# Patient Record
Sex: Female | Born: 1955 | Race: White | Hispanic: No | Marital: Married | Smoking: Former smoker
Health system: Southern US, Community
[De-identification: ages and names within clinical notes are randomized; demographics above are authoritative.]

## PROBLEM LIST (undated history)

## (undated) DIAGNOSIS — Z8679 Personal history of other diseases of the circulatory system: Secondary | ICD-10-CM

## (undated) DIAGNOSIS — Z960 Presence of urogenital implants: Secondary | ICD-10-CM

## (undated) DIAGNOSIS — G4733 Obstructive sleep apnea (adult) (pediatric): Secondary | ICD-10-CM

## (undated) DIAGNOSIS — J449 Chronic obstructive pulmonary disease, unspecified: Secondary | ICD-10-CM

## (undated) DIAGNOSIS — N393 Stress incontinence (female) (male): Secondary | ICD-10-CM

## (undated) DIAGNOSIS — Z9989 Dependence on other enabling machines and devices: Secondary | ICD-10-CM

## (undated) DIAGNOSIS — N189 Chronic kidney disease, unspecified: Secondary | ICD-10-CM

## (undated) DIAGNOSIS — Z8669 Personal history of other diseases of the nervous system and sense organs: Secondary | ICD-10-CM

## (undated) DIAGNOSIS — I10 Essential (primary) hypertension: Secondary | ICD-10-CM

## (undated) DIAGNOSIS — R011 Cardiac murmur, unspecified: Secondary | ICD-10-CM

## (undated) DIAGNOSIS — Z87442 Personal history of urinary calculi: Secondary | ICD-10-CM

## (undated) DIAGNOSIS — Z9889 Other specified postprocedural states: Secondary | ICD-10-CM

## (undated) DIAGNOSIS — E785 Hyperlipidemia, unspecified: Secondary | ICD-10-CM

## (undated) DIAGNOSIS — J4489 Other specified chronic obstructive pulmonary disease: Secondary | ICD-10-CM

## (undated) DIAGNOSIS — M1990 Unspecified osteoarthritis, unspecified site: Secondary | ICD-10-CM

## (undated) DIAGNOSIS — G4730 Sleep apnea, unspecified: Secondary | ICD-10-CM

## (undated) DIAGNOSIS — G40209 Localization-related (focal) (partial) symptomatic epilepsy and epileptic syndromes with complex partial seizures, not intractable, without status epilepticus: Secondary | ICD-10-CM

## (undated) HISTORY — DX: Cardiac murmur, unspecified: R01.1

## (undated) HISTORY — DX: Unspecified osteoarthritis, unspecified site: M19.90

## (undated) HISTORY — PX: CEREBRAL EMBOLIZATION: SHX1327

## (undated) HISTORY — DX: Chronic kidney disease, unspecified: N18.9

## (undated) HISTORY — DX: Essential (primary) hypertension: I10

---

## 1989-06-18 HISTORY — PX: BLADDER SUSPENSION: SHX72

## 1989-06-18 HISTORY — PX: TUBAL LIGATION: SHX77

## 1992-06-18 HISTORY — PX: ANAL FISSURE REPAIR: SHX2312

## 2008-03-22 LAB — HM COLONOSCOPY

## 2012-12-26 ENCOUNTER — Encounter: Payer: Self-pay | Admitting: Physician Assistant

## 2012-12-26 ENCOUNTER — Ambulatory Visit (INDEPENDENT_AMBULATORY_CARE_PROVIDER_SITE_OTHER): Payer: BC Managed Care – PPO | Admitting: Physician Assistant

## 2012-12-26 VITALS — BP 130/75 | HR 74 | Ht 64.0 in | Wt 162.0 lb

## 2012-12-26 DIAGNOSIS — J45909 Unspecified asthma, uncomplicated: Secondary | ICD-10-CM | POA: Insufficient documentation

## 2012-12-26 DIAGNOSIS — J4520 Mild intermittent asthma, uncomplicated: Secondary | ICD-10-CM

## 2012-12-26 DIAGNOSIS — J449 Chronic obstructive pulmonary disease, unspecified: Secondary | ICD-10-CM

## 2012-12-26 MED ORDER — THEOPHYLLINE ER 300 MG PO TB12: 300.0000 mg | ORAL_TABLET | Freq: Two times a day (BID) | ORAL | Status: DC

## 2012-12-26 NOTE — Progress Notes (Signed)
  Subjective:    Patient ID: Marissa Willis, female    DOB: September 12, 1955, 57 y.o.   MRN: [id number]  HPI Patient is a 57 yo female who presents to the clinic to establish care. She does have ongoing COPD/Asthma which to her knowledge been told she has both. She is currently controlled on theophyiline and occasional albuterol. She has tried all inhalers(spiriva/advair/symbicort) out there and per patient have not worked, gotten thrush, or made her feel awful. She feels great on current therapy. She continues to smoke 1 and 1/2 packs a day. She does have clear trigger such as disel fumes, burning leaves. Never seen a pulmonologist.   Pt not had mammogram in years and does not want one. Colonoscopy per patient was done 10/09 and normal.    Review of Systems  All other systems reviewed and are negative.       Objective:   Physical Exam  Constitutional: She is oriented to person, place, and time. She appears well-developed and well-nourished.  HENT:  Head: Normocephalic and atraumatic.  Eyes: Conjunctivae are normal. Right eye exhibits no discharge. Left eye exhibits no discharge.  Neck: Normal range of motion. Neck supple.  Cardiovascular: Normal rate, regular rhythm and normal heart sounds.   Pulmonary/Chest: Effort normal.  Slight wheeze at base of bilateral lungs.  Lymphadenopathy:    She has no cervical adenopathy.  Neurological: She is alert and oriented to person, place, and time.  Skin: Skin is warm and dry.  Psychiatric: She has a normal mood and affect. Her behavior is normal.          Assessment & Plan:  COPD/Asthma- Right now will refill what she has been controlled on. Discussed I do not routinue prescribe theophyline. If stays controlled will refill if not controlled will refer out or consider trying tudorza since she has not tried that medication.  Discussed need for CPE and fasting labs.

## 2013-03-02 ENCOUNTER — Encounter: Payer: Self-pay | Admitting: *Deleted

## 2013-03-02 ENCOUNTER — Encounter: Payer: Self-pay | Admitting: Physician Assistant

## 2013-03-02 ENCOUNTER — Ambulatory Visit (INDEPENDENT_AMBULATORY_CARE_PROVIDER_SITE_OTHER): Payer: BC Managed Care – PPO | Admitting: Physician Assistant

## 2013-03-02 VITALS — BP 135/78 | HR 88 | Wt 164.0 lb

## 2013-03-02 DIAGNOSIS — Z13220 Encounter for screening for lipoid disorders: Secondary | ICD-10-CM

## 2013-03-02 DIAGNOSIS — G40209 Localization-related (focal) (partial) symptomatic epilepsy and epileptic syndromes with complex partial seizures, not intractable, without status epilepticus: Secondary | ICD-10-CM

## 2013-03-02 DIAGNOSIS — Z716 Tobacco abuse counseling: Secondary | ICD-10-CM

## 2013-03-02 DIAGNOSIS — Z131 Encounter for screening for diabetes mellitus: Secondary | ICD-10-CM

## 2013-03-02 DIAGNOSIS — R479 Unspecified speech disturbances: Secondary | ICD-10-CM

## 2013-03-02 NOTE — Progress Notes (Addendum)
  Subjective:    Patient ID: Marissa Willis, female    DOB: 1955/09/04, 57 y.o.   MRN: [id number]  HPI Patient is a 57 year old female who presents to the clinic with the need for neurology referral. Patient has had 2 seizure like event in the past 6 months. The first event was in March and she did not seek any medical intervention. The second event was 2 weeks ago September 5th. She has noticed that both triggers have been when she has masturbated in the shower. She does not have events when she orgasms during sexual intercourse but pt reports that the orgasm is much stronger with self stimulation. She reports that after orgasming she cannot remember anything that happens for hours. Her husband and daughter report that she repeats herself over and over again. She denies any speech slurring, muscle weakness, chest pain, headache or vision changes. Patient was put on Keppra however she has not started taking because she doesn't think she needs its.  She thinks EEG was done but not completely sure. Per pt MRI was done in ER and negative for any masses. ER diagnosed with Complex partial seizure.   EEG-Mild slow wave abnormality in the left temporal region, indicating a slight neurophysiologic distrubance in the left temporal region. No clear epiletiform discharges were noted; therefore no definite evidence was seen for a seizure disorder.   MRI head with contrast- No infarct of masses.     Review of Systems     Objective:   Physical Exam  Constitutional: She is oriented to person, place, and time. She appears well-developed and well-nourished.  HENT:  Head: Normocephalic and atraumatic.  Eyes: Conjunctivae and EOM are normal. Pupils are equal, round, and reactive to light.  Cardiovascular: Normal rate, regular rhythm and normal heart sounds.   Pulmonary/Chest: Effort normal and breath sounds normal. She has no wheezes.  Musculoskeletal: Normal range of motion.  Bilateral upper and lower  extremity 5/5 bilaterally.   Neurological: She is alert and oriented to person, place, and time. She has normal reflexes. No cranial nerve deficit. Coordination normal.  Skin: Skin is warm and dry.  Psychiatric: She has a normal mood and affect. Her behavior is normal.          Assessment & Plan:  Complex, partial seizure- I do think pt needs to be seen by neurologist will refer. I advised pt not to drive until released from neurologist. I do think pt needs EEG will seek records and send to Neurologist when I get.  I cannot completely rule out TIA. Will check lipid level, screen for diabetes, get carotid dopplers. BP after rechecking was fine. Discussed with pt how treatment for TIA are to prevent stroke. Recommended ASA daily. GAve HO on TIA's.   Tobacco abuse counseling was given. Pt declined and stated she was not ready to quit. Discussed it could be effecting BP, cholesterol, as well as other issues.

## 2013-03-02 NOTE — Patient Instructions (Addendum)

## 2013-03-03 LAB — COMPLETE METABOLIC PANEL WITH GFR
Albumin: 4.2 g/dL (ref 3.5–5.2)
CO2: 29 mEq/L (ref 19–32)
Calcium: 10 mg/dL (ref 8.4–10.5)
Chloride: 107 mEq/L (ref 96–112)
GFR, Est African American: 80 mL/min
GFR, Est Non African American: 69 mL/min
Glucose, Bld: 87 mg/dL (ref 70–99)
Potassium: 4.4 mEq/L (ref 3.5–5.3)
Sodium: 140 mEq/L (ref 135–145)
Total Bilirubin: 0.5 mg/dL (ref 0.3–1.2)
Total Protein: 7.1 g/dL (ref 6.0–8.3)

## 2013-03-03 LAB — LIPID PANEL: HDL: 46 mg/dL (ref >39)

## 2013-03-05 ENCOUNTER — Ambulatory Visit (HOSPITAL_BASED_OUTPATIENT_CLINIC_OR_DEPARTMENT_OTHER)
Admission: RE | Admit: 2013-03-05 | Discharge: 2013-03-05 | Disposition: A | Discharge status: Home / Self Care | Payer: BC Managed Care – PPO | Source: Ambulatory Visit | Attending: Physician Assistant | Admitting: Physician Assistant

## 2013-03-05 ENCOUNTER — Ambulatory Visit (HOSPITAL_BASED_OUTPATIENT_CLINIC_OR_DEPARTMENT_OTHER): Payer: BC Managed Care – PPO

## 2013-03-05 DIAGNOSIS — R4789 Other speech disturbances: Secondary | ICD-10-CM | POA: Insufficient documentation

## 2013-03-05 DIAGNOSIS — G40209 Localization-related (focal) (partial) symptomatic epilepsy and epileptic syndromes with complex partial seizures, not intractable, without status epilepticus: Secondary | ICD-10-CM | POA: Insufficient documentation

## 2013-03-05 DIAGNOSIS — F17200 Nicotine dependence, unspecified, uncomplicated: Secondary | ICD-10-CM | POA: Insufficient documentation

## 2013-03-05 DIAGNOSIS — R479 Unspecified speech disturbances: Secondary | ICD-10-CM

## 2013-03-16 ENCOUNTER — Encounter: Payer: Self-pay | Admitting: Physician Assistant

## 2013-03-16 ENCOUNTER — Encounter: Payer: Self-pay | Admitting: Neurology

## 2013-03-16 ENCOUNTER — Ambulatory Visit (INDEPENDENT_AMBULATORY_CARE_PROVIDER_SITE_OTHER): Payer: BC Managed Care – PPO | Admitting: Neurology

## 2013-03-16 VITALS — BP 135/85 | HR 70 | Temp 98.1°F | Ht 65.0 in | Wt 162.0 lb

## 2013-03-16 DIAGNOSIS — J449 Chronic obstructive pulmonary disease, unspecified: Secondary | ICD-10-CM

## 2013-03-16 DIAGNOSIS — G40209 Localization-related (focal) (partial) symptomatic epilepsy and epileptic syndromes with complex partial seizures, not intractable, without status epilepticus: Secondary | ICD-10-CM

## 2013-03-16 DIAGNOSIS — J45909 Unspecified asthma, uncomplicated: Secondary | ICD-10-CM

## 2013-03-16 NOTE — Progress Notes (Signed)
[facility]  PATIENT: Marissa Willis DOB: April 30, 1956  HISTORICAL  Marissa Willis is a 57 years old right-handed Caucasian female, referred by her primary care physician Jeralene Peters for evaluation of possible complex partial seizure with secondary generalization.   She presented with confusion in 2011, was diagnosed with left brain aneurysm, also suffered a left-sided brain stroke per patient, she underwent endovascular aneurysm embolization procedure at [facility], recovered very well,  She suffered 2 episodes of prolonged confusion, loss of memory, very similar to each other. The first episode was in March 2014, the second episode was in September 5th, each episode happened after she took a shower and masturbated in the shower, she report prolonged confusion episodes, repeating the question, each episode lasting more than 4 hours, she is completely lucid a next day, she was evaluated at [facility] in September, I do not have the record, but from the referring., There was mild abnormality on the EEG, mild slowing at the left temporal region, but no definite epileptiform discharge,  She was diagnosed with complex partial seizure, was getting the prescription of Keppra 500 mg twice a day, but she worry about the side effect, does not want to take the medications  At baseline, she denies gait difficulty, no language difficulties, 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 albuterol (PROVENTIL HFA;VENTOLIN HFA) 108 (90 BASE) MCG/ACT inhaler Inhale 2 puffs into the lungs every 6 (six) hours as needed for wheezing.      . theophylline (THEODUR) 300 MG 12 hr tablet Take 1 tablet (300  mg total) by mouth 2 (two) times daily.  60 tablet  5   No facility-administered medications prior to visit.    PAST MEDICAL HISTORY: Past Medical History  Diagnosis Date  . Asthma   . COPD (chronic obstructive pulmonary disease)     PAST SURGICAL HISTORY: Past Surgical History  Procedure Laterality Date  . Tubal ligation    . Incontinence surgery  06/18/89  . 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 Coiling brain an      FAMILY HISTORY: No family history on file.  SOCIAL HISTORY:  History   Social History  . Marital Status: Married    Spouse Name: N/A    Number of Children: N/A  . Years of Education: N/A   Occupational History  . Not on file.   Social History Main Topics  . Smoking status: Current Every Day Smoker -- 1.50 packs/day for 40 years    Types: Cigarettes  . Smokeless tobacco: Never Used  . Alcohol Use: Not on file  . Drug Use: No  . Sexual Activity: Not on file   Other Topics Concern  . Not on file   Social History Narrative   Lives at home with husband and 9 yr old mother.   2 daughters   Pt is Rt handed     PHYSICAL EXAM   Filed Vitals:   03/16/13 1014  BP: 135/85  Pulse: 70  Temp: 98.1 F (36.7 C)  TempSrc: Oral  Height: 5\' 5"  (1.651 m)  Weight: 162 lb (73.483 kg)     Body mass index is 26.96 kg/(m^2).  Generalized: In no acute distress  Neck: Supple, no carotid bruits   Cardiac: Regular rate rhythm  Pulmonary: Clear to auscultation bilaterally  Musculoskeletal: No deformity  Neurological examination  Mentation: Alert oriented to time, place, history taking, and causual conversation  Cranial nerve II-XII: Pupils were equal round reactive to light extraocular movements were full, visual field were full on confrontational test. facial sensation and strength were normal. hearing was intact to finger rubbing bilaterally. Uvula tongue midline.  head turning and shoulder shrug and were  normal and symmetric.Tongue protrusion into cheek strength was normal.  Motor: normal tone, bulk and strength.  Sensory: Intact to fine touch, pinprick, preserved vibratory sensation, and proprioception at toes.  Coordination: Normal finger to nose, heel-to-shin bilaterally there was no truncal ataxia  Gait: Rising up from seated position without assistance, normal stance, without trunk ataxia, moderate stride, good arm swing, smooth turning, able to perform tiptoe, and heel walking without difficulty.   Romberg signs: Negative  Deep tendon reflexes: Brachioradialis 2/2, biceps 2/2, triceps 2/2, patellar 2/2, Achilles 2/2, plantar responses were flexor bilaterally.   DIAGNOSTIC DATA (LABS, IMAGING, TESTING) - I reviewed patient records, labs, notes, testing and imaging myself where available.  No results found for this basename: WBC, HGB, HCT, MCV, PLT      Component Value Date/Time   NA 140 03/03/2013 0950   K 4.4 03/03/2013 0950   CL 107 03/03/2013 0950   CO2 29 03/03/2013 0950   GLUCOSE 87 03/03/2013 0950   BUN 17 03/03/2013 0950   CREATININE 0.91 03/03/2013 0950   CALCIUM 10.0 03/03/2013 0950   PROT 7.1 03/03/2013 0950   ALBUMIN 4.2 03/03/2013 0950   AST 17 03/03/2013 0950   ALT 16 03/03/2013 0950   ALKPHOS 87 03/03/2013 0950   BILITOT 0.5 03/03/2013 0950   Lab Results  Component Value Date   CHOL 211* 03/03/2013   HDL 46 03/03/2013   LDLCALC 141* 03/03/2013   TRIG 122 03/03/2013   CHOLHDL 4.6 03/03/2013     ASSESSMENT AND PLAN   57 Years old right-handed Caucasian female, with past medical history of left sided aneurysm, status post aneurysm clipping, possible left-sided stroke, presenting with prolonged episode of confusion, with reported abnormal EEG, EEG-Mild slow wave abnormality in the left temporal region, indicating a slight neurophysiologic distrubance in the left temporal region. No clear epiletiform discharges were noted; most suggestive of complex partial seizure.   1.   Will start treatment with Keppra 500 twice a day 2.  repeat EEG 3. return to clinic in 1-2 months   Levert Feinstein, M.D. Ph.D.  [facility] [address] [place], [place] [address] [phone]

## 2013-03-23 ENCOUNTER — Ambulatory Visit (INDEPENDENT_AMBULATORY_CARE_PROVIDER_SITE_OTHER): Payer: BC Managed Care – PPO | Admitting: Radiology

## 2013-03-23 DIAGNOSIS — G40209 Localization-related (focal) (partial) symptomatic epilepsy and epileptic syndromes with complex partial seizures, not intractable, without status epilepticus: Secondary | ICD-10-CM

## 2013-03-23 DIAGNOSIS — J45909 Unspecified asthma, uncomplicated: Secondary | ICD-10-CM

## 2013-03-23 DIAGNOSIS — J449 Chronic obstructive pulmonary disease, unspecified: Secondary | ICD-10-CM

## 2013-03-24 NOTE — Procedures (Signed)
HISTORY: 57 years old female, presenting with prolonged episode of confusion in March 2014, and September 2014, reported abnormal EEG showed mild slowing, and abnormality in the left temporal region, he had a history of left brain aneurysm diagnosed in 2011,  TECHNIQUE:  16 channel EEG was performed based on standard 10-16 international system. One channel was dedicated to EKG, which has demonstrates normal sinus rhythm of beats per minutes.  Upon awakening, the posterior background activity was well-developed, in alpha range, 10 Hz with amplitude of 80 microvoltage, reactive to eye opening and closure.  There was no evidence of epilepsy for discharge.  Photic stimulation was performed, which induced a symmetric photic driving.  Hyperventilation was not performed  No sleep was achieved.  CONCLUSION: This is a  normal awake EEG.  There is no electrodiagnostic evidence of epileptiform discharge

## 2013-03-30 ENCOUNTER — Encounter: Payer: BC Managed Care – PPO | Admitting: Physician Assistant

## 2013-04-06 ENCOUNTER — Ambulatory Visit (INDEPENDENT_AMBULATORY_CARE_PROVIDER_SITE_OTHER): Payer: BC Managed Care – PPO | Admitting: Physician Assistant

## 2013-04-06 ENCOUNTER — Encounter: Payer: Self-pay | Admitting: Physician Assistant

## 2013-04-06 VITALS — BP 133/85 | HR 69 | Wt 164.0 lb

## 2013-04-06 DIAGNOSIS — Z720 Tobacco use: Secondary | ICD-10-CM

## 2013-04-06 DIAGNOSIS — F17200 Nicotine dependence, unspecified, uncomplicated: Secondary | ICD-10-CM

## 2013-04-06 DIAGNOSIS — G40209 Localization-related (focal) (partial) symptomatic epilepsy and epileptic syndromes with complex partial seizures, not intractable, without status epilepticus: Secondary | ICD-10-CM

## 2013-04-06 DIAGNOSIS — Z23 Encounter for immunization: Secondary | ICD-10-CM

## 2013-04-06 DIAGNOSIS — Z0000 Encounter for general adult medical examination without abnormal findings: Secondary | ICD-10-CM

## 2013-04-06 NOTE — Patient Instructions (Signed)

## 2013-04-06 NOTE — Progress Notes (Signed)
Subjective:     Marissa Willis is a 57 y.o. female and is here for a comprehensive physical exam. The patient reports no problems.  History   Social History  . Marital Status: Married    Spouse Name: N/A    Number of Children: N/A  . Years of Education: N/A   Occupational History  . Not on file.   Social History Main Topics  . Smoking status: Current Every Day Smoker -- 1.50 packs/day for 40 years    Types: Cigarettes  . Smokeless tobacco: Never Used  . Alcohol Use: Not on file  . Drug Use: No  . Sexual Activity: Not on file   Other Topics Concern  . Not on file   Social History Narrative   Lives at home with husband and 50 yr old mother.   2 daughters   Pt is Rt handed   Health Maintenance  Topic Date Due  . Mammogram  04/06/2014  . Pap Smear  04/07/2023  . Influenza Vaccine  01/16/2014  . Colonoscopy  03/18/2018  . Tetanus/tdap  06/18/2018    The following portions of the patient's history were reviewed and updated as appropriate: allergies, current medications, past family history, past medical history, past social history, past surgical history and problem list.  Review of Systems A comprehensive review of systems was negative.   Objective:    BP 133/85  Pulse 69  Wt 164 lb (74.39 kg)  BMI 27.29 kg/m2 General appearance: alert, cooperative and appears stated age Head: Normocephalic, without obvious abnormality, atraumatic Eyes: conjunctivae/corneas clear. PERRL, EOM's intact. Fundi benign. Ears: normal TM's and external ear canals both ears Nose: Nares normal. Septum midline. Mucosa normal. No drainage or sinus tenderness. Throat: lips, mucosa, and tongue normal; teeth and gums normal Neck: no adenopathy, no carotid bruit, no JVD, supple, symmetrical, trachea midline and thyroid not enlarged, symmetric, no tenderness/mass/nodules Back: symmetric, no curvature. ROM normal. No CVA tenderness. Lungs: bilateral upper and lower lung rhonchi and wheezing.  coarse breath sounds. Breasts: normal appearance, no masses or tenderness Heart: regular rate and rhythm, S1, S2 normal, no murmur, click, rub or gallop Abdomen: soft, non-tender; bowel sounds normal; no masses,  no organomegaly Extremities: extremities normal, atraumatic, no cyanosis or edema Pulses: 2+ and symmetric Skin: Skin color, texture, turgor normal. No rashes or lesions Lymph nodes: Cervical, supraclavicular, and axillary nodes normal. Neurologic: Grossly normal    Assessment:    Healthy female exam.      Plan:      CPE- Flu shot given today. Discussed need for pap and mammogram. Discussed risk for both. She is aware of risk of breast cancer and its treatable nature. Pt declined mammogram and pap smear. Per patient colonoscopy is up to date. Fasting labs already done. LDL 143. Discussed low fat diet and exercise. Pt aware. Will continue to monitor. All other bloodwork looked fine. Recommended calicum and vitamin D 1200mg /800mg  daily. Gave HO on CPE issues.   Tobacco abuse/wheezing on auscultation- discussed with patient lungs did not sound good. Denies any SOB or not being able to do anything she wants to do. Suggested spioronmetry. Pt declined at this time. She will continue to use albuterol inhaler as needed. Pt does want to stop smoking. Not able to tolerate Chantix. Wellbutrin did not work. She is cutting back now and then just going to quit.   Complex partial seizure- pt not taking keppra. Has not had another event. Last EEG was completely normal. Will followup with neurologist  in 2 months.    See After Visit Summary for Counseling Recommendations

## 2013-04-20 ENCOUNTER — Encounter: Payer: Self-pay | Admitting: Neurology

## 2013-04-20 ENCOUNTER — Ambulatory Visit (INDEPENDENT_AMBULATORY_CARE_PROVIDER_SITE_OTHER): Payer: BC Managed Care – PPO | Admitting: Neurology

## 2013-04-20 VITALS — BP 140/89 | HR 66 | Ht 63.0 in | Wt 162.0 lb

## 2013-04-20 DIAGNOSIS — J449 Chronic obstructive pulmonary disease, unspecified: Secondary | ICD-10-CM

## 2013-04-20 DIAGNOSIS — G40209 Localization-related (focal) (partial) symptomatic epilepsy and epileptic syndromes with complex partial seizures, not intractable, without status epilepticus: Secondary | ICD-10-CM

## 2013-04-20 MED ORDER — LEVETIRACETAM 500 MG PO TABS: 500.0000 mg | ORAL_TABLET | Freq: Two times a day (BID) | ORAL | Status: DC

## 2013-04-20 NOTE — Progress Notes (Signed)
[facility]  PATIENT: Marissa Willis DOB: 26-Nov-1955  HISTORICAL  Marissa Willis is a 57 years old right-handed Caucasian female, referred by her primary care physician Jeralene Peters for evaluation of possible complex partial seizure with secondary generalization.   She presented with confusion in 2011, was diagnosed with left brain aneurysm, also suffered a left-sided brain stroke per patient, she underwent endovascular aneurysm embolization procedure at [facility], recovered very well,  She suffered 2 episodes of prolonged confusion, loss of memory, very similar to each other. The first episode was in March 2014, the second episode was in September 5th, each episode happened after she took a shower and masturbated in the shower, she report prolonged confusion episodes, repeating the question, each episode lasting more than 4 hours, she is completely lucid a next day, she was evaluated at [facility] in September, I do not have the record, but from the referring., There was mild abnormality on the EEG, mild slowing at the left temporal region, but no definite epileptiform discharge,  She was diagnosed with complex partial seizure, was getting the prescription of Keppra 500 mg twice a day, but she worry about the side effect, does not want to take the medications  At baseline, she denies gait difficulty, no language difficulties, 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 she was not able to get her Keppra filled  REVIEW OF SYSTEMS: Full 14 system review of systems performed and notable only for ringing in ears, memory loss, dizziness,  ALLERGIES: No Known Allergies  HOME MEDICATIONS: Outpatient Prescriptions Prior to Visit  Medication Sig Dispense Refill  . albuterol (PROVENTIL HFA;VENTOLIN HFA) 108 (90 BASE) MCG/ACT inhaler Inhale 2  puffs into the lungs every 6 (six) hours as needed for wheezing.      [name] aspirin 81 MG tablet Take 81 mg by mouth daily.      . theophylline (THEODUR) 300 MG 12 hr tablet Take 1 tablet (300 mg total) by mouth 2 (two) times daily.  60 tablet  5   No facility-administered medications prior to visit.    PAST MEDICAL HISTORY: Past Medical History  Diagnosis Date  . Asthma   . COPD (chronic obstructive pulmonary disease)     PAST SURGICAL HISTORY: Past Surgical History  Procedure Laterality Date  . Tubal ligation    . Incontinence surgery  06/18/89  . 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 Coiling brain an      FAMILY HISTORY: Family History  Problem Relation Age of Onset  . Diabetes Mother   . Heart disease Mother   . Hypertension Mother   . Heart disease Father     SOCIAL HISTORY:  History   Social History  . Marital Status: Married    Spouse Name: Raiford Noble    Number of Children: 2  . Years of Education: 14   Occupational History  . Not on file.   Social History Main Topics  . Smoking status: Current Every Day Smoker -- 1.50 packs/day for 40 years    Types: Cigarettes  . Smokeless tobacco: Never Used  . Alcohol Use: 0.0 oz/week     Comment: 1-2 drinks daily  . Drug Use: Yes     Comment: marijuara  . Sexual Activity: Not on file   Other Topics Concern  . Not on file  Social History Narrative   Lives at home with husband Raiford Noble ) and 59 yr old mother.   2 daughters   Pt is Rt handed   Patient has a college education.   Patient drinks a cup of coffee daily and occasionally tea.     PHYSICAL EXAM   Filed Vitals:   04/20/13 1026  BP: 140/89  Pulse: 66  Height: 5\' 3"  (1.6 m)  Weight: 162 lb (73.483 kg)     Body mass index is 28.7 kg/(m^2).   Generalized: In no acute distress  Neck: Supple, no carotid bruits   Cardiac: Regular rate rhythm  Pulmonary: Clear to auscultation bilaterally  Musculoskeletal: No  deformity  Neurological examination  Mentation: Alert oriented to time, place, history taking, and causual conversation  Cranial nerve II-XII: Pupils were equal round reactive to light extraocular movements were full, visual field were full on confrontational test. facial sensation and strength were normal. hearing was intact to finger rubbing bilaterally. Uvula tongue midline.  head turning and shoulder shrug and were normal and symmetric.Tongue protrusion into cheek strength was normal.  Motor: normal tone, bulk and strength.  Sensory: Intact to fine touch, pinprick, preserved vibratory sensation, and proprioception at toes.  Coordination: Normal finger to nose, heel-to-shin bilaterally there was no truncal ataxia  Gait: Rising up from seated position without assistance, normal stance, without trunk ataxia, moderate stride, good arm swing, smooth turning, able to perform tiptoe, and heel walking without difficulty.   Romberg signs: Negative  Deep tendon reflexes: Brachioradialis 2/2, biceps 2/2, triceps 2/2, patellar 2/2, Achilles 2/2, plantar responses were flexor bilaterally.   DIAGNOSTIC DATA (LABS, IMAGING, TESTING) - I reviewed patient records, labs, notes, testing and imaging myself where available.  No results found for this basename: WBC,  HGB,  HCT,  MCV,  PLT      Component Value Date/Time   NA 140 03/03/2013 0950   K 4.4 03/03/2013 0950   CL 107 03/03/2013 0950   CO2 29 03/03/2013 0950   GLUCOSE 87 03/03/2013 0950   BUN 17 03/03/2013 0950   CREATININE 0.91 03/03/2013 0950   CALCIUM 10.0 03/03/2013 0950   PROT 7.1 03/03/2013 0950   ALBUMIN 4.2 03/03/2013 0950   AST 17 03/03/2013 0950   ALT 16 03/03/2013 0950   ALKPHOS 87 03/03/2013 0950   BILITOT 0.5 03/03/2013 0950   Lab Results  Component Value Date   CHOL 211* 03/03/2013   HDL 46 03/03/2013   LDLCALC 141* 03/03/2013   TRIG 122 03/03/2013   CHOLHDL 4.6 03/03/2013     ASSESSMENT AND PLAN   57 Years old right-handed  Caucasian female, with past medical history of left sided aneurysm, status post aneurysm clipping, possible left-sided stroke, presenting with prolonged episode of confusion, with reported abnormal EEG, EEG-Mild slow wave abnormality in the left temporal region, indicating a slight neurophysiologic distrubance in the left temporal region. No clear epiletiform discharges were noted; most suggestive of complex partial seizure.   1.  Will start treatment with Keppra 500 twice a day 2.   return to clinic in 6 months   Levert Feinstein, M.D. Ph.D.  [facility] [address] [place], [place] [address] [phone]

## 2013-05-21 ENCOUNTER — Emergency Department
Admission: EM | Admit: 2013-05-21 | Discharge: 2013-05-21 | Disposition: A | Discharge status: Home / Self Care | Payer: BC Managed Care – PPO | Source: Home / Self Care | Attending: Family Medicine | Admitting: Family Medicine

## 2013-05-21 ENCOUNTER — Encounter: Payer: Self-pay | Admitting: Emergency Medicine

## 2013-05-21 ENCOUNTER — Telehealth: Payer: Self-pay | Admitting: Emergency Medicine

## 2013-05-21 ENCOUNTER — Emergency Department (INDEPENDENT_AMBULATORY_CARE_PROVIDER_SITE_OTHER): Payer: BC Managed Care – PPO

## 2013-05-21 DIAGNOSIS — R0602 Shortness of breath: Secondary | ICD-10-CM

## 2013-05-21 DIAGNOSIS — J441 Chronic obstructive pulmonary disease with (acute) exacerbation: Secondary | ICD-10-CM

## 2013-05-21 DIAGNOSIS — J4489 Other specified chronic obstructive pulmonary disease: Secondary | ICD-10-CM

## 2013-05-21 DIAGNOSIS — Z716 Tobacco abuse counseling: Secondary | ICD-10-CM

## 2013-05-21 DIAGNOSIS — J069 Acute upper respiratory infection, unspecified: Secondary | ICD-10-CM

## 2013-05-21 DIAGNOSIS — J449 Chronic obstructive pulmonary disease, unspecified: Secondary | ICD-10-CM

## 2013-05-21 MED ORDER — IPRATROPIUM-ALBUTEROL 0.5-2.5 (3) MG/3ML IN SOLN
3.0000 mL | Freq: Once | RESPIRATORY_TRACT | Status: AC
  Administered 2013-05-21: 3 mL via RESPIRATORY_TRACT

## 2013-05-21 MED ORDER — DOXYCYCLINE HYCLATE 100 MG PO CAPS: 100.0000 mg | ORAL_CAPSULE | Freq: Two times a day (BID) | ORAL | Status: AC

## 2013-05-21 MED ORDER — METHYLPREDNISOLONE SODIUM SUCC 125 MG IJ SOLR
125.0000 mg | Freq: Once | INTRAMUSCULAR | Status: AC
  Administered 2013-05-21: 125 mg via INTRAMUSCULAR

## 2013-05-21 MED ORDER — PREDNISONE 50 MG PO TABS: ORAL_TABLET | ORAL | Status: DC

## 2013-05-21 NOTE — ED Provider Notes (Signed)
CSN: [id number]     Arrival date & time 05/21/13  1425 History   First MD Initiated Contact with Patient 05/21/13 1429     Chief Complaint  Patient presents with  . Sore Throat    HPI  URI Symptoms Onset: 4-5 days  Description: rhinorrhea, nasal congestion, sore throat, increased sputum production, cough, wheezing  Modifying factors:  1 1/2 PPD smoker, baseline COPD  Symptoms Nasal discharge: yes Fever: no Sore throat: yes Cough: yes Wheezing: yes Ear pain: no GI symptoms: no Sick contacts: no  Red Flags  Stiff neck: no Dyspnea: chest tightness  Rash: n Swallowing difficulty: no  Sinusitis Risk Factors Headache/face pain: no Double sickening: no tooth pain: no  Allergy Risk Factors Sneezing: no Itchy scratchy throat: no Seasonal symptoms: no  Flu Risk Factors Headache: no muscle aches: no severe fatigue: no   Past Medical History  Diagnosis Date  . Asthma   . COPD (chronic obstructive pulmonary disease)    Past Surgical History  Procedure Laterality Date  . Tubal ligation    . Incontinence surgery  06/18/89  . 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 Coiling brain an     Family History  Problem Relation Age of Onset  . Diabetes Mother   . Heart disease Mother   . Hypertension Mother   . Heart disease Father    History  Substance Use Topics  . Smoking status: Current Every Day Smoker -- 1.50 packs/day for 45 years    Types: Cigarettes  . Smokeless tobacco: Never Used  . Alcohol Use: 0.0 oz/week     Comment: 1-2 drinks daily   OB History   Grav Para Term Preterm Abortions TAB SAB Ect Mult Living                 Review of Systems  All other systems reviewed and are negative.    Allergies  Review of patient's allergies indicates no known allergies.  Home Medications   Current Outpatient Rx  Name  Route  Sig  Dispense  Refill  . albuterol (PROVENTIL HFA;VENTOLIN HFA) 108 (90 BASE) MCG/ACT inhaler  Inhalation   Inhale 2 puffs into the lungs every 6 (six) hours as needed for wheezing.         [name] aspirin 81 MG tablet   Oral   Take 81 mg by mouth daily.         [name] levETIRAcetam (KEPPRA) 500 MG tablet   Oral   Take 1 tablet (500 mg total) by mouth 2 (two) times daily.   60 tablet   12   . theophylline (THEODUR) 300 MG 12 hr tablet   Oral   Take 1 tablet (300 mg total) by mouth 2 (two) times daily.   60 tablet   5    BP 126/86  Pulse 93  Temp(Src) 98.5 F (36.9 C) (Oral)  Ht 5\' 4"  (1.626 m)  Wt 163 lb (73.936 kg)  BMI 27.97 kg/m2  SpO2 95% Physical Exam  Constitutional: She is oriented to person, place, and time.  Appears older than stated age   HENT:  Head: Normocephalic and atraumatic.  Eyes: Conjunctivae are normal. Pupils are equal, round, and reactive to light.  Neck: Normal range of motion. Neck supple.  Cardiovascular: Normal rate and normal heart sounds.   Pulmonary/Chest: Effort normal. She has wheezes. She has rales.  Abdominal: Soft.  Musculoskeletal: Normal range of motion.  Neurological: She is alert  and oriented to person, place, and time.  Skin: Skin is warm.    ED Course  Procedures (including critical care time) Labs Review Labs Reviewed - No data to display Imaging Review Dg Chest 2 View  05/21/2013   CLINICAL DATA:  Shortness of breath, wheezing, COPD, smoker, asthma  EXAM: CHEST  2 VIEW  COMPARISON:  07/16/2004  FINDINGS: Normal heart size and pulmonary vascularity.  Minimally tortuous thoracic aorta.  Lungs appear emphysematous but clear.  No pleural effusion or pneumothorax.  No acute osseous findings.  IMPRESSION: COPD changes.  No acute abnormalities.   Electronically Signed   By: Ulyses Southward M.D.   On: 05/21/2013 15:49      MDM   1. URI (upper respiratory infection)   2. COPD exacerbation    Viral URI with secondary COPD exacerbation Duoneb x1  Solumedrol 125mg  IM x1 Noted improvement in aeration s/p duoneb CXR with no acute  changes apart from chronic COPD.  Will place on course of prednisone and doxy for COPD exacerbation coverage.  Discussed smoking cessation at length Discussed infectious and resp red flags at length Follow up with PCP in 5-7 days.  Follow up as needed.     The patient and/or caregiver has been counseled thoroughly with regard to treatment plan and/or medications prescribed including dosage, schedule, interactions, rationale for use, and possible side effects and they verbalize understanding. Diagnoses and expected course of recovery discussed and will return if not improved as expected or if the condition worsens. Patient and/or caregiver verbalized understanding.           Doree Albee, MD 05/21/13 1600

## 2013-05-21 NOTE — ED Notes (Signed)
Sore throat, ears hurt, fever, chills, green mucus, cough x 5 days

## 2013-05-24 ENCOUNTER — Telehealth: Payer: Self-pay

## 2013-05-24 NOTE — ED Notes (Signed)
Left a message on voice mail asking how patient is feeling and advising to call back with any questions or concerns.  

## 2013-07-19 ENCOUNTER — Other Ambulatory Visit: Payer: Self-pay | Admitting: Physician Assistant

## 2013-08-14 ENCOUNTER — Telehealth: Payer: Self-pay | Admitting: Physician Assistant

## 2013-08-14 ENCOUNTER — Other Ambulatory Visit: Payer: Self-pay | Admitting: *Deleted

## 2013-08-14 MED ORDER — THEOPHYLLINE ER 300 MG PO TB12
ORAL_TABLET | ORAL | Status: DC
Start: 2013-08-14 — End: 2013-09-21

## 2013-08-14 MED ORDER — ALBUTEROL SULFATE HFA 108 (90 BASE) MCG/ACT IN AERS: 2.0000 | INHALATION_SPRAY | Freq: Four times a day (QID) | RESPIRATORY_TRACT | Status: DC | PRN

## 2013-08-14 NOTE — Telephone Encounter (Signed)
Pt notified of rx. 

## 2013-08-14 NOTE — Telephone Encounter (Signed)
Patient called and left a voicemail request to know if she can have a refill prescription for her inhaler. [phone] Thanks

## 2013-09-21 ENCOUNTER — Other Ambulatory Visit: Payer: Self-pay | Admitting: *Deleted

## 2013-09-21 MED ORDER — THEOPHYLLINE ER 300 MG PO TB12: ORAL_TABLET | ORAL | Status: DC

## 2013-10-19 ENCOUNTER — Ambulatory Visit: Payer: BC Managed Care – PPO | Admitting: Nurse Practitioner

## 2013-11-16 ENCOUNTER — Other Ambulatory Visit: Payer: Self-pay | Admitting: Physician Assistant

## 2013-12-09 ENCOUNTER — Ambulatory Visit (INDEPENDENT_AMBULATORY_CARE_PROVIDER_SITE_OTHER): Payer: BC Managed Care – PPO | Admitting: Physician Assistant

## 2013-12-09 ENCOUNTER — Encounter: Payer: Self-pay | Admitting: Physician Assistant

## 2013-12-09 VITALS — BP 131/83 | HR 68 | Ht 64.0 in | Wt 165.0 lb

## 2013-12-09 DIAGNOSIS — Z87891 Personal history of nicotine dependence: Secondary | ICD-10-CM | POA: Insufficient documentation

## 2013-12-09 DIAGNOSIS — E782 Mixed hyperlipidemia: Secondary | ICD-10-CM | POA: Insufficient documentation

## 2013-12-09 DIAGNOSIS — Z131 Encounter for screening for diabetes mellitus: Secondary | ICD-10-CM

## 2013-12-09 DIAGNOSIS — J411 Mucopurulent chronic bronchitis: Secondary | ICD-10-CM

## 2013-12-09 DIAGNOSIS — E785 Hyperlipidemia, unspecified: Secondary | ICD-10-CM

## 2013-12-09 DIAGNOSIS — Z23 Encounter for immunization: Secondary | ICD-10-CM

## 2013-12-09 DIAGNOSIS — Z0000 Encounter for general adult medical examination without abnormal findings: Secondary | ICD-10-CM

## 2013-12-09 MED ORDER — THEOPHYLLINE ER 300 MG PO TB12: ORAL_TABLET | ORAL | Status: DC

## 2013-12-09 MED ORDER — ALBUTEROL SULFATE 108 (90 BASE) MCG/ACT IN AEPB
2.0000 | INHALATION_SPRAY | Freq: Four times a day (QID) | RESPIRATORY_TRACT | Status: DC | PRN
Start: 2013-12-09 — End: 2014-12-28

## 2013-12-09 NOTE — Patient Instructions (Signed)

## 2013-12-09 NOTE — Progress Notes (Signed)
Subjective:     Marissa Willis is a 58 y.o. female and is here for a comprehensive physical exam. The patient reports no problems.  History   Social History  . Marital Status: Married    Spouse Name: Liliane Channel    Number of Children: 2  . Years of Education: 14   Occupational History  . Not on file.   Social History Main Topics  . Smoking status: Former Smoker -- 1.50 packs/day for 45 years    Types: Cigarettes    Start date: 07/20/2013  . Smokeless tobacco: Never Used  . Alcohol Use: 0.0 oz/week     Comment: 1-2 drinks daily  . Drug Use: Yes     Comment: marijuara  . Sexual Activity: Not on file   Other Topics Concern  . Not on file   Social History Narrative   Lives at home with husband Liliane Channel ) and 14 yr old mother.   2 daughters   Pt is Rt handed   Patient has a college education.   Patient drinks a cup of coffee daily and occasionally tea.   Health Maintenance  Topic Date Due  . Mammogram  04/06/2014 (Originally 11/16/2008)  . Pap Smear  04/07/2023 (Originally 06/18/2008)  . Influenza Vaccine  01/16/2014  . Colonoscopy  03/18/2018  . Tetanus/tdap  06/18/2018  . Zostavax  Completed    The following portions of the patient's history were reviewed and updated as appropriate: allergies, current medications, past family history, past medical history, past social history, past surgical history and problem list.  Review of Systems A comprehensive review of systems was negative.   Objective:    BP 131/83  Pulse 68  Ht 5\' 4"  (1.626 m)  Wt 165 lb (74.844 kg)  BMI 28.31 kg/m2 General appearance: alert, cooperative and appears stated age Head: Normocephalic, without obvious abnormality, atraumatic Eyes: conjunctivae/corneas clear. PERRL, EOM's intact. Fundi benign. Ears: normal TM's and external ear canals both ears Nose: Nares normal. Septum midline. Mucosa normal. No drainage or sinus tenderness. Throat: lips, mucosa, and tongue normal; teeth and gums normal Neck:  no adenopathy, no carotid bruit, no JVD, supple, symmetrical, trachea midline and thyroid not enlarged, symmetric, no tenderness/mass/nodules Back: symmetric, no curvature. ROM normal. No CVA tenderness. Lungs: coarse breath sounds bilaterally. Heart: regular rate and rhythm, S1, S2 normal, no murmur, click, rub or gallop Abdomen: soft, non-tender; bowel sounds normal; no masses,  no organomegaly Extremities: extremities normal, atraumatic, no cyanosis or edema Pulses: 2+ and symmetric Skin: Skin color, texture, turgor normal. No rashes or lesions Lymph nodes: Cervical, supraclavicular, and axillary nodes normal. Neurologic: Grossly normal    Assessment:    Healthy female exam.      Plan:    CPE- pt declines mammogram. Pt declines pap smear.  Zostavax given today. UTD on other vaccines. UTD colonoscopy. Will recheck lipid and screen for diabetes. Discussed calcium 1200mg  daily and vitamin D 800 units. Discuss bone density due to smoking hx. Pt declined. Encourage healthy diet and regular exercise at least 158minutes a week.   COPD- refilled theophylline. Gave metered inhaler to use. Pt not using daily but a couple times a month. Discussed if have increase in albuterol usage or noticing more SOB need to think about other maintence inhalers. We really need spirometry. Pt declines at this time.   Tobacco abuse- pt has been tobacco free for 4 months.   Risk discussed for all items pt declined today. Pt is aware.    See After  Visit Summary for Counseling Recommendations

## 2013-12-09 NOTE — Addendum Note (Signed)
Addended by: Beatris Ship L on: 12/09/2013 01:04 PM   Modules accepted: Orders

## 2013-12-10 LAB — COMPLETE METABOLIC PANEL WITH GFR
ALT: 14 U/L (ref 0–35)
AST: 18 U/L (ref 0–37)
Albumin: 4.2 g/dL (ref 3.5–5.2)
Alkaline Phosphatase: 85 U/L (ref 39–117)
BILIRUBIN TOTAL: 0.5 mg/dL (ref 0.2–1.2)
BUN: 12 mg/dL (ref 6–23)
CO2: 28 mEq/L (ref 19–32)
Calcium: 9.6 mg/dL (ref 8.4–10.5)
Chloride: 102 mEq/L (ref 96–112)
Creat: 0.76 mg/dL (ref 0.50–1.10)
GFR, EST NON AFRICAN AMERICAN: 86 mL/min
GFR, Est African American: >89 mL/min
GLUCOSE: 73 mg/dL (ref 70–99)
Potassium: 4.3 mEq/L (ref 3.5–5.3)
SODIUM: 141 meq/L (ref 135–145)
Total Protein: 6.6 g/dL (ref 6.0–8.3)

## 2013-12-10 LAB — LIPID PANEL
CHOL/HDL RATIO: 4.6 ratio
Cholesterol: 244 mg/dL — ABNORMAL HIGH (ref 0–200)
HDL: 53 mg/dL (ref >39)
LDL Cholesterol: 167 mg/dL — ABNORMAL HIGH (ref 0–99)
Triglycerides: 118 mg/dL (ref <150)
VLDL: 24 mg/dL (ref 0–40)

## 2013-12-10 LAB — VITAMIN D 25 HYDROXY (VIT D DEFICIENCY, FRACTURES): VIT D 25 HYDROXY: 48 ng/mL (ref 30–89)

## 2014-02-01 ENCOUNTER — Other Ambulatory Visit: Payer: Self-pay | Admitting: Physician Assistant

## 2014-02-01 MED ORDER — ATORVASTATIN CALCIUM 40 MG PO TABS: 40.0000 mg | ORAL_TABLET | Freq: Every day | ORAL | Status: DC

## 2014-09-24 ENCOUNTER — Emergency Department (HOSPITAL_BASED_OUTPATIENT_CLINIC_OR_DEPARTMENT_OTHER)
Admission: EM | Admit: 2014-09-24 | Discharge: 2014-09-24 | Disposition: A | Discharge status: Home / Self Care | Payer: BLUE CROSS/BLUE SHIELD | Attending: Emergency Medicine | Admitting: Emergency Medicine

## 2014-09-24 ENCOUNTER — Encounter (HOSPITAL_BASED_OUTPATIENT_CLINIC_OR_DEPARTMENT_OTHER): Payer: Self-pay | Admitting: Emergency Medicine

## 2014-09-24 ENCOUNTER — Emergency Department (HOSPITAL_BASED_OUTPATIENT_CLINIC_OR_DEPARTMENT_OTHER): Payer: BLUE CROSS/BLUE SHIELD

## 2014-09-24 DIAGNOSIS — Z9851 Tubal ligation status: Secondary | ICD-10-CM | POA: Insufficient documentation

## 2014-09-24 DIAGNOSIS — R102 Pelvic and perineal pain: Secondary | ICD-10-CM | POA: Diagnosis not present

## 2014-09-24 DIAGNOSIS — R1030 Lower abdominal pain, unspecified: Secondary | ICD-10-CM | POA: Diagnosis present

## 2014-09-24 DIAGNOSIS — Z87891 Personal history of nicotine dependence: Secondary | ICD-10-CM | POA: Insufficient documentation

## 2014-09-24 DIAGNOSIS — N200 Calculus of kidney: Secondary | ICD-10-CM | POA: Diagnosis not present

## 2014-09-24 DIAGNOSIS — Z79899 Other long term (current) drug therapy: Secondary | ICD-10-CM | POA: Diagnosis not present

## 2014-09-24 DIAGNOSIS — J449 Chronic obstructive pulmonary disease, unspecified: Secondary | ICD-10-CM | POA: Diagnosis not present

## 2014-09-24 DIAGNOSIS — Z7982 Long term (current) use of aspirin: Secondary | ICD-10-CM | POA: Diagnosis not present

## 2014-09-24 DIAGNOSIS — M549 Dorsalgia, unspecified: Secondary | ICD-10-CM

## 2014-09-24 LAB — COMPREHENSIVE METABOLIC PANEL
ALBUMIN: 4.2 g/dL (ref 3.5–5.2)
ALK PHOS: 101 U/L (ref 39–117)
ALT: 20 U/L (ref 0–35)
ANION GAP: 10 (ref 5–15)
AST: 25 U/L (ref 0–37)
BUN: 15 mg/dL (ref 6–23)
CO2: 24 mmol/L (ref 19–32)
CREATININE: 0.95 mg/dL (ref 0.50–1.10)
Calcium: 9.6 mg/dL (ref 8.4–10.5)
Chloride: 106 mmol/L (ref 96–112)
GFR calc non Af Amer: 63 mL/min — ABNORMAL LOW (ref >90)
GFR, EST AFRICAN AMERICAN: 73 mL/min — AB (ref >90)
Glucose, Bld: 104 mg/dL — ABNORMAL HIGH (ref 70–99)
Potassium: 3.8 mmol/L (ref 3.5–5.1)
Sodium: 140 mmol/L (ref 135–145)
TOTAL PROTEIN: 7.4 g/dL (ref 6.0–8.3)
Total Bilirubin: 0.3 mg/dL (ref 0.3–1.2)

## 2014-09-24 LAB — URINALYSIS, ROUTINE W REFLEX MICROSCOPIC
BILIRUBIN URINE: NEGATIVE
Glucose, UA: NEGATIVE mg/dL
KETONES UR: 15 mg/dL — AB
Leukocytes, UA: NEGATIVE
NITRITE: NEGATIVE
PROTEIN: NEGATIVE mg/dL
Specific Gravity, Urine: 1.02 (ref 1.005–1.030)
UROBILINOGEN UA: 1 mg/dL (ref 0.0–1.0)
pH: 6.5 (ref 5.0–8.0)

## 2014-09-24 LAB — CBC WITH DIFFERENTIAL/PLATELET
BASOS PCT: 0 % (ref 0–1)
Basophils Absolute: 0 10*3/uL (ref 0.0–0.1)
Eosinophils Absolute: 0.1 10*3/uL (ref 0.0–0.7)
Eosinophils Relative: 0 % (ref 0–5)
HCT: 47.4 % — ABNORMAL HIGH (ref 36.0–46.0)
Hemoglobin: 15.9 g/dL — ABNORMAL HIGH (ref 12.0–15.0)
LYMPHS PCT: 20 % (ref 12–46)
Lymphs Abs: 2.3 10*3/uL (ref 0.7–4.0)
MCH: 31.6 pg (ref 26.0–34.0)
MCHC: 33.5 g/dL (ref 30.0–36.0)
MCV: 94.2 fL (ref 78.0–100.0)
MONOS PCT: 7 % (ref 3–12)
Monocytes Absolute: 0.8 10*3/uL (ref 0.1–1.0)
Neutro Abs: 8.2 10*3/uL — ABNORMAL HIGH (ref 1.7–7.7)
Neutrophils Relative %: 73 % (ref 43–77)
Platelets: 312 10*3/uL (ref 150–400)
RBC: 5.03 MIL/uL (ref 3.87–5.11)
RDW: 13.9 % (ref 11.5–15.5)
WBC: 11.4 10*3/uL — ABNORMAL HIGH (ref 4.0–10.5)

## 2014-09-24 LAB — URINE MICROSCOPIC-ADD ON

## 2014-09-24 LAB — WET PREP, GENITAL
Trich, Wet Prep: NONE SEEN
Yeast Wet Prep HPF POC: NONE SEEN

## 2014-09-24 MED ORDER — ONDANSETRON HCL 4 MG/2ML IJ SOLN
INTRAMUSCULAR | Status: AC
  Filled 2014-09-24: qty 2

## 2014-09-24 MED ORDER — MORPHINE SULFATE 4 MG/ML IJ SOLN
4.0000 mg | Freq: Once | INTRAMUSCULAR | Status: AC
Start: 2014-09-24 — End: 2014-09-24
  Administered 2014-09-24: 4 mg via INTRAVENOUS
  Filled 2014-09-24: qty 1

## 2014-09-24 MED ORDER — KETOROLAC TROMETHAMINE 30 MG/ML IJ SOLN
30.0000 mg | Freq: Once | INTRAMUSCULAR | Status: AC
  Administered 2014-09-24: 30 mg via INTRAVENOUS
  Filled 2014-09-24: qty 1

## 2014-09-24 MED ORDER — SODIUM CHLORIDE 0.9 % IV BOLUS (SEPSIS)
1000.0000 mL | Freq: Once | INTRAVENOUS | Status: AC
  Administered 2014-09-24: 1000 mL via INTRAVENOUS

## 2014-09-24 MED ORDER — OXYCODONE-ACETAMINOPHEN 5-325 MG PO TABS
1.0000 | ORAL_TABLET | Freq: Once | ORAL | Status: AC
  Administered 2014-09-24: 1 via ORAL
  Filled 2014-09-24: qty 1

## 2014-09-24 MED ORDER — ONDANSETRON HCL 4 MG/2ML IJ SOLN
4.0000 mg | Freq: Once | INTRAMUSCULAR | Status: AC
  Administered 2014-09-24: 4 mg via INTRAVENOUS

## 2014-09-24 MED ORDER — TAMSULOSIN HCL 0.4 MG PO CAPS: 0.4000 mg | ORAL_CAPSULE | Freq: Every day | ORAL | Status: DC

## 2014-09-24 MED ORDER — OXYCODONE-ACETAMINOPHEN 5-325 MG PO TABS: 1.0000 | ORAL_TABLET | Freq: Four times a day (QID) | ORAL | Status: DC | PRN

## 2014-09-24 NOTE — ED Notes (Signed)
Lower abd pain with burning, vaginal pressure.

## 2014-09-24 NOTE — ED Notes (Addendum)
Assumed care of patient.

## 2014-09-24 NOTE — Discharge Instructions (Signed)
You were evaluated today and found to have kidney stones. Your kidney stone is 3 x 8 mm. It may or may not pass on its own but it is close to the bladder. At this time we would recommend watchful waiting. He will be given pain medication. He'll be given urology follow-up. See return precautions below.  Kidney Stones Kidney stones (urolithiasis) are deposits that form inside your kidneys. The intense pain is caused by the stone moving through the urinary tract. When the stone moves, the ureter goes into spasm around the stone. The stone is usually passed in the urine.  CAUSES   A disorder that makes certain neck glands produce too much parathyroid hormone (primary hyperparathyroidism).  A buildup of uric acid crystals, similar to gout in your joints.  Narrowing (stricture) of the ureter.  A kidney obstruction present at birth (congenital obstruction).  Previous surgery on the kidney or ureters.  Numerous kidney infections. SYMPTOMS   Feeling sick to your stomach (nauseous).  Throwing up (vomiting).  Blood in the urine (hematuria).  Pain that usually spreads (radiates) to the groin.  Frequency or urgency of urination. DIAGNOSIS   Taking a history and physical exam.  Blood or urine tests.  CT scan.  Occasionally, an examination of the inside of the urinary bladder (cystoscopy) is performed. TREATMENT   Observation.  Increasing your fluid intake.  Extracorporeal shock wave lithotripsy--This is a noninvasive procedure that uses shock waves to break up kidney stones.  Surgery may be needed if you have severe pain or persistent obstruction. There are various surgical procedures. Most of the procedures are performed with the use of small instruments. Only small incisions are needed to accommodate these instruments, so recovery time is minimized. The size, location, and chemical composition are all important variables that will determine the proper choice of action for you. Talk  to your health care provider to better understand your situation so that you will minimize the risk of injury to yourself and your kidney.  HOME CARE INSTRUCTIONS   Drink enough water and fluids to keep your urine clear or pale yellow. This will help you to pass the stone or stone fragments.  Strain all urine through the provided strainer. Keep all particulate matter and stones for your health care provider to see. The stone causing the pain may be as small as a grain of salt. It is very important to use the strainer each and every time you pass your urine. The collection of your stone will allow your health care provider to analyze it and verify that a stone has actually passed. The stone analysis will often identify what you can do to reduce the incidence of recurrences.  Only take over-the-counter or prescription medicines for pain, discomfort, or fever as directed by your health care provider.  Make a follow-up appointment with your health care provider as directed.  Get follow-up X-rays if required. The absence of pain does not always mean that the stone has passed. It may have only stopped moving. If the urine remains completely obstructed, it can cause loss of kidney function or even complete destruction of the kidney. It is your responsibility to make sure X-rays and follow-ups are completed. Ultrasounds of the kidney can show blockages and the status of the kidney. Ultrasounds are not associated with any radiation and can be performed easily in a matter of minutes. SEEK MEDICAL CARE IF:  You experience pain that is progressive and unresponsive to any pain medicine you have been  prescribed. SEEK IMMEDIATE MEDICAL CARE IF:   Pain cannot be controlled with the prescribed medicine.  You have a fever or shaking chills.  The severity or intensity of pain increases over 18 hours and is not relieved by pain medicine.  You develop a new onset of abdominal pain.  You feel faint or pass  out.  You are unable to urinate. MAKE SURE YOU:   Understand these instructions.  Will watch your condition.  Will get help right away if you are not doing well or get worse. Document Released: 06/04/2005 Document Revised: 02/04/2013 Document Reviewed: 11/05/2012 [facility] Patient Information 2015 [place], [place]. This information is not intended to replace advice given to you by your health care provider. Make sure you discuss any questions you have with your health care provider.

## 2014-09-24 NOTE — ED Notes (Addendum)
Pt given PO Ginger Ale. Tolerated well, with no n/v/d.

## 2014-09-24 NOTE — ED Provider Notes (Signed)
CSN: [id number]     Arrival date & time 09/24/14  2355 History   First MD Initiated Contact with Patient 09/24/14 [phone]     Chief Complaint  Patient presents with  . Abdominal Pain     (Consider location/radiation/quality/duration/timing/severity/associated sxs/prior Treatment) HPI  This is a 59 year old female who presents with abdominal pain. Patient reports lower abdominal pain. She states that she woke up this morning and felt fine. She states that she went to the bathroom to urinate and had onset of feeling like "my abdomen is on fire."  Also reports associated back pain. She states "like something is falling out of my vagina." She reports 2 colored urine but denies hematuria or dysuria. Denies any nausea, vomiting, or diarrhea. Currently her pain is 8 out of 10 and nonradiating.  Past Medical History  Diagnosis Date  . Asthma   . COPD (chronic obstructive pulmonary disease)    Past Surgical History  Procedure Laterality Date  . Tubal ligation    . Incontinence surgery  06/18/89  . 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 Coiling brain an     Family History  Problem Relation Age of Onset  . Diabetes Mother   . Heart disease Mother   . Hypertension Mother   . Heart disease Father    History  Substance Use Topics  . Smoking status: Former Smoker -- 1.50 packs/day for 45 years    Types: Cigarettes    Start date: 07/20/2013  . Smokeless tobacco: Never Used  . Alcohol Use: 0.0 oz/week     Comment: 1-2 drinks daily   OB History    No data available     Review of Systems  Constitutional: Negative for fever.  Respiratory: Negative for cough, chest tightness and shortness of breath.   Cardiovascular: Negative for chest pain.  Gastrointestinal: Positive for abdominal pain. Negative for nausea, vomiting and diarrhea.  Genitourinary: Positive for vaginal pain. Negative for dysuria and hematuria.  Musculoskeletal: Negative for back pain.   Neurological: Negative for headaches.  All other systems reviewed and are negative.     Allergies  Review of patient's allergies indicates no known allergies.  Home Medications   Prior to Admission medications   Medication Sig Start Date End Date Taking? Authorizing Provider  Albuterol Sulfate (PROAIR RESPICLICK) 025 (90 BASE) MCG/ACT AEPB Inhale 2 puffs into the lungs every 6 (six) hours as needed. 12/09/13  Yes Jade L Breeback, PA-C  aspirin 81 MG tablet Take 81 mg by mouth daily.   Yes Historical Provider, MD  theophylline (THEODUR) 300 MG 12 hr tablet TAKE ONE TABLET BY MOUTH TWICE DAILY 12/09/13  Yes Jade L Breeback, PA-C  atorvastatin (LIPITOR) 40 MG tablet Take 1 tablet (40 mg total) by mouth daily. 02/01/14   Jade L Breeback, PA-C  oxyCODONE-acetaminophen (PERCOCET/ROXICET) 5-325 MG per tablet Take 1-2 tablets by mouth every 6 (six) hours as needed for severe pain. 09/24/14   Merryl Hacker, MD  tamsulosin (FLOMAX) 0.4 MG CAPS capsule Take 1 capsule (0.4 mg total) by mouth daily. 09/24/14   Merryl Hacker, MD   BP 142/75 mmHg  Pulse 69  Temp(Src) 97.6 F (36.4 C) (Oral)  Resp 18  Ht 5\' 4"  (1.626 m)  Wt 160 lb (72.576 kg)  BMI 27.45 kg/m2  SpO2 96% Physical Exam  Constitutional: She is oriented to person, place, and time. She appears well-developed and well-nourished.  Uncomfortable appearing, no acute distress  HENT:  Head: Normocephalic and atraumatic.  Eyes: Pupils are equal, round, and reactive to light.  Cardiovascular: Normal rate, regular rhythm and normal heart sounds.   No murmur heard. Pulmonary/Chest: Effort normal and breath sounds normal. No respiratory distress. She has no wheezes.  Abdominal: Soft. Bowel sounds are normal. There is no tenderness. There is no rebound and no guarding.  Genitourinary: Vagina normal.  Normal external vaginal exam, no obvious cystocele or rectocele, no cervical motion tenderness, no vaginal discharge  Neurological: She is  alert and oriented to person, place, and time.  Skin: Skin is warm and dry.  Psychiatric: She has a normal mood and affect.  Nursing note and vitals reviewed.   ED Course  Procedures (including critical care time) Labs Review Labs Reviewed  WET PREP, GENITAL - Abnormal; Notable for the following:    Clue Cells Wet Prep HPF POC FEW (*)    WBC, Wet Prep HPF POC MANY (*)    All other components within normal limits  URINALYSIS, ROUTINE W REFLEX MICROSCOPIC - Abnormal; Notable for the following:    APPearance CLOUDY (*)    Hgb urine dipstick LARGE (*)    Ketones, ur 15 (*)    All other components within normal limits  CBC WITH DIFFERENTIAL/PLATELET - Abnormal; Notable for the following:    WBC 11.4 (*)    Hemoglobin 15.9 (*)    HCT 47.4 (*)    Neutro Abs 8.2 (*)    All other components within normal limits  COMPREHENSIVE METABOLIC PANEL - Abnormal; Notable for the following:    Glucose, Bld 104 (*)    GFR calc non Af Amer 63 (*)    GFR calc Af Amer 73 (*)    All other components within normal limits  URINE MICROSCOPIC-ADD ON - Abnormal; Notable for the following:    Squamous Epithelial / LPF FEW (*)    Bacteria, UA MANY (*)    All other components within normal limits  GC/CHLAMYDIA PROBE AMP ([place])    Imaging Review Ct Renal Stone Study  09/24/2014   CLINICAL DATA:  Bilateral flank pain radiating into the pelvis today.  EXAM: CT ABDOMEN AND PELVIS WITHOUT CONTRAST  TECHNIQUE: Multidetector CT imaging of the abdomen and pelvis was performed following the standard protocol without IV contrast.  COMPARISON:  None.  FINDINGS: The lung bases are clear.  No pleural or pericardial effusion.  There is moderate right hydronephrosis with stranding about the right kidney and ureter. Distal right ureteral stone measures 0.3 cm transverse by 0.8 cm long. Two small nonobstructing stones are seen in the lower pole the right kidney. There is a large stone in the upper pole of the right  kidney measuring 1.5 cm in diameter. No left renal stones are noted.  A 1.1 cm low attenuating lesion in the caudate lobe of the liver on image 11 is compatible with a cyst. The liver is otherwise unremarkable. The gallbladder, adrenal glands, spleen and pancreas appear normal. Uterus adnexa are unremarkable. Sigmoid diverticulosis without diverticulitis is identified. The colon is otherwise unremarkable. The stomach, small bowel and appendix appear normal. There is no lymphadenopathy or fluid. Aortoiliac atherosclerosis without aneurysm is identified. No focal bony abnormality is seen.  IMPRESSION: Moderate right hydronephrosis due to a 0.3 cm transverse by 0.8 cm long stone at the right UVJ. 3 additional nonobstructing stones are identified in the right kidney. The largest measures 1.5 cm in diameter.  Aortoiliac atherosclerosis without aneurysm.   Electronically Signed  By: Inge Rise M.D.   On: 09/24/2014 09:42     EKG Interpretation None      MDM   Final diagnoses:  Back pain  Kidney stone    Patient presents with abdominal pain and back pain. Uncomfortable appearing on exam but nontoxic. Afebrile. Patient given pain and nausea medication as well as fluids. Pelvic exam is reassuring without evidence of cystocele or rectocele. Given hematuria noted on urinalysis, kidney stones or consideration. No known history of kidney stones. CT scan is significant for a 3 x 8 mm stone at the UVJ. Discussed this with the patient. She states that her pain is improved with treatment. This stone may pass given current orientation; however given that it is 8 mm at its largest diameter, there is a possibility will not pass. Discussed this with the patient. Patient was given instructions regarding expectant management. She will be given urology follow-up.  After history, exam, and medical workup I feel the patient has been appropriately medically screened and is safe for discharge home. Pertinent diagnoses  were discussed with the patient. Patient was given return precautions.     Merryl Hacker, MD 09/24/14 [phone]

## 2014-09-26 ENCOUNTER — Encounter (HOSPITAL_BASED_OUTPATIENT_CLINIC_OR_DEPARTMENT_OTHER): Payer: Self-pay | Admitting: *Deleted

## 2014-09-26 ENCOUNTER — Emergency Department (HOSPITAL_BASED_OUTPATIENT_CLINIC_OR_DEPARTMENT_OTHER): Payer: BLUE CROSS/BLUE SHIELD

## 2014-09-26 ENCOUNTER — Emergency Department (HOSPITAL_BASED_OUTPATIENT_CLINIC_OR_DEPARTMENT_OTHER)
Admission: EM | Admit: 2014-09-26 | Discharge: 2014-09-26 | Disposition: A | Discharge status: Home / Self Care | Payer: BLUE CROSS/BLUE SHIELD | Attending: Emergency Medicine | Admitting: Emergency Medicine

## 2014-09-26 DIAGNOSIS — Z87442 Personal history of urinary calculi: Secondary | ICD-10-CM | POA: Diagnosis not present

## 2014-09-26 DIAGNOSIS — Z87891 Personal history of nicotine dependence: Secondary | ICD-10-CM | POA: Insufficient documentation

## 2014-09-26 DIAGNOSIS — N390 Urinary tract infection, site not specified: Secondary | ICD-10-CM | POA: Diagnosis not present

## 2014-09-26 DIAGNOSIS — J449 Chronic obstructive pulmonary disease, unspecified: Secondary | ICD-10-CM | POA: Diagnosis not present

## 2014-09-26 DIAGNOSIS — Z79899 Other long term (current) drug therapy: Secondary | ICD-10-CM | POA: Diagnosis not present

## 2014-09-26 DIAGNOSIS — J45909 Unspecified asthma, uncomplicated: Secondary | ICD-10-CM | POA: Diagnosis not present

## 2014-09-26 DIAGNOSIS — Z7982 Long term (current) use of aspirin: Secondary | ICD-10-CM | POA: Insufficient documentation

## 2014-09-26 DIAGNOSIS — R509 Fever, unspecified: Secondary | ICD-10-CM | POA: Diagnosis present

## 2014-09-26 DIAGNOSIS — M791 Myalgia: Secondary | ICD-10-CM | POA: Diagnosis not present

## 2014-09-26 DIAGNOSIS — Z8669 Personal history of other diseases of the nervous system and sense organs: Secondary | ICD-10-CM | POA: Insufficient documentation

## 2014-09-26 DIAGNOSIS — R5383 Other fatigue: Secondary | ICD-10-CM | POA: Insufficient documentation

## 2014-09-26 HISTORY — DX: Sleep apnea, unspecified: G47.30

## 2014-09-26 LAB — COMPREHENSIVE METABOLIC PANEL
ALT: 14 U/L (ref 0–35)
AST: 16 U/L (ref 0–37)
Albumin: 3.4 g/dL — ABNORMAL LOW (ref 3.5–5.2)
Alkaline Phosphatase: 76 U/L (ref 39–117)
Anion gap: 10 (ref 5–15)
BUN: 12 mg/dL (ref 6–23)
CHLORIDE: 103 mmol/L (ref 96–112)
CO2: 21 mmol/L (ref 19–32)
CREATININE: 0.93 mg/dL (ref 0.50–1.10)
Calcium: 8.9 mg/dL (ref 8.4–10.5)
GFR, EST AFRICAN AMERICAN: 75 mL/min — AB (ref >90)
GFR, EST NON AFRICAN AMERICAN: 65 mL/min — AB (ref >90)
Glucose, Bld: 117 mg/dL — ABNORMAL HIGH (ref 70–99)
POTASSIUM: 3.4 mmol/L — AB (ref 3.5–5.1)
Sodium: 134 mmol/L — ABNORMAL LOW (ref 135–145)
Total Bilirubin: 0.5 mg/dL (ref 0.3–1.2)
Total Protein: 6.8 g/dL (ref 6.0–8.3)

## 2014-09-26 LAB — URINALYSIS, ROUTINE W REFLEX MICROSCOPIC
Bilirubin Urine: NEGATIVE
Glucose, UA: NEGATIVE mg/dL
Ketones, ur: 40 mg/dL — AB
Nitrite: POSITIVE — AB
Protein, ur: 100 mg/dL — AB
Specific Gravity, Urine: 1.019 (ref 1.005–1.030)
UROBILINOGEN UA: 1 mg/dL (ref 0.0–1.0)
pH: 6 (ref 5.0–8.0)

## 2014-09-26 LAB — I-STAT CG4 LACTIC ACID, ED: Lactic Acid, Venous: 0.57 mmol/L (ref 0.5–2.0)

## 2014-09-26 LAB — CBC WITH DIFFERENTIAL/PLATELET
BASOS ABS: 0 10*3/uL (ref 0.0–0.1)
Basophils Relative: 0 % (ref 0–1)
EOS ABS: 0 10*3/uL (ref 0.0–0.7)
Eosinophils Relative: 0 % (ref 0–5)
HCT: 42.5 % (ref 36.0–46.0)
HEMOGLOBIN: 14 g/dL (ref 12.0–15.0)
Lymphocytes Relative: 12 % (ref 12–46)
Lymphs Abs: 1.5 10*3/uL (ref 0.7–4.0)
MCH: 31.5 pg (ref 26.0–34.0)
MCHC: 32.9 g/dL (ref 30.0–36.0)
MCV: 95.5 fL (ref 78.0–100.0)
Monocytes Absolute: 1.7 10*3/uL — ABNORMAL HIGH (ref 0.1–1.0)
Monocytes Relative: 14 % — ABNORMAL HIGH (ref 3–12)
NEUTROS ABS: 9 10*3/uL — AB (ref 1.7–7.7)
Neutrophils Relative %: 74 % (ref 43–77)
Platelets: 264 10*3/uL (ref 150–400)
RBC: 4.45 MIL/uL (ref 3.87–5.11)
RDW: 13.9 % (ref 11.5–15.5)
WBC: 12.3 10*3/uL — ABNORMAL HIGH (ref 4.0–10.5)

## 2014-09-26 LAB — URINE MICROSCOPIC-ADD ON

## 2014-09-26 MED ORDER — CEFTRIAXONE SODIUM 1 G IJ SOLR
INTRAMUSCULAR | Status: AC
  Filled 2014-09-26: qty 10

## 2014-09-26 MED ORDER — CIPROFLOXACIN HCL 500 MG PO TABS: 500.0000 mg | ORAL_TABLET | Freq: Two times a day (BID) | ORAL | Status: DC

## 2014-09-26 MED ORDER — ACETAMINOPHEN 325 MG PO TABS
650.0000 mg | ORAL_TABLET | Freq: Once | ORAL | Status: AC
  Administered 2014-09-26: 650 mg via ORAL
  Filled 2014-09-26: qty 2

## 2014-09-26 MED ORDER — DEXTROSE 5 % IV SOLN
1.0000 g | Freq: Once | INTRAVENOUS | Status: AC
  Administered 2014-09-26: 1 g via INTRAVENOUS

## 2014-09-26 MED ORDER — ACETAMINOPHEN 325 MG PO TABS: 650.0000 mg | ORAL_TABLET | Freq: Once | ORAL | Status: DC

## 2014-09-26 MED ORDER — SODIUM CHLORIDE 0.9 % IV BOLUS (SEPSIS)
1000.0000 mL | Freq: Once | INTRAVENOUS | Status: AC
  Administered 2014-09-26: 1000 mL via INTRAVENOUS

## 2014-09-26 NOTE — Discharge Instructions (Signed)
Take cipro twice daily for 10 days.   See urology in a week.   Take tylenol, motrin for fever. You may have fever for next 1-2 days.   Return to ER if you have worse pain, fever for a week, vomiting.

## 2014-09-26 NOTE — ED Notes (Signed)
Pt seen here Friday for kidney stone- reports chills and fever since yesterday- reports temp of 103 at home- states she feels tired

## 2014-09-26 NOTE — ED Provider Notes (Signed)
CSN: [id number]     Arrival date & time 09/26/14  1813 History  This chart was scribed for Wandra Arthurs, MD by Edison Simon, ED Scribe. This patient was seen in room MH09/MH09 and the patient's care was started at 7:59 PM.   Chief Complaint  Patient presents with  . Fever   The history is provided by the patient. No language interpreter was used.    HPI Comments: Marissa Willis is a 59 y.o. female who presents to the Emergency Department complaining of diffuse body aches and chills with onset yesterday. She reports associated fatigue, decreased appetite, and headache. She states she felt hot today and has been sleeping all day. She states temperature was measured at 103 today. She was evaluated here 2 days ago for right flank pain and abdominal pain; she was diagnosed with kidney stone. She denies flank pain or abdominal pain at this time. She has not had kidney stones before. She reports some rust-colored urine. She has not yet seen urology. She states that while on morphine she felt the pain, but then pain suddenly resolved and she had to urinate. She reports history of asthma, COPD, and sleep apnea. She has been coughing.  Past Medical History  Diagnosis Date  . Asthma   . COPD (chronic obstructive pulmonary disease)   . Kidney stone   . Sleep apnea    Past Surgical History  Procedure Laterality Date  . Tubal ligation    . Incontinence surgery  06/18/89  . 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 Coiling brain an     Family History  Problem Relation Age of Onset  . Diabetes Mother   . Heart disease Mother   . Hypertension Mother   . Heart disease Father    History  Substance Use Topics  . Smoking status: Former Smoker -- 1.50 packs/day for 45 years    Types: Cigarettes    Start date: 07/20/2013  . Smokeless tobacco: Never Used  . Alcohol Use: 0.0 oz/week     Comment: 1-2 drinks daily   OB History    No data available     Review of  Systems  Constitutional: Positive for fever, chills, appetite change and fatigue.  Respiratory: Positive for cough.   Gastrointestinal: Negative for abdominal pain.  Genitourinary: Positive for hematuria. Negative for flank pain.  Musculoskeletal: Positive for myalgias.  Neurological: Positive for headaches.  All other systems reviewed and are negative.     Allergies  Review of patient's allergies indicates no known allergies.  Home Medications   Prior to Admission medications   Medication Sig Start Date End Date Taking? Authorizing Provider  Albuterol Sulfate (PROAIR RESPICLICK) 712 (90 BASE) MCG/ACT AEPB Inhale 2 puffs into the lungs every 6 (six) hours as needed. 12/09/13  Yes Jade L Breeback, PA-C  aspirin 81 MG tablet Take 81 mg by mouth daily.   Yes Historical Provider, MD  atorvastatin (LIPITOR) 40 MG tablet Take 1 tablet (40 mg total) by mouth daily. 02/01/14  Yes Jade L Breeback, PA-C  tamsulosin (FLOMAX) 0.4 MG CAPS capsule Take 1 capsule (0.4 mg total) by mouth daily. 09/24/14  Yes Merryl Hacker, MD  theophylline (THEODUR) 300 MG 12 hr tablet TAKE ONE TABLET BY MOUTH TWICE DAILY 12/09/13  Yes Jade L Breeback, PA-C  oxyCODONE-acetaminophen (PERCOCET/ROXICET) 5-325 MG per tablet Take 1-2 tablets by mouth every 6 (six) hours as needed for severe pain. 09/24/14   Barbette Hair  Horton, MD   BP 113/63 mmHg  Pulse 68  Temp(Src) 98.2 F (36.8 C) (Oral)  Resp 18  Ht 5\' 4"  (1.626 m)  Wt 160 lb (72.576 kg)  BMI 27.45 kg/m2  SpO2 94% Physical Exam  Constitutional: She is oriented to person, place, and time. She appears well-developed and well-nourished.  HENT:  Head: Normocephalic and atraumatic.  Dry mucous membranes Oropharynx otherwise clear  Eyes: Conjunctivae are normal.  Neck: Normal range of motion. Neck supple.  No meningeal signs  Cardiovascular: Normal rate, regular rhythm and normal heart sounds.   Pulmonary/Chest: Effort normal and breath sounds normal. No  respiratory distress. She has no wheezes. She has no rales.  Abdominal: Soft. There is tenderness (mild suprapubic tenderness).  No flank tenderness  Musculoskeletal: Normal range of motion.  Neurological: She is alert and oriented to person, place, and time.  Skin: Skin is warm and dry.  Psychiatric: She has a normal mood and affect.  Nursing note and vitals reviewed.   ED Course  Procedures (including critical care time)  DIAGNOSTIC STUDIES: Oxygen Saturation is 97% on room air, normal by my interpretation.    COORDINATION OF CARE: 8:04 PM Discussed with patient that UA reveals infection today, but it did not when she was evaluated 2 days ago. Discussed treatment plan with patient at beside, including ultrasound of her kidney and antibiotics. The patient agrees with the plan and has no further questions at this time.   Labs Review Labs Reviewed  URINALYSIS, ROUTINE W REFLEX MICROSCOPIC - Abnormal; Notable for the following:    APPearance CLOUDY (*)    Hgb urine dipstick LARGE (*)    Ketones, ur 40 (*)    Protein, ur 100 (*)    Nitrite POSITIVE (*)    Leukocytes, UA LARGE (*)    All other components within normal limits  URINE MICROSCOPIC-ADD ON - Abnormal; Notable for the following:    Bacteria, UA MANY (*)    All other components within normal limits  CBC WITH DIFFERENTIAL/PLATELET - Abnormal; Notable for the following:    WBC 12.3 (*)    Neutro Abs 9.0 (*)    Monocytes Relative 14 (*)    Monocytes Absolute 1.7 (*)    All other components within normal limits  COMPREHENSIVE METABOLIC PANEL - Abnormal; Notable for the following:    Sodium 134 (*)    Potassium 3.4 (*)    Glucose, Bld 117 (*)    Albumin 3.4 (*)    GFR calc non Af Amer 65 (*)    GFR calc Af Amer 75 (*)    All other components within normal limits  CULTURE, BLOOD (ROUTINE X 2)  CULTURE, BLOOD (ROUTINE X 2)  URINE CULTURE  I-STAT CG4 LACTIC ACID, ED    Imaging Review Dg Chest 2 View  09/26/2014    CLINICAL DATA:  Cough.  Fever.  EXAM: CHEST  2 VIEW  COMPARISON:  May 21, 2013.  FINDINGS: The heart size and mediastinal contours are within normal limits. Both lungs are clear. No pneumothorax or pleural effusion is noted. Hyperexpansion of the lungs is noted suggesting chronic obstructive pulmonary disease. The visualized skeletal structures are unremarkable.  IMPRESSION: No active cardiopulmonary disease.   Electronically Signed   By: Marijo Conception, M.D.   On: 09/26/2014 21:01   Dg Abd 1 View  09/26/2014   CLINICAL DATA:  Diffuse body aches and chills, cough beginning yesterday. Fatigue, headache, fever, somnolence. Seen here 2 days ago for  RIGHT flank pain.  EXAM: ABDOMEN - 1 VIEW  COMPARISON:  CT of the abdomen and pelvis September 24, 2014  FINDINGS: Bowel gas pattern is nondilated and nonobstructive. Moderate amount of retained large bowel stool. No intra-abdominal mass effect. Known RIGHT nephrolithiasis are difficult to appreciate by plain radiography. Soft tissue planes included osseous structures are nonsuspicious peer  IMPRESSION: Known RIGHT nephrolithiasis difficult to appreciate on today's radiograph.  Moderate amount of retained large bowel stool, normal bowel gas pattern.   Electronically Signed   By: Elon Alas   On: 09/26/2014 21:14   US Renal  09/26/2014   CLINICAL DATA:  Headaches with nausea, vomiting, chills and fever for 1 day. Kidney stones diagnosed 2 days ago by CT.  EXAM: RENAL/URINARY TRACT ULTRASOUND COMPLETE  COMPARISON:  Abdominal pelvic CT 09/24/2014. Radiographs 09/26/2014.  FINDINGS: Right Kidney:  Length: 13.4 cm. Echogenic shadowing focus centrally in the right kidney likely corresponds with the calculus demonstrated in the renal pelvis on CT. There is no hydronephrosis. No cortical thinning or focal cortical abnormality identified.  Left Kidney:  Length: 13.1 cm. Echogenicity within normal limits. No mass or hydronephrosis visualized.  Bladder:  Appears normal for  the degree of bladder distention. Bilateral ureteral jets noted.  IMPRESSION: 1. Resolved hydronephrosis.  Bilateral ureteral jets noted. 2. Calculus in the right renal pelvis, similar to recent CT.   Electronically Signed   By: Richardean Sale M.D.   On: 09/26/2014 21:10     EKG Interpretation None      MDM   Final diagnoses:  None   Cyrilla Durkin is a 59 y.o. female here with fever. Recent kidney stone. Will need to r/o infected stone. Has no flank pain now. Will get xray, [place], sepsis workup.   9:45 PM Fever resolved. Tachycardia improved. US showed no hydro, just the intra renal stone. CXR clear. WBC stable at 12. I think likely complicated UTI. Appears well now. i doubt infected stone and doesn't need emergent urology intervention. Will dc home with cipro for 10 days. I stress that she needs to see urology.   I personally performed the services described in this documentation, which was scribed in my presence. The recorded information has been reviewed and is accurate.   Wandra Arthurs, MD 09/26/14 2147

## 2014-09-27 LAB — GC/CHLAMYDIA PROBE AMP (~~LOC~~) NOT AT ARMC
Chlamydia: NEGATIVE
Neisseria Gonorrhea: NEGATIVE

## 2014-09-28 LAB — URINE CULTURE

## 2014-09-29 ENCOUNTER — Telehealth (HOSPITAL_BASED_OUTPATIENT_CLINIC_OR_DEPARTMENT_OTHER): Payer: Self-pay | Admitting: Emergency Medicine

## 2014-09-29 NOTE — Telephone Encounter (Signed)
Post ED Visit - Positive Culture Follow-up  Culture report reviewed by antimicrobial stewardship pharmacist: []  Wes [place], Pharm.D., BCPS [x]  Heide Guile, Pharm.D., BCPS []  Alycia Rossetti, Pharm.D., BCPS []  [place], Pharm.D., BCPS, AAHIVP []  Legrand Como, Pharm.D., BCPS, AAHIVP []  Isac Sarna, Pharm.D., BCPS  Positive urine culture E. Coli Treated with ciprofloxacin, organism sensitive to the same and no further patient follow-up is required at this time.  Hazle Nordmann 09/29/2014, 2:14 PM

## 2014-10-03 LAB — CULTURE, BLOOD (ROUTINE X 2)
Culture: NO GROWTH
Culture: NO GROWTH

## 2014-12-28 ENCOUNTER — Ambulatory Visit (INDEPENDENT_AMBULATORY_CARE_PROVIDER_SITE_OTHER): Payer: BLUE CROSS/BLUE SHIELD | Admitting: Family Medicine

## 2014-12-28 ENCOUNTER — Encounter: Payer: Self-pay | Admitting: Family Medicine

## 2014-12-28 VITALS — BP 119/73 | HR 71 | Ht 64.0 in | Wt 158.0 lb

## 2014-12-28 DIAGNOSIS — J411 Mucopurulent chronic bronchitis: Secondary | ICD-10-CM

## 2014-12-28 DIAGNOSIS — I700 Atherosclerosis of aorta: Secondary | ICD-10-CM | POA: Diagnosis not present

## 2014-12-28 DIAGNOSIS — J4520 Mild intermittent asthma, uncomplicated: Secondary | ICD-10-CM | POA: Diagnosis not present

## 2014-12-28 MED ORDER — THEOPHYLLINE ER 300 MG PO TB12: ORAL_TABLET | ORAL | Status: DC

## 2014-12-28 MED ORDER — BUDESONIDE-FORMOTEROL FUMARATE 160-4.5 MCG/ACT IN AERO: 2.0000 | INHALATION_SPRAY | Freq: Two times a day (BID) | RESPIRATORY_TRACT | Status: DC

## 2014-12-28 MED ORDER — ATORVASTATIN CALCIUM 40 MG PO TABS: 20.0000 mg | ORAL_TABLET | Freq: Every day | ORAL | Status: DC

## 2014-12-28 MED ORDER — ALBUTEROL SULFATE 108 (90 BASE) MCG/ACT IN AEPB: 2.0000 | INHALATION_SPRAY | Freq: Four times a day (QID) | RESPIRATORY_TRACT | Status: DC | PRN

## 2014-12-28 NOTE — Addendum Note (Signed)
Addended by: Marcial Pacas on: 12/28/2014 11:40 AM   Modules accepted: Orders

## 2014-12-28 NOTE — Progress Notes (Signed)
CC: Marissa Willis is a 59 y.o. female is here for Medication Refill and Asthma   Subjective: HPI:  Follow-up COPD: She was on a higher dose in the past however it caused intolerable palpitations and irregular heartbeat. She denies any nocturnal symptoms of needing albuterol. Her symptoms of wheezing and cough have been worse over the last week because she's been sanding and staining wood floors at her home. She says normally without these triggers she only uses her albuterol 1-2 times a week. Current symptoms are mild in severity. Symptoms are not interfering with quality of life. She denies chest pain, shortness of breath, confusion, fevers, chills, nor blood in sputum.  While going over recent CT scan I pointed out that she has atherosclerosis in the aorta. She was taking atorvastatin that was prescribed at a recent annual physical however when it ran out of refills after 6 months she stopped taking it because she heard bad press about statin medications. She did not have any side effects on this medication. Denies limb claudication or exertional chest pain.  Review of systems Outlined In HPI  Past Medical History  Diagnosis Date  . Asthma   . COPD (chronic obstructive pulmonary disease)   . Kidney stone   . Sleep apnea     Past Surgical History  Procedure Laterality Date  . Tubal ligation    . Incontinence surgery  06/18/89  . 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 Coiling brain an     Family History  Problem Relation Age of Onset  . Diabetes Mother   . Heart disease Mother   . Hypertension Mother   . Heart disease Father     History   Social History  . Marital Status: Married    Spouse Name: Liliane Channel  . Number of Children: 2  . Years of Education: 14   Occupational History  . Not on file.   Social History Main Topics  . Smoking status: Former Smoker -- 1.50 packs/day for 45 years    Types: Cigarettes    Start date: 07/20/2013  .  Smokeless tobacco: Never Used  . Alcohol Use: 0.0 oz/week     Comment: 1-2 drinks daily  . Drug Use: Yes    Special: Marijuana     Comment: marijuara  . Sexual Activity: Not on file   Other Topics Concern  . Not on file   Social History Narrative   Lives at home with husband Liliane Channel ) and 55 yr old mother.   2 daughters   Pt is Rt handed   Patient has a college education.   Patient drinks a cup of coffee daily and occasionally tea.     Objective: BP 119/73 mmHg  Pulse 71  Ht 5\' 4"  (1.626 m)  Wt 158 lb (71.668 kg)  BMI 27.11 kg/m2  General: Alert and Oriented, No Acute Distress HEENT: Pupils equal, round, reactive to light. Conjunctivae clear.  Moist mucous membranes  Lungs: Comfortable work of breathing with mild end expiratory wheezing in all lung fields.  Cardiac: Regular rate and rhythm. Normal S1/S2.  No murmurs, rubs, nor gallops.   Extremities: No peripheral edema.  Strong peripheral pulses.  Mental Status: No depression, anxiety, nor agitation. Skin: Warm and dry.  Assessment & Plan: Tammy was seen today for medication refill and asthma.  Diagnoses and all orders for this visit:  Mucopurulent chronic bronchitis Orders: -     Theophylline level  Asthma, mild intermittent,  uncomplicated Orders: -     Theophylline level -     theophylline (THEODUR) 300 MG 12 hr tablet; TAKE ONE TABLET BY MOUTH TWICE DAILY -     Albuterol Sulfate (PROAIR RESPICLICK) 786 (90 BASE) MCG/ACT AEPB; Inhale 2 puffs into the lungs every 6 (six) hours as needed.  Aortic atherosclerosis  Other orders -     atorvastatin (LIPITOR) 40 MG tablet; Take 0.5 tablets (20 mg total) by mouth daily.   Aortic atherosclerosis: Uncontrolled chronic condition, Strongly encouraged her to restart taking Lipitor given this finding and with her poorly controlled cholesterol found last year and the years before. Congratulated her success with quitting smoking. COPD/asthma: Controlled, checking  theophylline level to ensure that she is not toxic range. Continue current theophylline pending the results of this. Continue as needed albuterol.   Return in about 6 months (around 06/30/2015).

## 2014-12-28 NOTE — Progress Notes (Signed)
Notified by pharmacy at [facility] that her dose of theophylline is on back order. She will be provided with Symbicort to use only until the theophylline is available.

## 2014-12-29 LAB — THEOPHYLLINE LEVEL: Theophylline Lvl: 9.8 ug/mL — ABNORMAL LOW (ref 10.0–20.0)

## 2015-01-27 ENCOUNTER — Telehealth: Payer: Self-pay

## 2015-01-27 NOTE — Telephone Encounter (Signed)
Pt called requesting a rx for Symbicort. I explained that the last note stated Symbicort is to be used only until the theophylline is available. Pt stated that she did research and this medication has been unavailable since march and she believes it was discontinued. She would like the rx for Symbicort. Thanks.

## 2015-01-28 MED ORDER — BUDESONIDE-FORMOTEROL FUMARATE 160-4.5 MCG/ACT IN AERO: 2.0000 | INHALATION_SPRAY | Freq: Two times a day (BID) | RESPIRATORY_TRACT | Status: DC

## 2015-01-28 NOTE — Telephone Encounter (Signed)
Marissa Willis, Can you please check with the [facility] regarding the status of theophylline and let me know what they say.  I'll determine whether or not I refill symbicort based on this response.

## 2015-01-28 NOTE — Telephone Encounter (Signed)
[facility] said that the theophylline has not been discontinued, but it is on a manufacturer back order.

## 2015-01-28 NOTE — Telephone Encounter (Signed)
Staff, Can you update patient what Marissa Willis found out and that I sent refills of symbicort to [facility].

## 2015-01-28 NOTE — Telephone Encounter (Signed)
Left detailed message.   

## 2015-05-03 ENCOUNTER — Encounter: Payer: Self-pay | Admitting: *Deleted

## 2015-05-03 ENCOUNTER — Emergency Department (INDEPENDENT_AMBULATORY_CARE_PROVIDER_SITE_OTHER)
Admission: EM | Admit: 2015-05-03 | Discharge: 2015-05-03 | Disposition: A | Discharge status: Home / Self Care | Payer: BLUE CROSS/BLUE SHIELD | Source: Home / Self Care | Attending: Family Medicine | Admitting: Family Medicine

## 2015-05-03 DIAGNOSIS — Z23 Encounter for immunization: Secondary | ICD-10-CM

## 2015-05-03 DIAGNOSIS — Z1839 Other retained organic fragments: Secondary | ICD-10-CM | POA: Diagnosis not present

## 2015-05-03 DIAGNOSIS — S30851A Superficial foreign body of abdominal wall, initial encounter: Secondary | ICD-10-CM | POA: Diagnosis not present

## 2015-05-03 MED ORDER — DOXYCYCLINE HYCLATE 50 MG PO CAPS: 50.0000 mg | ORAL_CAPSULE | Freq: Two times a day (BID) | ORAL | Status: DC

## 2015-05-03 MED ORDER — INFLUENZA VAC SPLIT QUAD 0.5 ML IM SUSY
0.5000 mL | PREFILLED_SYRINGE | Freq: Once | INTRAMUSCULAR | Status: AC
  Administered 2015-05-03: 0.5 mL via INTRAMUSCULAR

## 2015-05-03 NOTE — Discharge Instructions (Signed)
°  Please take antibiotics as prescribed and be sure to complete entire course even if you start to feel better to ensure infection does not come back. ° °

## 2015-05-03 NOTE — ED Notes (Signed)
Pt c/o red area on her LT flank area x today. She is concerned it could be a spider bite.

## 2015-05-03 NOTE — ED Provider Notes (Signed)
CSN: [id number]     Arrival date & time 05/03/15  1345 History   First MD Initiated Contact with Patient 05/03/15 1355     Chief Complaint  Patient presents with  . Insect Bite   (Consider location/radiation/quality/duration/timing/severity/associated sxs/prior Treatment) HPI Pt is a 59yo female presenting to [facility] with c/o mild pain and irritation to her Left flank. Pt states she noticed an area of redness as she was drying off after taking a shower early today. Pt is concerned she was bit by a spider. Denies fever, chills, n/v/d. Pt does report having pets that are in-door out-door pets that could track in insects.  Pt also requesting a flu vaccine today.  Past Medical History  Diagnosis Date  . Asthma   . COPD (chronic obstructive pulmonary disease) ([place])   . Kidney stone   . Sleep apnea    Past Surgical History  Procedure Laterality Date  . Tubal ligation    . Incontinence surgery  06/18/89  . 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 Coiling brain an     Family History  Problem Relation Age of Onset  . Diabetes Mother   . Heart disease Mother   . Hypertension Mother   . Heart disease Father    Social History  Substance Use Topics  . Smoking status: Former Smoker -- 1.50 packs/day for 45 years    Types: Cigarettes    Start date: 07/20/2013  . Smokeless tobacco: Never Used  . Alcohol Use: 0.0 oz/week     Comment: 1-2 drinks daily   OB History    No data available     Review of Systems  Constitutional: Negative for fever and chills.  Gastrointestinal: Positive for abdominal pain. Negative for nausea, vomiting and diarrhea.  Genitourinary: Positive for flank pain. Negative for dysuria, frequency and hematuria.  Musculoskeletal: Negative for myalgias and arthralgias.  Skin: Positive for color change and wound.    Allergies  Review of patient's allergies indicates no known allergies.  Home Medications   Prior to Admission  medications   Medication Sig Start Date End Date Taking? Authorizing Provider  Albuterol Sulfate (PROAIR RESPICLICK) 123XX123 (90 BASE) MCG/ACT AEPB Inhale 2 puffs into the lungs every 6 (six) hours as needed. 12/28/14   Marcial Pacas, DO  aspirin 81 MG tablet Take 81 mg by mouth daily.    Historical Provider, MD  atorvastatin (LIPITOR) 40 MG tablet Take 0.5 tablets (20 mg total) by mouth daily. 12/28/14   Sean Hommel, DO  budesonide-formoterol (SYMBICORT) 160-4.5 MCG/ACT inhaler Inhale 2 puffs into the lungs 2 (two) times daily. 01/28/15   Marcial Pacas, DO  doxycycline (VIBRAMYCIN) 50 MG capsule Take 1 capsule (50 mg total) by mouth 2 (two) times daily. For 10 days 05/03/15   Noland Fordyce, PA-C  theophylline (THEODUR) 300 MG 12 hr tablet TAKE ONE TABLET BY MOUTH TWICE DAILY 12/28/14   Marcial Pacas, DO   Meds Ordered and Administered this Visit   Medications  Influenza vac split quadrivalent PF (FLUARIX) injection 0.5 mL (0.5 mLs Intramuscular Given 05/03/15 1410)    BP 135/79 mmHg  Pulse 73  Temp(Src) 98.7 F (37.1 C) (Oral)  Resp 16  Ht 5\' 4"  (1.626 m)  Wt 167 lb (75.751 kg)  BMI 28.65 kg/m2  SpO2 96% No data found.   Physical Exam  Constitutional: She is oriented to person, place, and time. She appears well-developed and well-nourished.  Pt appears well, non-toxic. NAD  HENT:  Head: Normocephalic and atraumatic.  Eyes: EOM are normal.  Neck: Normal range of motion.  Cardiovascular: Normal rate.   Pulmonary/Chest: Effort normal.  Musculoskeletal: Normal range of motion.  Neurological: She is alert and oriented to person, place, and time.  Skin: Skin is warm and dry. There is erythema.     3cm area of erythema. Tick embedded in center of erythema. No bleeding or discharge. Mild tenderness.   Psychiatric: She has a normal mood and affect. Her behavior is normal.  Nursing note and vitals reviewed.   ED Course  .Foreign Body Removal Date/Time: 05/03/2015 2:12 PM Performed by:  Noland Fordyce Authorized by: Theone Murdoch A Consent: Verbal consent obtained. Risks and benefits: risks, benefits and alternatives were discussed Consent given by: patient Patient understanding: patient states understanding of the procedure being performed Site marked: the operative site was marked Required items: required blood products, implants, devices, and special equipment available Patient identity confirmed: verbally with patient Body area: skin General location: trunk Location details: left flank Anesthesia method: None. Patient sedated: no Patient restrained: no Localization method: visualized Removal mechanism: forceps Dressing: antibiotic ointment Tendon involvement: none Depth: subcutaneous Complexity: simple 1 objects recovered. Objects recovered: Full Tick Post-procedure assessment: foreign body removed Patient tolerance: Patient tolerated the procedure well with no immediate complications   (including critical care time)  Labs Review Labs Reviewed  POCT INFLUENZA A/B    Imaging Review No results found.    MDM   1. Embedded tick of abdominal wall, initial encounter   2. Flu vaccine need     Pt c/o redness and irritation to her Left flank, noticed earlier today while drying off after a shower. On exam, live tick noted to be embedded into her skin.  3cm area of surrounding erythema with mild tenderness.  Rx: Doxycycline 100mg  BID for 10 days. Home care instructions provided.  F/u with PCP in 4-5 days if not improving. Sooner if worsening.  Patient verbalized understanding and agreement with treatment plan.    Noland Fordyce, PA-C 05/03/15 1416

## 2015-05-08 ENCOUNTER — Telehealth: Payer: Self-pay | Admitting: *Deleted

## 2015-07-06 ENCOUNTER — Ambulatory Visit (INDEPENDENT_AMBULATORY_CARE_PROVIDER_SITE_OTHER): Payer: BLUE CROSS/BLUE SHIELD | Admitting: Physician Assistant

## 2015-07-06 ENCOUNTER — Encounter: Payer: Self-pay | Admitting: Physician Assistant

## 2015-07-06 VITALS — BP 133/68 | HR 75 | Ht 64.0 in | Wt 174.0 lb

## 2015-07-06 DIAGNOSIS — I700 Atherosclerosis of aorta: Secondary | ICD-10-CM

## 2015-07-06 DIAGNOSIS — J4520 Mild intermittent asthma, uncomplicated: Secondary | ICD-10-CM

## 2015-07-06 DIAGNOSIS — Z87891 Personal history of nicotine dependence: Secondary | ICD-10-CM

## 2015-07-06 DIAGNOSIS — J411 Mucopurulent chronic bronchitis: Secondary | ICD-10-CM | POA: Diagnosis not present

## 2015-07-06 DIAGNOSIS — Z114 Encounter for screening for human immunodeficiency virus [HIV]: Secondary | ICD-10-CM

## 2015-07-06 DIAGNOSIS — Z0001 Encounter for general adult medical examination with abnormal findings: Secondary | ICD-10-CM

## 2015-07-06 DIAGNOSIS — Z1159 Encounter for screening for other viral diseases: Secondary | ICD-10-CM

## 2015-07-06 DIAGNOSIS — R600 Localized edema: Secondary | ICD-10-CM | POA: Insufficient documentation

## 2015-07-06 DIAGNOSIS — Z1239 Encounter for other screening for malignant neoplasm of breast: Secondary | ICD-10-CM

## 2015-07-06 DIAGNOSIS — E785 Hyperlipidemia, unspecified: Secondary | ICD-10-CM

## 2015-07-06 DIAGNOSIS — Z131 Encounter for screening for diabetes mellitus: Secondary | ICD-10-CM

## 2015-07-06 DIAGNOSIS — R6889 Other general symptoms and signs: Secondary | ICD-10-CM

## 2015-07-06 MED ORDER — HYDROCHLOROTHIAZIDE 12.5 MG PO TABS: 25.0000 mg | ORAL_TABLET | Freq: Every day | ORAL | Status: DC

## 2015-07-06 MED ORDER — ATORVASTATIN CALCIUM 40 MG PO TABS: 40.0000 mg | ORAL_TABLET | Freq: Every day | ORAL | Status: DC

## 2015-07-06 MED ORDER — ALBUTEROL SULFATE 108 (90 BASE) MCG/ACT IN AEPB: 2.0000 | INHALATION_SPRAY | Freq: Four times a day (QID) | RESPIRATORY_TRACT | Status: DC | PRN

## 2015-07-06 MED ORDER — FLUTICASONE FUROATE-VILANTEROL 100-25 MCG/INH IN AEPB: 1.0000 | INHALATION_SPRAY | Freq: Every day | RESPIRATORY_TRACT | Status: DC

## 2015-07-06 MED FILL — HYDROCHLOROTHIAZIDE 12.5 MG: 12.5 | 15 days supply | Qty: 30 | Fill #0

## 2015-07-06 MED FILL — ATORVASTATIN 40 MG TABLET: 40 | 90 days supply | Qty: 90 | Fill #0

## 2015-07-06 MED FILL — BREO ELLIPTA 100-25 MCG INH: 100-25 | 30 days supply | Qty: 60 | Fill #0

## 2015-07-06 MED FILL — PROAIR RESPICLICK INHAL PWD: 108 (90 BAS | 50 days supply | Qty: 2 | Fill #0

## 2015-07-06 NOTE — Patient Instructions (Addendum)
Compression stockings.   Peripheral Edema You have swelling in your legs (peripheral edema). This swelling is due to excess accumulation of salt and water in your body. Edema may be a sign of heart, kidney or liver disease, or a side effect of a medication. It may also be due to problems in the leg veins. Elevating your legs and using special support stockings may be very helpful, if the cause of the swelling is due to poor venous circulation. Avoid long periods of standing, whatever the cause. Treatment of edema depends on identifying the cause. Chips, pretzels, pickles and other salty foods should be avoided. Restricting salt in your diet is almost always needed. Water pills (diuretics) are often used to remove the excess salt and water from your body via urine. These medicines prevent the kidney from reabsorbing sodium. This increases urine flow. Diuretic treatment may also result in lowering of potassium levels in your body. Potassium supplements may be needed if you have to use diuretics daily. Daily weights can help you keep track of your progress in clearing your edema. You should call your caregiver for follow up care as recommended. SEEK IMMEDIATE MEDICAL CARE IF:   You have increased swelling, pain, redness, or heat in your legs.  You develop shortness of breath, especially when lying down.  You develop chest or abdominal pain, weakness, or fainting.  You have a fever.   This information is not intended to replace advice given to you by your health care provider. Make sure you discuss any questions you have with your health care provider.   Document Released: 07/12/2004 Document Revised: 08/27/2011 Document Reviewed: 12/15/2014 Elsevier Interactive Patient Education 2016 [place] [employer]  Get These Tests  Blood Pressure- Have your blood pressure checked by your healthcare provider at least once a year.  Normal blood pressure is 120/80.  Weight- Have your body  mass index (BMI) calculated to screen for obesity.  BMI is a measure of body fat based on height and weight.  You can calculate your own BMI at [employer]  Cholesterol- Have your cholesterol checked every year.  Diabetes- Have your blood sugar checked every year if you have high blood pressure, high cholesterol, a family history of diabetes or if you are overweight.  Pap Test - Have a pap test every 1 to 5 years if you have been sexually active.  If you are older than 65 and recent pap tests have been normal you may not need additional pap tests.  In addition, if you have had a hysterectomy  for benign disease additional pap tests are not necessary.  Mammogram-Yearly mammograms are essential for early detection of breast cancer  Screening for Colon Cancer- Colonoscopy starting at age 23. Screening may begin sooner depending on your family history and other health conditions.  Follow up colonoscopy as directed by your Gastroenterologist.  Screening for Osteoporosis- Screening begins at age 105 with bone density scanning, sooner if you are at higher risk for developing Osteoporosis.  Get these medicines  Calcium with Vitamin D- Your body requires 1200-1500 mg of Calcium a day and [phone] IU of Vitamin D a day.  You can only absorb 500 mg of Calcium at a time therefore Calcium must be taken in 2 or 3 separate doses throughout the day.  Hormones- Hormone therapy has been associated with increased risk for certain cancers and heart disease.  Talk to your healthcare provider about if you need relief from menopausal symptoms.  Aspirin- Ask your  healthcare provider about taking Aspirin to prevent Heart Disease and Stroke.  Get these Immuniztions  Flu shot- Every fall  Pneumonia shot- Once after the age of 49; if you are younger ask your healthcare provider if you need a pneumonia shot.  Tetanus- Every ten years.  Zostavax- Once after the age of 9 to prevent shingles.  Take  these steps  Don't smoke- Your healthcare provider can help you quit. For tips on how to quit, ask your healthcare provider or go to www.smokefree.gov or call 1-800 QUIT-NOW.  Be physically active- Exercise 5 days a week for a minimum of 30 minutes.  If you are not already physically active, start slow and gradually work up to 30 minutes of moderate physical activity.  Try walking, dancing, bike riding, swimming, etc.  Eat a healthy diet- Eat a variety of healthy foods such as fruits, vegetables, whole grains, low fat milk, low fat cheeses, yogurt, lean meats, chicken, fish, eggs, dried beans, tofu, etc.  For more information go to www.thenutritionsource.org  Dental visit- Brush and floss teeth twice daily; visit your dentist twice a year.  Eye exam- Visit your Optometrist or Ophthalmologist yearly.  Drink alcohol in moderation- Limit alcohol intake to one drink or less a day.  Never drink and drive.  Depression- Your emotional health is as important as your physical health.  If you're feeling down or losing interest in things you normally enjoy, please talk to your healthcare provider.  Seat Belts- can save your life; always wear one  Smoke/Carbon Monoxide detectors- These detectors need to be installed on the appropriate level of your home.  Replace batteries at least once a year.  Violence- If anyone is threatening or hurting you, please tell your healthcare provider.  Living Will/ Health care power of attorney- Discuss with your healthcare provider and family.

## 2015-07-11 NOTE — Progress Notes (Signed)
Subjective:     Marissa Willis is a 60 y.o. female and is here for a comprehensive physical exam. The patient reports no problems but pt is not taking her medications. she has not taken lipitor or symbicort. symbicort was too expensive.  she has been having some bilateral lower leg edema. Off and on. Worse after standing all day. No pain. Not tried anything to make better.   Social History   Social History  . Marital Status: Married    Spouse Name: Liliane Channel  . Number of Children: 2  . Years of Education: 14   Occupational History  . Not on file.   Social History Main Topics  . Smoking status: Former Smoker -- 1.50 packs/day for 45 years    Types: Cigarettes    Start date: 07/20/2013  . Smokeless tobacco: Never Used  . Alcohol Use: 0.0 oz/week     Comment: 1-2 drinks daily  . Drug Use: Yes    Special: Marijuana     Comment: marijuara  . Sexual Activity: Not on file   Other Topics Concern  . Not on file   Social History Narrative   Lives at home with husband Liliane Channel ) and 42 yr old mother.   2 daughters   Pt is Rt handed   Patient has a college education.   Patient drinks a cup of coffee daily and occasionally tea.   Health Maintenance  Topic Date Due  . Hepatitis C Screening  Nov 14, 1955  . HIV Screening  08/14/1968  . MAMMOGRAM  11/16/2008  . PAP SMEAR  04/07/2023 (Originally 06/18/2008)  . INFLUENZA VACCINE  01/17/2016  . COLONOSCOPY  03/18/2018  . TETANUS/TDAP  06/18/2018  . ZOSTAVAX  Completed      Review of Systems Pertinent items noted in HPI and remainder of comprehensive ROS otherwise negative.   Objective:    BP 133/68 mmHg  Pulse 75  Ht 5\' 4"  (1.626 m)  Wt 174 lb (78.926 kg)  BMI 29.85 kg/m2 General appearance: alert, cooperative, appears stated age and mildly obese Head: Normocephalic, without obvious abnormality, atraumatic Eyes: conjunctivae/corneas clear. PERRL, EOM's intact. Fundi benign. Ears: normal TM's and external ear canals both  ears Nose: Nares normal. Septum midline. Mucosa normal. No drainage or sinus tenderness. Throat: lips, mucosa, and tongue normal; teeth and gums normal Neck: no adenopathy, no carotid bruit, no JVD, supple, symmetrical, trachea midline and thyroid not enlarged, symmetric, no tenderness/mass/nodules Back: symmetric, no curvature. ROM normal. No CVA tenderness. Lungs: clear to auscultation bilaterally Heart: regular rate and rhythm, S1, S2 normal, no murmur, click, rub or gallop Abdomen: soft, non-tender; bowel sounds normal; no masses,  no organomegaly Extremities: edema 1+ pitting edema bilaterally. no redness or tenderness.  Pulses: 2+ and symmetric Skin: Skin color, texture, turgor normal. No rashes or lesions Lymph nodes: Cervical, supraclavicular, and axillary nodes normal. Neurologic: Grossly normal    Assessment:    Healthy female exam.      Plan:    CPE- ordered mammogram. Pt declined pap and bimanuel today. Fasting labs ordered.HIV and Hep C ordered. Continue on ASA 81mg  daily. Encouraged healthy diet and exercise. Vitamin D and calcium recommendations discussed.   Aortic Arthrosclerosis/hyperlipidemia- lipid ordered. Can hold and get checked in 3 months after restarting lipitor. Sent to pharmacy. Discussed importance of taking. Discussed low fat diet.   COPD/asthma- refilled albuterol inhaler. BREO with coupon card sent. Follow up in 2 months.   Bilateral leg swelling- discussed conservative measures. Encouraged compression stockings. Low salt  diet. HCTZ as needed given. Follow up in 2 months. Weight loss could also help.  See After Visit Summary for Counseling Recommendations

## 2015-08-02 MED FILL — BREO ELLIPTA 100-25 MCG INH: 100-25 | 30 days supply | Qty: 60 | Fill #1

## 2015-08-08 LAB — LIPID PANEL
CHOLESTEROL: 127 mg/dL (ref 0–200)
HDL: 45 mg/dL (ref 35–70)
LDL Cholesterol: 68 mg/dL
Triglycerides: 69 mg/dL (ref 40–160)

## 2015-08-08 LAB — HEMOGLOBIN A1C: Hemoglobin A1C: 5.6

## 2015-08-08 LAB — HEPATIC FUNCTION PANEL
ALT: 24 U/L (ref 7–35)
AST: 22 U/L (ref 13–35)
Alkaline Phosphatase: 85 U/L (ref 25–125)
Bilirubin, Total: 0.5 mg/dL

## 2015-08-08 LAB — BASIC METABOLIC PANEL
BUN: 11 mg/dL (ref 4–21)
CREATININE: 0.8 mg/dL (ref <=1.1)

## 2015-08-31 MED FILL — BREO ELLIPTA 100-25 MCG INH: 100-25 | 30 days supply | Qty: 60 | Fill #2

## 2015-09-05 ENCOUNTER — Ambulatory Visit: Payer: BLUE CROSS/BLUE SHIELD | Admitting: Physician Assistant

## 2015-09-07 ENCOUNTER — Encounter: Payer: Self-pay | Admitting: Physician Assistant

## 2015-09-29 MED FILL — BREO ELLIPTA 100-25 MCG INH: 100-25 | 30 days supply | Qty: 60 | Fill #3

## 2015-11-02 MED FILL — PROAIR RESPICLICK INHAL PWD: 108 (90 BAS | 50 days supply | Qty: 2 | Fill #1

## 2015-11-02 MED FILL — BREO ELLIPTA 100-25 MCG INH: 100-25 | 30 days supply | Qty: 60 | Fill #4

## 2015-11-29 MED FILL — BREO ELLIPTA 100-25 MCG INH: 100-25 | 30 days supply | Qty: 60 | Fill #5

## 2015-12-09 ENCOUNTER — Emergency Department (HOSPITAL_BASED_OUTPATIENT_CLINIC_OR_DEPARTMENT_OTHER): Payer: BLUE CROSS/BLUE SHIELD

## 2015-12-09 ENCOUNTER — Encounter (HOSPITAL_COMMUNITY)
Admission: EM | Disposition: A | Discharge status: Home / Self Care | Payer: Self-pay | Source: Home / Self Care | Attending: Urology

## 2015-12-09 ENCOUNTER — Emergency Department (HOSPITAL_COMMUNITY): Payer: BLUE CROSS/BLUE SHIELD | Admitting: Anesthesiology

## 2015-12-09 ENCOUNTER — Encounter (HOSPITAL_BASED_OUTPATIENT_CLINIC_OR_DEPARTMENT_OTHER): Payer: Self-pay | Admitting: *Deleted

## 2015-12-09 ENCOUNTER — Inpatient Hospital Stay (HOSPITAL_BASED_OUTPATIENT_CLINIC_OR_DEPARTMENT_OTHER)
Admission: EM | Admit: 2015-12-09 | Discharge: 2015-12-11 | DRG: 690 | Disposition: A | Discharge status: Home / Self Care | Payer: BLUE CROSS/BLUE SHIELD | Attending: Urology | Admitting: Urology

## 2015-12-09 DIAGNOSIS — B9620 Unspecified Escherichia coli [E. coli] as the cause of diseases classified elsewhere: Secondary | ICD-10-CM | POA: Diagnosis present

## 2015-12-09 DIAGNOSIS — N12 Tubulo-interstitial nephritis, not specified as acute or chronic: Secondary | ICD-10-CM

## 2015-12-09 DIAGNOSIS — N200 Calculus of kidney: Secondary | ICD-10-CM

## 2015-12-09 DIAGNOSIS — Z7982 Long term (current) use of aspirin: Secondary | ICD-10-CM

## 2015-12-09 DIAGNOSIS — Z8249 Family history of ischemic heart disease and other diseases of the circulatory system: Secondary | ICD-10-CM | POA: Diagnosis not present

## 2015-12-09 DIAGNOSIS — Z833 Family history of diabetes mellitus: Secondary | ICD-10-CM | POA: Diagnosis not present

## 2015-12-09 DIAGNOSIS — N136 Pyonephrosis: Secondary | ICD-10-CM | POA: Diagnosis present

## 2015-12-09 DIAGNOSIS — J45909 Unspecified asthma, uncomplicated: Secondary | ICD-10-CM | POA: Diagnosis present

## 2015-12-09 DIAGNOSIS — Z79899 Other long term (current) drug therapy: Secondary | ICD-10-CM | POA: Diagnosis not present

## 2015-12-09 DIAGNOSIS — Z87891 Personal history of nicotine dependence: Secondary | ICD-10-CM

## 2015-12-09 DIAGNOSIS — J449 Chronic obstructive pulmonary disease, unspecified: Secondary | ICD-10-CM | POA: Diagnosis present

## 2015-12-09 DIAGNOSIS — R109 Unspecified abdominal pain: Secondary | ICD-10-CM | POA: Diagnosis present

## 2015-12-09 DIAGNOSIS — Z7951 Long term (current) use of inhaled steroids: Secondary | ICD-10-CM

## 2015-12-09 DIAGNOSIS — N202 Calculus of kidney with calculus of ureter: Secondary | ICD-10-CM | POA: Diagnosis present

## 2015-12-09 DIAGNOSIS — G4730 Sleep apnea, unspecified: Secondary | ICD-10-CM | POA: Diagnosis present

## 2015-12-09 DIAGNOSIS — Z87442 Personal history of urinary calculi: Secondary | ICD-10-CM

## 2015-12-09 HISTORY — PX: CYSTOSCOPY WITH URETEROSCOPY AND STENT PLACEMENT: SHX6377

## 2015-12-09 LAB — CBC WITH DIFFERENTIAL/PLATELET
Basophils Absolute: 0 10*3/uL (ref 0.0–0.1)
Basophils Relative: 0 %
EOS ABS: 0 10*3/uL (ref 0.0–0.7)
Eosinophils Relative: 0 %
HEMATOCRIT: 46.4 % — AB (ref 36.0–46.0)
HEMOGLOBIN: 15.9 g/dL — AB (ref 12.0–15.0)
LYMPHS ABS: 1.5 10*3/uL (ref 0.7–4.0)
Lymphocytes Relative: 9 %
MCH: 32.6 pg (ref 26.0–34.0)
MCHC: 34.3 g/dL (ref 30.0–36.0)
MCV: 95.1 fL (ref 78.0–100.0)
MONO ABS: 1.3 10*3/uL — AB (ref 0.1–1.0)
MONOS PCT: 8 %
NEUTROS PCT: 83 %
Neutro Abs: 14 10*3/uL — ABNORMAL HIGH (ref 1.7–7.7)
Platelets: 305 10*3/uL (ref 150–400)
RBC: 4.88 MIL/uL (ref 3.87–5.11)
RDW: 13.5 % (ref 11.5–15.5)
WBC: 16.9 10*3/uL — ABNORMAL HIGH (ref 4.0–10.5)

## 2015-12-09 LAB — CREATININE, SERUM
CREATININE: 1.4 mg/dL — AB (ref 0.44–1.00)
GFR calc Af Amer: 46 mL/min — ABNORMAL LOW (ref >60)
GFR calc non Af Amer: 39 mL/min — ABNORMAL LOW (ref >60)

## 2015-12-09 LAB — URINALYSIS, ROUTINE W REFLEX MICROSCOPIC
Bilirubin Urine: NEGATIVE
Glucose, UA: NEGATIVE mg/dL
Ketones, ur: NEGATIVE mg/dL
Nitrite: NEGATIVE
PROTEIN: NEGATIVE mg/dL
SPECIFIC GRAVITY, URINE: 1.014 (ref 1.005–1.030)
pH: 6 (ref 5.0–8.0)

## 2015-12-09 LAB — COMPREHENSIVE METABOLIC PANEL
ALT: 22 U/L (ref 14–54)
AST: 22 U/L (ref 15–41)
Albumin: 4.1 g/dL (ref 3.5–5.0)
Alkaline Phosphatase: 88 U/L (ref 38–126)
Anion gap: 9 (ref 5–15)
BUN: 13 mg/dL (ref 6–20)
CO2: 23 mmol/L (ref 22–32)
CREATININE: 1.23 mg/dL — AB (ref 0.44–1.00)
Calcium: 9.4 mg/dL (ref 8.9–10.3)
Chloride: 103 mmol/L (ref 101–111)
GFR calc Af Amer: 53 mL/min — ABNORMAL LOW (ref >60)
GFR calc non Af Amer: 46 mL/min — ABNORMAL LOW (ref >60)
Glucose, Bld: 122 mg/dL — ABNORMAL HIGH (ref 65–99)
Potassium: 3.9 mmol/L (ref 3.5–5.1)
SODIUM: 135 mmol/L (ref 135–145)
Total Bilirubin: 1.1 mg/dL (ref 0.3–1.2)
Total Protein: 7.5 g/dL (ref 6.5–8.1)

## 2015-12-09 LAB — URINE MICROSCOPIC-ADD ON

## 2015-12-09 LAB — CBC
HCT: 43.1 % (ref 36.0–46.0)
HEMOGLOBIN: 14.5 g/dL (ref 12.0–15.0)
MCH: 32.4 pg (ref 26.0–34.0)
MCHC: 33.6 g/dL (ref 30.0–36.0)
MCV: 96.4 fL (ref 78.0–100.0)
Platelets: 281 10*3/uL (ref 150–400)
RBC: 4.47 MIL/uL (ref 3.87–5.11)
RDW: 13.7 % (ref 11.5–15.5)
WBC: 22.4 10*3/uL — ABNORMAL HIGH (ref 4.0–10.5)

## 2015-12-09 LAB — LIPASE, BLOOD: Lipase: 14 U/L (ref 11–51)

## 2015-12-09 SURGERY — CYSTOURETEROSCOPY, WITH STENT INSERTION
Anesthesia: General | Site: Ureter | Laterality: Right

## 2015-12-09 MED ORDER — LACTATED RINGERS IV SOLN: INTRAVENOUS | Status: DC

## 2015-12-09 MED ORDER — DIATRIZOATE MEGLUMINE 30 % UR SOLN
URETHRAL | Status: AC
  Filled 2015-12-09: qty 100

## 2015-12-09 MED ORDER — HYDROCHLOROTHIAZIDE 25 MG PO TABS: 25.0000 mg | ORAL_TABLET | Freq: Every day | ORAL | Status: DC | PRN

## 2015-12-09 MED ORDER — MORPHINE SULFATE (PF) 4 MG/ML IV SOLN
4.0000 mg | Freq: Once | INTRAVENOUS | Status: AC
  Administered 2015-12-09: 4 mg via INTRAVENOUS
  Filled 2015-12-09: qty 1

## 2015-12-09 MED ORDER — MIDAZOLAM HCL 2 MG/2ML IJ SOLN
INTRAMUSCULAR | Status: AC
  Filled 2015-12-09: qty 2

## 2015-12-09 MED ORDER — PROPOFOL 10 MG/ML IV BOLUS
INTRAVENOUS | Status: AC
  Filled 2015-12-09: qty 20

## 2015-12-09 MED ORDER — SODIUM CHLORIDE 0.9 % IV BOLUS (SEPSIS)
500.0000 mL | Freq: Once | INTRAVENOUS | Status: AC
  Administered 2015-12-09: 500 mL via INTRAVENOUS

## 2015-12-09 MED ORDER — HYDROMORPHONE HCL 1 MG/ML IJ SOLN: 0.2500 mg | INTRAMUSCULAR | Status: DC | PRN

## 2015-12-09 MED ORDER — ONDANSETRON HCL 4 MG/2ML IJ SOLN
INTRAMUSCULAR | Status: AC
  Filled 2015-12-09: qty 2

## 2015-12-09 MED ORDER — SODIUM CHLORIDE 0.45 % IV SOLN
INTRAVENOUS | Status: DC
  Administered 2015-12-09: 19:00:00 via INTRAVENOUS

## 2015-12-09 MED ORDER — PROPOFOL 10 MG/ML IV BOLUS
INTRAVENOUS | Status: DC | PRN
  Administered 2015-12-09: 150 mg via INTRAVENOUS

## 2015-12-09 MED ORDER — DEXAMETHASONE SODIUM PHOSPHATE 10 MG/ML IJ SOLN
INTRAMUSCULAR | Status: AC
  Filled 2015-12-09: qty 1

## 2015-12-09 MED ORDER — ACETAMINOPHEN 325 MG PO TABS
650.0000 mg | ORAL_TABLET | Freq: Once | ORAL | Status: DC
  Filled 2015-12-09: qty 2

## 2015-12-09 MED ORDER — ONDANSETRON HCL 4 MG/2ML IJ SOLN
4.0000 mg | Freq: Once | INTRAMUSCULAR | Status: AC
  Administered 2015-12-09: 4 mg via INTRAVENOUS
  Filled 2015-12-09: qty 2

## 2015-12-09 MED ORDER — DOCUSATE SODIUM 100 MG PO CAPS
100.0000 mg | ORAL_CAPSULE | Freq: Two times a day (BID) | ORAL | Status: DC
  Administered 2015-12-09 – 2015-12-11 (×4): 100 mg via ORAL
  Filled 2015-12-09 (×4): qty 1

## 2015-12-09 MED ORDER — OXYBUTYNIN CHLORIDE 5 MG PO TABS
5.0000 mg | ORAL_TABLET | Freq: Three times a day (TID) | ORAL | Status: DC | PRN
  Administered 2015-12-09 – 2015-12-10 (×2): 5 mg via ORAL
  Filled 2015-12-09: qty 1

## 2015-12-09 MED ORDER — LACTATED RINGERS IV SOLN
INTRAVENOUS | Status: DC | PRN
  Administered 2015-12-09: 16:00:00 via INTRAVENOUS

## 2015-12-09 MED ORDER — METOCLOPRAMIDE HCL 5 MG/ML IJ SOLN: 10.0000 mg | Freq: Once | INTRAMUSCULAR | Status: DC | PRN

## 2015-12-09 MED ORDER — ALBUTEROL SULFATE 108 (90 BASE) MCG/ACT IN AEPB: 2.0000 | INHALATION_SPRAY | Freq: Four times a day (QID) | RESPIRATORY_TRACT | Status: DC | PRN

## 2015-12-09 MED ORDER — ONDANSETRON HCL 4 MG/2ML IJ SOLN: 4.0000 mg | INTRAMUSCULAR | Status: DC | PRN

## 2015-12-09 MED ORDER — DEXTROSE 5 % IV SOLN
1.0000 g | INTRAVENOUS | Status: DC
  Administered 2015-12-10 – 2015-12-11 (×2): 1 g via INTRAVENOUS
  Filled 2015-12-09 (×2): qty 10

## 2015-12-09 MED ORDER — ENOXAPARIN SODIUM 40 MG/0.4ML ~~LOC~~ SOLN
40.0000 mg | SUBCUTANEOUS | Status: DC
  Administered 2015-12-10 – 2015-12-11 (×2): 40 mg via SUBCUTANEOUS
  Filled 2015-12-09 (×2): qty 0.4

## 2015-12-09 MED ORDER — HYDROMORPHONE HCL 1 MG/ML IJ SOLN: 0.5000 mg | INTRAMUSCULAR | Status: DC | PRN

## 2015-12-09 MED ORDER — FENTANYL CITRATE (PF) 100 MCG/2ML IJ SOLN
INTRAMUSCULAR | Status: DC | PRN
  Administered 2015-12-09 (×2): 50 ug via INTRAVENOUS

## 2015-12-09 MED ORDER — FLUTICASONE FUROATE-VILANTEROL 100-25 MCG/INH IN AEPB
1.0000 | INHALATION_SPRAY | Freq: Every day | RESPIRATORY_TRACT | Status: DC
  Administered 2015-12-10 – 2015-12-11 (×2): 1 via RESPIRATORY_TRACT
  Filled 2015-12-09: qty 28

## 2015-12-09 MED ORDER — LIDOCAINE HCL (CARDIAC) 20 MG/ML IV SOLN
INTRAVENOUS | Status: DC | PRN
  Administered 2015-12-09: 30 mg via INTRAVENOUS

## 2015-12-09 MED ORDER — DEXTROSE 5 % IV SOLN
1.0000 g | Freq: Once | INTRAVENOUS | Status: AC
  Administered 2015-12-09: 1 g via INTRAVENOUS
  Filled 2015-12-09: qty 10

## 2015-12-09 MED ORDER — MIDAZOLAM HCL 5 MG/5ML IJ SOLN
INTRAMUSCULAR | Status: DC | PRN
  Administered 2015-12-09: 2 mg via INTRAVENOUS

## 2015-12-09 MED ORDER — ALBUTEROL SULFATE (2.5 MG/3ML) 0.083% IN NEBU: 2.5000 mg | INHALATION_SOLUTION | Freq: Four times a day (QID) | RESPIRATORY_TRACT | Status: DC | PRN

## 2015-12-09 MED ORDER — ATORVASTATIN CALCIUM 40 MG PO TABS: 40.0000 mg | ORAL_TABLET | Freq: Every day | ORAL | Status: DC

## 2015-12-09 MED ORDER — OXYCODONE HCL 5 MG PO TABS: 5.0000 mg | ORAL_TABLET | ORAL | Status: DC | PRN

## 2015-12-09 MED ORDER — DEXAMETHASONE SODIUM PHOSPHATE 10 MG/ML IJ SOLN
INTRAMUSCULAR | Status: DC | PRN
  Administered 2015-12-09: 10 mg via INTRAVENOUS

## 2015-12-09 MED ORDER — MEPERIDINE HCL 50 MG/ML IJ SOLN: 6.2500 mg | INTRAMUSCULAR | Status: DC | PRN

## 2015-12-09 MED ORDER — HYDROMORPHONE HCL 1 MG/ML IJ SOLN
INTRAMUSCULAR | Status: AC
  Filled 2015-12-09: qty 1

## 2015-12-09 MED ORDER — FENTANYL CITRATE (PF) 100 MCG/2ML IJ SOLN
INTRAMUSCULAR | Status: AC
  Filled 2015-12-09: qty 2

## 2015-12-09 MED ORDER — ACETAMINOPHEN 325 MG PO TABS
650.0000 mg | ORAL_TABLET | ORAL | Status: DC | PRN
  Administered 2015-12-10: 650 mg via ORAL
  Filled 2015-12-09: qty 2

## 2015-12-09 MED ORDER — OXYBUTYNIN CHLORIDE 5 MG PO TABS
ORAL_TABLET | ORAL | Status: AC
  Administered 2015-12-09: 5 mg via ORAL
  Filled 2015-12-09: qty 1

## 2015-12-09 MED ORDER — ONDANSETRON HCL 4 MG/2ML IJ SOLN
INTRAMUSCULAR | Status: DC | PRN
  Administered 2015-12-09: 4 mg via INTRAVENOUS

## 2015-12-09 SURGICAL SUPPLY — 19 items
BAG URO CATCHER STRL LF (MISCELLANEOUS) ×2 IMPLANT
BASKET LASER NITINOL 1.9FR (BASKET) IMPLANT
BASKET ZERO TIP NITINOL 2.4FR (BASKET) IMPLANT
CATH INTERMIT  6FR 70CM (CATHETERS) IMPLANT
CLOTH BEACON ORANGE TIMEOUT ST (SAFETY) ×2 IMPLANT
FIBER LASER FLEXIVA 365 (UROLOGICAL SUPPLIES) IMPLANT
FIBER LASER TRAC TIP (UROLOGICAL SUPPLIES) IMPLANT
GLOVE BIOGEL M 8.0 STRL (GLOVE) ×2 IMPLANT
GOWN STRL REUS W/ TWL XL LVL3 (GOWN DISPOSABLE) ×1 IMPLANT
GOWN STRL REUS W/TWL LRG LVL3 (GOWN DISPOSABLE) ×4 IMPLANT
GOWN STRL REUS W/TWL XL LVL3 (GOWN DISPOSABLE) ×1
GUIDEWIRE ANG ZIPWIRE 038X150 (WIRE) ×2 IMPLANT
GUIDEWIRE STR DUAL SENSOR (WIRE) ×2 IMPLANT
IV NS 1000ML (IV SOLUTION) ×1
IV NS 1000ML BAXH (IV SOLUTION) ×1 IMPLANT
MANIFOLD NEPTUNE II (INSTRUMENTS) ×2 IMPLANT
PACK CYSTO (CUSTOM PROCEDURE TRAY) ×2 IMPLANT
STENT URET 6FRX24 CONTOUR (STENTS) ×2 IMPLANT
TUBING CONNECTING 10 (TUBING) ×2 IMPLANT

## 2015-12-09 NOTE — ED Provider Notes (Signed)
13:05- She arrived as a transfer to see Urology for possible stenting. At this time, her pain is controlled. Urology has been paged by nursing.  Daleen Bo, MD 12/09/15 1316

## 2015-12-09 NOTE — ED Notes (Signed)
MD at bedside.  [facility] in to assess pt

## 2015-12-09 NOTE — ED Notes (Signed)
Patient transported to Ultrasound 

## 2015-12-09 NOTE — H&P (Signed)
H&P  Chief Complaint: kidney stone  History of Present Illness: Marissa Willis is a 60 y.o. year old female with a prior history of urolithiasis who went to [facility] today with right flank pain, nausea, right abdominal pain.  Did have shakes and chills.  Was found to have infected appearing urine, large right renal stone burden with a large stone in her right UPJ.  She also had gas in her collecting system.  Because of the size of the patient's stone burden, as well as the presence of an infection, she is admitted for urgent management of this stone with cystoscopy and stent placement.  Past Medical History  Diagnosis Date  . Asthma   . COPD (chronic obstructive pulmonary disease) ([place])   . Kidney stone   . Sleep apnea     Past Surgical History  Procedure Laterality Date  . Tubal ligation    . Incontinence surgery  06/18/89  . 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 Coiling brain an      Home Medications:   (Not in a hospital admission)  Allergies: No Known Allergies  Family History  Problem Relation Age of Onset  . Diabetes Mother   . Heart disease Mother   . Hypertension Mother   . Heart disease Father     Social History:  reports that she has quit smoking. Her smoking use included Cigarettes. She started smoking about 2 years ago. She has a 67.5 pack-year smoking history. She has never used smokeless tobacco. She reports that she drinks alcohol. She reports that she uses illicit drugs (Marijuana).  ROS: A complete review of systems was performed.  All systems are negative except for pertinent findings as noted.  Physical Exam:  Vital signs in last 24 hours: Temp:  [99 F (37.2 C)-103.1 F (39.5 C)] 99 F (37.2 C) (06/23 1548) Pulse Rate:  [72-103] 80 (06/23 1548) Resp:  [18-20] 20 (06/23 1548) BP: (106-153)/(53-79) 106/53 mmHg (06/23 1548) SpO2:  [88 %-98 %] 91 % (06/23 1548) Weight:  [72.576 kg (160 lb)] 72.576 kg  (160 lb) (06/23 1256) General:  Alert and oriented, No acute distress HEENT: Normocephalic, atraumatic Neck: No JVD or lymphadenopathy Cardiovascular: Regular rate and rhythm Lungs: Clear bilaterally Abdomen: Soft, right lower quadrant and right CVA tenderness. Back: No CVA tenderness Extremities: No edema Neurologic: Grossly intact  Laboratory Data:  Results for orders placed or performed during the hospital encounter of 12/09/15 (from the past 24 hour(s))  Comprehensive metabolic panel     Status: Abnormal   Collection Time: 12/09/15  8:20 AM  Result Value Ref Range   Sodium 135 135 - 145 mmol/L   Potassium 3.9 3.5 - 5.1 mmol/L   Chloride 103 101 - 111 mmol/L   CO2 23 22 - 32 mmol/L   Glucose, Bld 122 (H) 65 - 99 mg/dL   BUN 13 6 - 20 mg/dL   Creatinine, Ser 1.23 (H) 0.44 - 1.00 mg/dL   Calcium 9.4 8.9 - 10.3 mg/dL   Total Protein 7.5 6.5 - 8.1 g/dL   Albumin 4.1 3.5 - 5.0 g/dL   AST 22 15 - 41 U/L   ALT 22 14 - 54 U/L   Alkaline Phosphatase 88 38 - 126 U/L   Total Bilirubin 1.1 0.3 - 1.2 mg/dL   GFR calc non Af Amer 46 (L) >60 mL/min   GFR calc Af Amer 53 (L) >60 mL/min   Anion gap 9  5 - 15  Lipase, blood     Status: None   Collection Time: 12/09/15  8:20 AM  Result Value Ref Range   Lipase 14 11 - 51 U/L  CBC with Differential     Status: Abnormal   Collection Time: 12/09/15  8:20 AM  Result Value Ref Range   WBC 16.9 (H) 4.0 - 10.5 K/uL   RBC 4.88 3.87 - 5.11 MIL/uL   Hemoglobin 15.9 (H) 12.0 - 15.0 g/dL   HCT 46.4 (H) 36.0 - 46.0 %   MCV 95.1 78.0 - 100.0 fL   MCH 32.6 26.0 - 34.0 pg   MCHC 34.3 30.0 - 36.0 g/dL   RDW 13.5 11.5 - 15.5 %   Platelets 305 150 - 400 K/uL   Neutrophils Relative % 83 %   Neutro Abs 14.0 (H) 1.7 - 7.7 K/uL   Lymphocytes Relative 9 %   Lymphs Abs 1.5 0.7 - 4.0 K/uL   Monocytes Relative 8 %   Monocytes Absolute 1.3 (H) 0.1 - 1.0 K/uL   Eosinophils Relative 0 %   Eosinophils Absolute 0.0 0.0 - 0.7 K/uL   Basophils Relative 0 %    Basophils Absolute 0.0 0.0 - 0.1 K/uL  Urinalysis, Routine w reflex microscopic     Status: Abnormal   Collection Time: 12/09/15  8:25 AM  Result Value Ref Range   Color, Urine YELLOW YELLOW   APPearance CLEAR CLEAR   Specific Gravity, Urine 1.014 1.005 - 1.030   pH 6.0 5.0 - 8.0   Glucose, UA NEGATIVE NEGATIVE mg/dL   Hgb urine dipstick LARGE (A) NEGATIVE   Bilirubin Urine NEGATIVE NEGATIVE   Ketones, ur NEGATIVE NEGATIVE mg/dL   Protein, ur NEGATIVE NEGATIVE mg/dL   Nitrite NEGATIVE NEGATIVE   Leukocytes, UA SMALL (A) NEGATIVE  Urine microscopic-add on     Status: Abnormal   Collection Time: 12/09/15  8:25 AM  Result Value Ref Range   Squamous Epithelial / LPF 6-30 (A) NONE SEEN   WBC, UA 6-30 0 - 5 WBC/hpf   RBC / HPF 6-30 0 - 5 RBC/hpf   Bacteria, UA MANY (A) NONE SEEN   No results found for this or any previous visit (from the past 240 hour(s)). Creatinine:  Recent Labs  12/09/15 0820  CREATININE 1.23*    Radiologic Imaging: US Abdomen Complete  12/09/2015  CLINICAL DATA:  Right upper quadrant and right flank pain beginning last night. History of urinary tract stones. EXAM: ABDOMEN ULTRASOUND COMPLETE COMPARISON:  CT abdomen and pelvis 09/24/2014. Renal ultrasound 09/26/2014. FINDINGS: Gallbladder: No gallstones or wall thickening visualized. No sonographic Murphy sign noted by sonographer. Common bile duct: Diameter: 0.2 cm Liver: 1.5 cm cyst is noted. There is also a 2.6 cm in diameter hyperechoic lesion in the right lobe most consistent with a hemangioma. Within normal limits in parenchymal echogenicity. IVC: No abnormality visualized. Pancreas: Visualized portion unremarkable. Spleen: Size and appearance within normal limits. Right Kidney: Length: 12.8 cm. Mild to moderate right hydronephrosis is seen. A stone in the lower pole measures 1.8 cm in diameter. Left Kidney: Length: 12.8 cm. Echogenicity within normal limits. No mass or hydronephrosis visualized. Abdominal aorta:  No aneurysm visualized. Other findings: None. IMPRESSION: Mild to moderate right hydronephrosis. Cause for obstruction is not identified. CT abdomen and pelvis without contrast could be used for further evaluation. 1.8 cm nonobstructing stone lower pole right kidney. Negative for gallstones. Electronically Signed   By: Inge Rise M.D.   On: 12/09/2015 09:16  Ct Renal Stone Study  12/09/2015  CLINICAL DATA:  Acute onset of right-sided flank pain yesterday. History kidney stones. Last stone 1 year ago. Hematuria today. EXAM: CT ABDOMEN AND PELVIS WITHOUT CONTRAST TECHNIQUE: Multidetector CT imaging of the abdomen and pelvis was performed following the standard protocol without IV contrast. COMPARISON:  09/24/2014 CT and 12/09/2015 ultrasound. FINDINGS: Lower chest: Clear lung bases. Normal heart size without pericardial or pleural effusion. Multivessel coronary artery atherosclerosis. Hepatobiliary: Mild hepatic steatosis. Hepatomegaly at 18.2 cm craniocaudal. A caudate lobe well-circumscribed low-density 12 mm lesion is likely a cyst. Normal gallbladder, without biliary ductal dilatation. Pancreas: Normal, without mass or ductal dilatation. Spleen: Normal in size, without focal abnormality. Adrenals/Urinary Tract: Normal adrenal glands. No left-sided urinary tract calculi. No bladder calculi. Obstructive stone at the right ureteropelvic junction measures 1.7 cm on image 27/series 2. Smaller stones are identified within the renal pelvis. Moderate right-sided hydronephrosis with perinephric interstitial thickening. Subtle foci of gas identified adjacent to the dominant stone, including on image 26/series 2. No hydroureter identified. Stomach/Bowel: Proximal gastric underdistention. Extensive colonic diverticulosis. Normal terminal ileum and appendix. Normal small bowel. Vascular/Lymphatic: Aortic and branch vessel atherosclerosis. An aortocaval node measures 10 mm and is enlarged from 6 mm previously. Likely  reactive. No pelvic sidewall adenopathy. Reproductive: Normal uterus and adnexa. Other: No significant free fluid. Musculoskeletal: No acute osseous abnormality. Lower thoracic spondylosis. Prominent disc bulges at L4-5 and L5-S1. IMPRESSION: 1. Obstructive stone at the right ureteral pelvic junction with smaller stones within the right renal pelvis. Surrounding perinephric edema. Small foci of gas within the right renal pelvis for which complicating infection and even pyonephrosis cannot be excluded. Consider urology consultation. 2. No left-sided or distal right-sided stones. 3. Hepatic steatosis and hepatomegaly. 4. Age advanced coronary artery atherosclerosis. Recommend assessment of coronary risk factors and consideration of medical therapy. 5.  Aortic atherosclerosis. Electronically Signed   By: Abigail Miyamoto M.D.   On: 12/09/2015 10:02   The above imaging studies were reviewed. Impression/Assessment:  Large right UPJ and renal stones with hydronephrosis and gas in her collecting system consistent with infection  Plan:  Cystoscopy, retrograde, double-J stent placement  Admission for IV antibiotics.  [name] 12/09/2015, 4:40 PM  Lillette Boxer. Mikhala Kenan MD

## 2015-12-09 NOTE — Op Note (Signed)
Preoperative diagnosis: Large right renal and right UPJ stones with probable UTI  Postoperative diagnosis: Same  Principal procedure: Cystoscopy, right retrograde ureteropyelogram with interpretive fluoroscopy, placement of double-J stent and right ureter-24 cm x 6 [place] contour, without string  Surgeon: Jaycen Vercher  Anesthesia: Gen.  Complications: None  Drains: Previously mentioned stent  Indications: 60 year old female with recent presentation to the emergency room for right flank pain and fever. The patient had a leukocytosis. CT stone protocol was performed revealing a very large right UPJ stone, right lower pole stones as well as gas in her right renal pelvis. Because of probability of UTI with this obstructing stone burden-she did have a hydronephrosis, it was recommended that she be admitted to hospital for IV antibiotics, and undergo urgent stent placement for decompression of her right renal unit. She presents at this time for that procedure. I discussed this procedure with the patient and her husband, and they desire to proceed.  Description of procedure: The patient was identified in the holding area and received preoperative IV antibiotics earlier in the day. Her right file was marked to identify the correct surgical side. She was taken to the operating room where general anesthetic was administered. She was placed in the dorsolithotomy position. Genitalia and perineum were prepped and draped. Proper timeout was performed.  A 23 French panendoscope was advanced under direct vision into her bladder. The bladder was systematically inspected and found to be normal. Ureteral orifices were normal in configuration and location. No urothelial abnormalities were noted.  The right ureteral orifice was cannulated with a 6 [place] open-ended catheter. General retrograde ureteropyelogram was performed with Cystografin. This revealed a normal ureter throughout. The ureteropelvic junction was  narrowed, there was an obvious filling defect in this area. There was free flow of the contrast around this into a slightly dilated pyelocalyceal system.  At this point, a 0.038 inch sensor-tip guidewire was advanced through the open-ended catheter, and using fluoroscopic guidance, advanced into the upper pole calyceal system. The open-ended catheter was removed, and using fluoroscopic and cystoscopic guidance, the 24 cm x 6 French double-J stent was advanced over the guidewire. Once a guidewire was removed , excellent proximal distal curls were seen on the stent. At this point, the bladder was drained, the scope removed.  The patient was awakened and taken to the PACU in stable condition. She tolerated the procedure well.

## 2015-12-09 NOTE — ED Provider Notes (Signed)
CSN: [id number]     Arrival date & time 12/09/15  0749 History   First MD Initiated Contact with Patient 12/09/15 [phone]     Chief Complaint  Patient presents with  . Flank Pain     (Consider location/radiation/quality/duration/timing/severity/associated sxs/prior Treatment) HPI Comments: Patient presents with right sided abdominal pain. She has a history of COPD as well as one prior kidney stone about a year ago on the right side. She states this past on its own and she did not follow-up with urology. She states that yesterday evening she felt a little nauseated. She's had no vomiting. She's had some ongoing nausea throughout the night with waxing and waning pain in her right midabdomen radiating to her right back. She denies any urinary symptoms. She had some loose stools yesterday but no diarrhea. No known fevers. Her temperature is 100.1 in the ED this morning. No past abdominal surgeries other than a tubal ligation and surgery for a rectal fistula.  Patient is a 60 y.o. female presenting with flank pain.  Flank Pain Associated symptoms include abdominal pain. Pertinent negatives include no chest pain, no headaches and no shortness of breath.    Past Medical History  Diagnosis Date  . Asthma   . COPD (chronic obstructive pulmonary disease) ([place])   . Kidney stone   . Sleep apnea    Past Surgical History  Procedure Laterality Date  . Tubal ligation    . Incontinence surgery  06/18/89  . 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 Coiling brain an     Family History  Problem Relation Age of Onset  . Diabetes Mother   . Heart disease Mother   . Hypertension Mother   . Heart disease Father    Social History  Substance Use Topics  . Smoking status: Former Smoker -- 1.50 packs/day for 45 years    Types: Cigarettes    Start date: 07/20/2013  . Smokeless tobacco: Never Used  . Alcohol Use: 0.0 oz/week     Comment: 1-2 drinks daily   OB History    No data available     Review of Systems  Constitutional: Negative for fever, chills, diaphoresis and fatigue.  HENT: Negative for congestion, rhinorrhea and sneezing.   Eyes: Negative.   Respiratory: Negative for cough, chest tightness and shortness of breath.   Cardiovascular: Negative for chest pain and leg swelling.  Gastrointestinal: Positive for nausea and abdominal pain. Negative for vomiting, diarrhea and blood in stool.  Genitourinary: Positive for flank pain. Negative for frequency, hematuria and difficulty urinating.  Musculoskeletal: Negative for back pain and arthralgias.  Skin: Negative for rash.  Neurological: Negative for dizziness, speech difficulty, weakness, numbness and headaches.      Allergies  Review of patient's allergies indicates no known allergies.  Home Medications   Prior to Admission medications   Medication Sig Start Date End Date Taking? Authorizing Provider  Albuterol Sulfate (PROAIR RESPICLICK) [id number] (90 Base) MCG/ACT AEPB Inhale 2 puffs into the lungs every 6 (six) hours as needed. 07/06/15   Jade L Breeback, PA-C  aspirin 81 MG tablet Take 81 mg by mouth daily.    Historical Provider, MD  atorvastatin (LIPITOR) 40 MG tablet Take 1 tablet (40 mg total) by mouth daily. 07/06/15   Jade L Breeback, PA-C  Fluticasone Furoate-Vilanterol (BREO ELLIPTA) 100-25 MCG/INH AEPB Inhale 1 puff into the lungs daily. 07/06/15   Jade L Breeback, PA-C  hydrochlorothiazide (HYDRODIURIL) 12.5 MG tablet  Take 2 tablets (25 mg total) by mouth daily. As needed for lower leg swelling. 07/06/15   Jade L Breeback, PA-C   BP 126/79 mmHg  Pulse 72  Temp(Src) 99.9 F (37.7 C) (Oral)  Resp 20  Ht 5\' 4"  (1.626 m)  Wt 160 lb (72.576 kg)  BMI 27.45 kg/m2  SpO2 97% Physical Exam  Constitutional: She is oriented to person, place, and time. She appears well-developed and well-nourished.  HENT:  Head: Normocephalic and atraumatic.  Eyes: Pupils are equal, round, and reactive to  light.  Neck: Normal range of motion. Neck supple.  Cardiovascular: Normal rate, regular rhythm and normal heart sounds.   Pulmonary/Chest: Effort normal and breath sounds normal. No respiratory distress. She has no wheezes. She has no rales. She exhibits no tenderness.  Abdominal: Soft. Bowel sounds are normal. There is tenderness (Positive tenderness in the right upper quadrant). There is no rebound and no guarding.  Musculoskeletal: Normal range of motion. She exhibits no edema.  Lymphadenopathy:    She has no cervical adenopathy.  Neurological: She is alert and oriented to person, place, and time.  Skin: Skin is warm and dry. No rash noted.  Psychiatric: She has a normal mood and affect.    ED Course  Procedures (including critical care time) Labs Review Labs Reviewed  URINALYSIS, ROUTINE W REFLEX MICROSCOPIC (NOT AT [facility]) - Abnormal; Notable for the following:    Hgb urine dipstick LARGE (*)    Leukocytes, UA SMALL (*)    All other components within normal limits  COMPREHENSIVE METABOLIC PANEL - Abnormal; Notable for the following:    Glucose, Bld 122 (*)    Creatinine, Ser 1.23 (*)    GFR calc non Af Amer 46 (*)    GFR calc Af Amer 53 (*)    All other components within normal limits  CBC WITH DIFFERENTIAL/PLATELET - Abnormal; Notable for the following:    WBC 16.9 (*)    Hemoglobin 15.9 (*)    HCT 46.4 (*)    Neutro Abs 14.0 (*)    Monocytes Absolute 1.3 (*)    All other components within normal limits  URINE MICROSCOPIC-ADD ON - Abnormal; Notable for the following:    Squamous Epithelial / LPF 6-30 (*)    Bacteria, UA MANY (*)    All other components within normal limits  URINE CULTURE  LIPASE, BLOOD    Imaging Review US Abdomen Complete  12/09/2015  CLINICAL DATA:  Right upper quadrant and right flank pain beginning last night. History of urinary tract stones. EXAM: ABDOMEN ULTRASOUND COMPLETE COMPARISON:  CT abdomen and pelvis 09/24/2014. Renal ultrasound  09/26/2014. FINDINGS: Gallbladder: No gallstones or wall thickening visualized. No sonographic Murphy sign noted by sonographer. Common bile duct: Diameter: 0.2 cm Liver: 1.5 cm cyst is noted. There is also a 2.6 cm in diameter hyperechoic lesion in the right lobe most consistent with a hemangioma. Within normal limits in parenchymal echogenicity. IVC: No abnormality visualized. Pancreas: Visualized portion unremarkable. Spleen: Size and appearance within normal limits. Right Kidney: Length: 12.8 cm. Mild to moderate right hydronephrosis is seen. A stone in the lower pole measures 1.8 cm in diameter. Left Kidney: Length: 12.8 cm. Echogenicity within normal limits. No mass or hydronephrosis visualized. Abdominal aorta: No aneurysm visualized. Other findings: None. IMPRESSION: Mild to moderate right hydronephrosis. Cause for obstruction is not identified. CT abdomen and pelvis without contrast could be used for further evaluation. 1.8 cm nonobstructing stone lower pole right kidney. Negative for gallstones.  Electronically Signed   By: Inge Rise M.D.   On: 12/09/2015 09:16   Ct Renal Stone Study  12/09/2015  CLINICAL DATA:  Acute onset of right-sided flank pain yesterday. History kidney stones. Last stone 1 year ago. Hematuria today. EXAM: CT ABDOMEN AND PELVIS WITHOUT CONTRAST TECHNIQUE: Multidetector CT imaging of the abdomen and pelvis was performed following the standard protocol without IV contrast. COMPARISON:  09/24/2014 CT and 12/09/2015 ultrasound. FINDINGS: Lower chest: Clear lung bases. Normal heart size without pericardial or pleural effusion. Multivessel coronary artery atherosclerosis. Hepatobiliary: Mild hepatic steatosis. Hepatomegaly at 18.2 cm craniocaudal. A caudate lobe well-circumscribed low-density 12 mm lesion is likely a cyst. Normal gallbladder, without biliary ductal dilatation. Pancreas: Normal, without mass or ductal dilatation. Spleen: Normal in size, without focal abnormality.  Adrenals/Urinary Tract: Normal adrenal glands. No left-sided urinary tract calculi. No bladder calculi. Obstructive stone at the right ureteropelvic junction measures 1.7 cm on image 27/series 2. Smaller stones are identified within the renal pelvis. Moderate right-sided hydronephrosis with perinephric interstitial thickening. Subtle foci of gas identified adjacent to the dominant stone, including on image 26/series 2. No hydroureter identified. Stomach/Bowel: Proximal gastric underdistention. Extensive colonic diverticulosis. Normal terminal ileum and appendix. Normal small bowel. Vascular/Lymphatic: Aortic and branch vessel atherosclerosis. An aortocaval node measures 10 mm and is enlarged from 6 mm previously. Likely reactive. No pelvic sidewall adenopathy. Reproductive: Normal uterus and adnexa. Other: No significant free fluid. Musculoskeletal: No acute osseous abnormality. Lower thoracic spondylosis. Prominent disc bulges at L4-5 and L5-S1. IMPRESSION: 1. Obstructive stone at the right ureteral pelvic junction with smaller stones within the right renal pelvis. Surrounding perinephric edema. Small foci of gas within the right renal pelvis for which complicating infection and even pyonephrosis cannot be excluded. Consider urology consultation. 2. No left-sided or distal right-sided stones. 3. Hepatic steatosis and hepatomegaly. 4. Age advanced coronary artery atherosclerosis. Recommend assessment of coronary risk factors and consideration of medical therapy. 5.  Aortic atherosclerosis. Electronically Signed   By: Abigail Miyamoto M.D.   On: 12/09/2015 10:02   I have personally reviewed and evaluated these images and lab results as part of my medical decision-making.   EKG Interpretation None      MDM   Final diagnoses:  None    Patient presents with pain to the right flank area. It seemed to be more in the right upper quadrant and different from her prior renal colic. I got an ultrasound of her  gallbladder which was well. There was some noted hydronephrosis on the right. A CT scan was obtained which shows a 1.7 cm stone at the right UPJ. This perinephric edema as well as hydronephrosis and a foci of gas in the right renal pelvis concerning for infection. Her urine shows evidence of infection but it also appears to be a dirty specimen. She does have an elevated temperature to 100.1 here. She also has an elevated white count 16,000. I will consult urology. She was given a dose of Rocephin and a urine culture was sent. She has no tachycardia, hypotension or other SIRS criteria for sepsis.  I spoke with Dr. Diona Fanti who requests pt be transferred to the ED at [facility] for stent placement.  I notified the EDP, Dr. Audie Pinto of the transfer as well.  Malvin Johns, MD 12/09/15 1114

## 2015-12-09 NOTE — Anesthesia Procedure Notes (Signed)
Procedure Name: LMA Insertion Date/Time: 12/09/2015 5:05 PM Performed by: Lajuana Carry E Pre-anesthesia Checklist: Patient identified, Emergency Drugs available, Suction available and Patient being monitored Patient Re-evaluated:Patient Re-evaluated prior to inductionOxygen Delivery Method: Circle system utilized Preoxygenation: Pre-oxygenation with 100% oxygen Intubation Type: IV induction Ventilation: Mask ventilation without difficulty LMA: LMA inserted LMA Size: 4.0 Number of attempts: 1 Placement Confirmation: positive ETCO2 and breath sounds checked- equal and bilateral Dental Injury: Teeth and Oropharynx as per pre-operative assessment

## 2015-12-09 NOTE — Anesthesia Preprocedure Evaluation (Addendum)
Anesthesia Evaluation  Patient identified by MRN, date of birth, ID band Patient awake    Reviewed: Allergy & Precautions, NPO status , Patient's Chart, lab work & pertinent test results  Airway Mallampati: I  TM Distance: >3 FB Neck ROM: Full    Dental no notable dental hx. (+) Teeth Intact   Pulmonary asthma , sleep apnea , COPD,  COPD inhaler, former smoker,    Pulmonary exam normal breath sounds clear to auscultation       Cardiovascular + Peripheral Vascular Disease  Normal cardiovascular exam Rhythm:Regular Rate:Normal     Neuro/Psych negative neurological ROS  negative psych ROS   GI/Hepatic negative GI ROS, Neg liver ROS,   Endo/Other  Hyperlipidemia  Renal/GU Renal InsufficiencyRenal diseaseRight ureteral calculus with obstruction and infection     Musculoskeletal negative musculoskeletal ROS (+)   Abdominal   Peds  Hematology negative hematology ROS (+)   Anesthesia Other Findings   Reproductive/Obstetrics                            Anesthesia Physical Anesthesia Plan  ASA: II and emergent  Anesthesia Plan: General   Post-op Pain Management:    Induction: Intravenous  Airway Management Planned: LMA  Additional Equipment:   Intra-op Plan:   Post-operative Plan: Extubation in OR  Informed Consent: I have reviewed the patients History and Physical, chart, labs and discussed the procedure including the risks, benefits and alternatives for the proposed anesthesia with the patient or authorized representative who has indicated his/her understanding and acceptance.   Dental advisory given  Plan Discussed with: CRNA, Anesthesiologist and Surgeon  Anesthesia Plan Comments:         Anesthesia Quick Evaluation

## 2015-12-09 NOTE — ED Notes (Signed)
Pt care transferred to [facility] staff at bedside. Report to [place], rn. Pt is aware of pending transport to [facility] for urology consult.

## 2015-12-09 NOTE — ED Notes (Signed)
Report given to JC with [facility].  

## 2015-12-09 NOTE — Anesthesia Postprocedure Evaluation (Signed)
Anesthesia Post Note  Patient: Marissa Willis  Procedure(s) Performed: Procedure(s) (LRB): CYSTOSCOPY WITH retrograde AND STENT PLACEMENT (Right)  Patient location during evaluation: PACU Anesthesia Type: General Level of consciousness: awake and alert and oriented Pain management: pain level controlled Vital Signs Assessment: post-procedure vital signs reviewed and stable Respiratory status: spontaneous breathing, nonlabored ventilation and respiratory function stable Cardiovascular status: blood pressure returned to baseline and stable Postop Assessment: no signs of nausea or vomiting Anesthetic complications: no    Last Vitals:  Filed Vitals:   12/09/15 1745 12/09/15 1800  BP: 130/76 110/61  Pulse: 78 78  Temp:  36.9 C  Resp: 17 22    Last Pain:  Filed Vitals:   12/09/15 1810  PainSc: 0-No pain                 Messiah Ahr A.

## 2015-12-09 NOTE — Transfer of Care (Signed)
Immediate Anesthesia Transfer of Care Note  Patient: Marissa Willis  Procedure(s) Performed: Procedure(s): CYSTOSCOPY WITH retrograde AND STENT PLACEMENT (Right)  Patient Location: PACU  Anesthesia Type:General  Level of Consciousness:  sedated, patient cooperative and responds to stimulation  Airway & Oxygen Therapy:Patient Spontanous Breathing and Patient connected to face mask oxgen  Post-op Assessment:  Report given to PACU RN and Post -op Vital signs reviewed and stable  Post vital signs:  Reviewed and stable  Last Vitals:  Filed Vitals:   12/09/15 1258 12/09/15 1548  BP: 125/78 106/53  Pulse: 100 80  Temp: 38.8 C 37.2 C  Resp: 18 20    Complications: No apparent anesthesia complications

## 2015-12-09 NOTE — ED Notes (Signed)
Pt amb to room 6 with quick steady gait in nad. Pt smiling, reports right sided flank pain radiating to right lower abd/pelvis, states this feels like the kidney stone pain she had last year. Denies fevers, dysuria.

## 2015-12-09 NOTE — ED Notes (Signed)
[facility] transported [facility].  Pt reports pain is not so bad at this time.  Here to see Urology for possible surgery.  Pt is A&Ox 4.  1 belonging bag noted with pt along with off-white sandals.

## 2015-12-10 NOTE — Progress Notes (Signed)
1 Day Post-Op Subjective: Patient reports that she feels well. She had shakes and sweats last night, but is better now. Tolerating food well.  Objective: Vital signs in last 24 hours: Temp:  [98 F (36.7 C)-103.1 F (39.5 C)] 98.5 F (36.9 C) (06/24 0452) Pulse Rate:  [59-103] 59 (06/24 0452) Resp:  [17-25] 18 (06/24 0452) BP: (106-140)/(53-79) 123/62 mmHg (06/24 0452) SpO2:  [88 %-100 %] 91 % (06/24 0745) Weight:  [72.576 kg (160 lb)-81.103 kg (178 lb 12.8 oz)] 81.103 kg (178 lb 12.8 oz) (06/23 1904)  Intake/Output from previous day: 06/23 0701 - 06/24 0700 In: 1268.8 [I.V.:1268.8] Out: 1950 [Urine:1950] Intake/Output this shift: Total I/O In: 240 [P.O.:240] Out: -   Physical Exam:  Constitutional: Vital signs reviewed. WD WN in NAD   Eyes: PERRL, No scleral icterus.   Cardiovascular: RRR Pulmonary/Chest: Normal effort    Lab Results:  Recent Labs  12/09/15 0820 12/09/15 1915  HGB 15.9* 14.5  HCT 46.4* 43.1   BMET  Recent Labs  12/09/15 0820 12/09/15 1915  NA 135  --   K 3.9  --   CL 103  --   CO2 23  --   GLUCOSE 122*  --   BUN 13  --   CREATININE 1.23* 1.40*  CALCIUM 9.4  --    No results for input(s): LABPT, INR in the last 72 hours. No results for input(s): LABURIN in the last 72 hours. Results for orders placed or performed during the hospital encounter of 09/26/14  Urine culture     Status: None   Collection Time: 09/26/14  6:40 PM  Result Value Ref Range Status   Specimen Description URINE, CLEAN CATCH  Final   Special Requests NONE  Final   Colony Count   Final    >=100,000 COLONIES/ML Performed at [facility]    Culture   Final    ESCHERICHIA COLI Performed at [facility]    Report Status 09/28/2014 FINAL  Final   Organism ID, Bacteria ESCHERICHIA COLI  Final      Susceptibility   Escherichia coli - MIC*    AMPICILLIN <=2 SENSITIVE Sensitive     CEFAZOLIN <=4 SENSITIVE Sensitive     CEFTRIAXONE <=1 SENSITIVE  Sensitive     CIPROFLOXACIN <=0.25 SENSITIVE Sensitive     GENTAMICIN <=1 SENSITIVE Sensitive     LEVOFLOXACIN <=0.12 SENSITIVE Sensitive     NITROFURANTOIN <=16 SENSITIVE Sensitive     TOBRAMYCIN <=1 SENSITIVE Sensitive     TRIMETH/SULFA <=20 SENSITIVE Sensitive     PIP/TAZO <=4 SENSITIVE Sensitive     * ESCHERICHIA COLI  Blood culture (routine x 2)     Status: None   Collection Time: 09/26/14  8:54 PM  Result Value Ref Range Status   Specimen Description BLOOD RIGHT HAND  Final   Special Requests BOTTLES DRAWN AEROBIC AND ANAEROBIC 7ML EACH  Final   Culture   Final    NO GROWTH 5 DAYS Performed at [facility]    Report Status 10/03/2014 FINAL  Final  Blood culture (routine x 2)     Status: None   Collection Time: 09/26/14  9:00 PM  Result Value Ref Range Status   Specimen Description BLOOD LEFT ARM  Final   Special Requests BOTTLES DRAWN AEROBIC AND ANAEROBIC 5ML EACH  Final   Culture   Final    NO GROWTH 5 DAYS Performed at [facility]    Report Status 10/03/2014 FINAL  Final  Studies/Results: US Abdomen Complete  12/09/2015  CLINICAL DATA:  Right upper quadrant and right flank pain beginning last night. History of urinary tract stones. EXAM: ABDOMEN ULTRASOUND COMPLETE COMPARISON:  CT abdomen and pelvis 09/24/2014. Renal ultrasound 09/26/2014. FINDINGS: Gallbladder: No gallstones or wall thickening visualized. No sonographic Murphy sign noted by sonographer. Common bile duct: Diameter: 0.2 cm Liver: 1.5 cm cyst is noted. There is also a 2.6 cm in diameter hyperechoic lesion in the right lobe most consistent with a hemangioma. Within normal limits in parenchymal echogenicity. IVC: No abnormality visualized. Pancreas: Visualized portion unremarkable. Spleen: Size and appearance within normal limits. Right Kidney: Length: 12.8 cm. Mild to moderate right hydronephrosis is seen. A stone in the lower pole measures 1.8 cm in diameter. Left Kidney: Length: 12.8 cm.  Echogenicity within normal limits. No mass or hydronephrosis visualized. Abdominal aorta: No aneurysm visualized. Other findings: None. IMPRESSION: Mild to moderate right hydronephrosis. Cause for obstruction is not identified. CT abdomen and pelvis without contrast could be used for further evaluation. 1.8 cm nonobstructing stone lower pole right kidney. Negative for gallstones. Electronically Signed   By: Inge Rise M.D.   On: 12/09/2015 09:16   Ct Renal Stone Study  12/09/2015  CLINICAL DATA:  Acute onset of right-sided flank pain yesterday. History kidney stones. Last stone 1 year ago. Hematuria today. EXAM: CT ABDOMEN AND PELVIS WITHOUT CONTRAST TECHNIQUE: Multidetector CT imaging of the abdomen and pelvis was performed following the standard protocol without IV contrast. COMPARISON:  09/24/2014 CT and 12/09/2015 ultrasound. FINDINGS: Lower chest: Clear lung bases. Normal heart size without pericardial or pleural effusion. Multivessel coronary artery atherosclerosis. Hepatobiliary: Mild hepatic steatosis. Hepatomegaly at 18.2 cm craniocaudal. A caudate lobe well-circumscribed low-density 12 mm lesion is likely a cyst. Normal gallbladder, without biliary ductal dilatation. Pancreas: Normal, without mass or ductal dilatation. Spleen: Normal in size, without focal abnormality. Adrenals/Urinary Tract: Normal adrenal glands. No left-sided urinary tract calculi. No bladder calculi. Obstructive stone at the right ureteropelvic junction measures 1.7 cm on image 27/series 2. Smaller stones are identified within the renal pelvis. Moderate right-sided hydronephrosis with perinephric interstitial thickening. Subtle foci of gas identified adjacent to the dominant stone, including on image 26/series 2. No hydroureter identified. Stomach/Bowel: Proximal gastric underdistention. Extensive colonic diverticulosis. Normal terminal ileum and appendix. Normal small bowel. Vascular/Lymphatic: Aortic and branch vessel  atherosclerosis. An aortocaval node measures 10 mm and is enlarged from 6 mm previously. Likely reactive. No pelvic sidewall adenopathy. Reproductive: Normal uterus and adnexa. Other: No significant free fluid. Musculoskeletal: No acute osseous abnormality. Lower thoracic spondylosis. Prominent disc bulges at L4-5 and L5-S1. IMPRESSION: 1. Obstructive stone at the right ureteral pelvic junction with smaller stones within the right renal pelvis. Surrounding perinephric edema. Small foci of gas within the right renal pelvis for which complicating infection and even pyonephrosis cannot be excluded. Consider urology consultation. 2. No left-sided or distal right-sided stones. 3. Hepatic steatosis and hepatomegaly. 4. Age advanced coronary artery atherosclerosis. Recommend assessment of coronary risk factors and consideration of medical therapy. 5.  Aortic atherosclerosis. Electronically Signed   By: Abigail Miyamoto M.D.   On: 12/09/2015 10:02    Assessment/Plan:   Postoperative day #1 placement of right ureteral stent for obstructed, infected right renal system. She seems to be doing well. She spiked a fever after her stent yesterday. I don't think she is ready to go yet. We will continue antibiotics intravenously for 1 more day, and if she is afebrile over the next 24 hours, I  will consider sending her home tomorrow.   LOS: 1 day   [name] 12/10/2015, 10:49 AM

## 2015-12-11 LAB — URINE CULTURE

## 2015-12-11 MED ORDER — SULFAMETHOXAZOLE-TRIMETHOPRIM 400-80 MG PO TABS
1.0000 | ORAL_TABLET | Freq: Two times a day (BID) | ORAL | Status: DC
  Administered 2015-12-11: 1 via ORAL
  Filled 2015-12-11 (×2): qty 1

## 2015-12-11 MED ORDER — OXYBUTYNIN CHLORIDE 5 MG PO TABS: 5.0000 mg | ORAL_TABLET | Freq: Three times a day (TID) | ORAL | Status: DC | PRN

## 2015-12-11 MED ORDER — SULFAMETHOXAZOLE-TRIMETHOPRIM 400-80 MG PO TABS: 1.0000 | ORAL_TABLET | Freq: Two times a day (BID) | ORAL | Status: DC

## 2015-12-11 MED ORDER — SULFAMETHOXAZOLE-TRIMETHOPRIM 800-160 MG PO TABS: 1.0000 | ORAL_TABLET | Freq: Two times a day (BID) | ORAL | Status: DC

## 2015-12-11 NOTE — Discharge Summary (Signed)
Patient ID: Stellamarie Monto MRN: [id number] DOB/AGE: [birth year] 62 y.o.  Admit date: 12/09/2015 Discharge date: 12/11/2015  Primary Care Physician:  Iran Planas, PA-C  Discharge Diagnoses:  Pyonephrosis Large right renal calculi Consults:  None     Discharge Medications:   Medication List    TAKE these medications        Albuterol Sulfate 108 (90 Base) MCG/ACT Aepb  Commonly known as:  PROAIR RESPICLICK  Inhale 2 puffs into the lungs every 6 (six) hours as needed.     aspirin 81 MG tablet  Take 81 mg by mouth daily.     atorvastatin 40 MG tablet  Commonly known as:  LIPITOR  Take 1 tablet (40 mg total) by mouth daily.     fluticasone furoate-vilanterol 100-25 MCG/INH Aepb  Commonly known as:  BREO ELLIPTA  Inhale 1 puff into the lungs daily.     hydrochlorothiazide 12.5 MG tablet  Commonly known as:  HYDRODIURIL  Take 2 tablets (25 mg total) by mouth daily. As needed for lower leg swelling.     oxyCODONE-acetaminophen 5-325 MG tablet  Commonly known as:  PERCOCET/ROXICET  Take 1 tablet by mouth every 4 (four) hours as needed for severe pain.         Significant Diagnostic Studies:  US Abdomen Complete  12/09/2015  CLINICAL DATA:  Right upper quadrant and right flank pain beginning last night. History of urinary tract stones. EXAM: ABDOMEN ULTRASOUND COMPLETE COMPARISON:  CT abdomen and pelvis 09/24/2014. Renal ultrasound 09/26/2014. FINDINGS: Gallbladder: No gallstones or wall thickening visualized. No sonographic Murphy sign noted by sonographer. Common bile duct: Diameter: 0.2 cm Liver: 1.5 cm cyst is noted. There is also a 2.6 cm in diameter hyperechoic lesion in the right lobe most consistent with a hemangioma. Within normal limits in parenchymal echogenicity. IVC: No abnormality visualized. Pancreas: Visualized portion unremarkable. Spleen: Size and appearance within normal limits. Right Kidney: Length: 12.8 cm. Mild to moderate right hydronephrosis is seen. A  stone in the lower pole measures 1.8 cm in diameter. Left Kidney: Length: 12.8 cm. Echogenicity within normal limits. No mass or hydronephrosis visualized. Abdominal aorta: No aneurysm visualized. Other findings: None. IMPRESSION: Mild to moderate right hydronephrosis. Cause for obstruction is not identified. CT abdomen and pelvis without contrast could be used for further evaluation. 1.8 cm nonobstructing stone lower pole right kidney. Negative for gallstones. Electronically Signed   By: Inge Rise M.D.   On: 12/09/2015 09:16   Ct Renal Stone Study  12/09/2015  CLINICAL DATA:  Acute onset of right-sided flank pain yesterday. History kidney stones. Last stone 1 year ago. Hematuria today. EXAM: CT ABDOMEN AND PELVIS WITHOUT CONTRAST TECHNIQUE: Multidetector CT imaging of the abdomen and pelvis was performed following the standard protocol without IV contrast. COMPARISON:  09/24/2014 CT and 12/09/2015 ultrasound. FINDINGS: Lower chest: Clear lung bases. Normal heart size without pericardial or pleural effusion. Multivessel coronary artery atherosclerosis. Hepatobiliary: Mild hepatic steatosis. Hepatomegaly at 18.2 cm craniocaudal. A caudate lobe well-circumscribed low-density 12 mm lesion is likely a cyst. Normal gallbladder, without biliary ductal dilatation. Pancreas: Normal, without mass or ductal dilatation. Spleen: Normal in size, without focal abnormality. Adrenals/Urinary Tract: Normal adrenal glands. No left-sided urinary tract calculi. No bladder calculi. Obstructive stone at the right ureteropelvic junction measures 1.7 cm on image 27/series 2. Smaller stones are identified within the renal pelvis. Moderate right-sided hydronephrosis with perinephric interstitial thickening. Subtle foci of gas identified adjacent to the dominant stone, including on image 26/series 2. No hydroureter  identified. Stomach/Bowel: Proximal gastric underdistention. Extensive colonic diverticulosis. Normal terminal ileum  and appendix. Normal small bowel. Vascular/Lymphatic: Aortic and branch vessel atherosclerosis. An aortocaval node measures 10 mm and is enlarged from 6 mm previously. Likely reactive. No pelvic sidewall adenopathy. Reproductive: Normal uterus and adnexa. Other: No significant free fluid. Musculoskeletal: No acute osseous abnormality. Lower thoracic spondylosis. Prominent disc bulges at L4-5 and L5-S1. IMPRESSION: 1. Obstructive stone at the right ureteral pelvic junction with smaller stones within the right renal pelvis. Surrounding perinephric edema. Small foci of gas within the right renal pelvis for which complicating infection and even pyonephrosis cannot be excluded. Consider urology consultation. 2. No left-sided or distal right-sided stones. 3. Hepatic steatosis and hepatomegaly. 4. Age advanced coronary artery atherosclerosis. Recommend assessment of coronary risk factors and consideration of medical therapy. 5.  Aortic atherosclerosis. Electronically Signed   By: Abigail Miyamoto M.D.   On: 12/09/2015 10:02    Brief H and P: For complete details please refer to admission H and P, but in brief the patient was admitted urgently for cystoscopy, right double-J stent placement and antibiotic management. She was diagnosed, upon admission to the emergency room, with obstructing right UPJ stone which was very large, as well as an obvious UTI with gas in her right collecting system.  Hospital Course:  Active Problems:   Renal calculi Urinary tract infection Hydronephrosis, right  The patient was admitted urgently for cystoscopy and stent placement. She was febrile postoperatively, but by hospital day #3, was afebrile, ingrowing an Escherichia coli. Sensitivities were not back at the time of discharge, but she was discharged in improved condition on Bactrim.  Day of Discharge BP 128/80 mmHg  Pulse 80  Temp(Src) 99.5 F (37.5 C) (Oral)  Resp 18  Ht 5\' 4"  (1.626 m)  Wt 81.103 kg (178 lb 12.8 oz)  BMI  30.68 kg/m2  SpO2 95%  No results found for this or any previous visit (from the past 24 hour(s)).  Physical Exam: General: Alert and awake oriented x3 not in any acute distress. HEENT: anicteric sclera, pupils reactive to light and accommodation CVS: S1-S2 clear no murmur rubs or gallops Chest: clear to auscultation bilaterally, no wheezing rales or rhonchi Abdomen: soft nontender, nondistended, normal bowel sounds, no organomegaly Extremities: no cyanosis, clubbing or edema noted bilaterally Neuro: Cranial nerves II-XII intact, no focal neurological deficits  Disposition:  Home  Diet:  No restrictions  Activity:  Increase as tolerated, discussed with patient   Disposition and Follow-up:    Follow-up in the office to discuss eventual percutaneous nephrolithotomy  TESTS THAT NEED FOLLOW-UP  Follow-up culture results  DISCHARGE FOLLOW-UP   Time spent on Discharge:  10 minutes  Signed: [name] 12/11/2015, 6:28 AM

## 2015-12-11 NOTE — Progress Notes (Signed)
Completed D/C teaching. Answered all questions. Pt will be D/C home in stable condition with family.

## 2015-12-11 NOTE — Discharge Instructions (Signed)

## 2015-12-12 ENCOUNTER — Encounter (HOSPITAL_COMMUNITY): Payer: Self-pay | Admitting: Urology

## 2015-12-12 MED FILL — SULFAMETHOXAZOLE-TMP DS TAB: 800-160 | 20 days supply | Qty: 40 | Fill #0

## 2015-12-12 MED FILL — OXYBUTYNIN 5 MG TABLET: 5 | 10 days supply | Qty: 30 | Fill #0

## 2015-12-26 ENCOUNTER — Other Ambulatory Visit: Payer: Self-pay | Admitting: Urology

## 2015-12-26 DIAGNOSIS — N200 Calculus of kidney: Secondary | ICD-10-CM

## 2016-01-10 ENCOUNTER — Encounter (HOSPITAL_COMMUNITY): Payer: Self-pay

## 2016-01-10 ENCOUNTER — Encounter (HOSPITAL_COMMUNITY)
Admission: RE | Admit: 2016-01-10 | Discharge: 2016-01-10 | Disposition: A | Discharge status: Home / Self Care | Payer: BLUE CROSS/BLUE SHIELD | Source: Ambulatory Visit | Attending: Urology | Admitting: Urology

## 2016-01-10 DIAGNOSIS — J449 Chronic obstructive pulmonary disease, unspecified: Secondary | ICD-10-CM | POA: Diagnosis not present

## 2016-01-10 DIAGNOSIS — G4730 Sleep apnea, unspecified: Secondary | ICD-10-CM | POA: Diagnosis not present

## 2016-01-10 DIAGNOSIS — Z87891 Personal history of nicotine dependence: Secondary | ICD-10-CM | POA: Diagnosis not present

## 2016-01-10 DIAGNOSIS — I739 Peripheral vascular disease, unspecified: Secondary | ICD-10-CM | POA: Diagnosis not present

## 2016-01-10 DIAGNOSIS — N200 Calculus of kidney: Secondary | ICD-10-CM | POA: Diagnosis not present

## 2016-01-10 LAB — BASIC METABOLIC PANEL
Anion gap: 5 (ref 5–15)
BUN: 12 mg/dL (ref 6–20)
CALCIUM: 9.2 mg/dL (ref 8.9–10.3)
CHLORIDE: 110 mmol/L (ref 101–111)
CO2: 24 mmol/L (ref 22–32)
CREATININE: 0.72 mg/dL (ref 0.44–1.00)
GFR calc Af Amer: >60 mL/min (ref >60)
GFR calc non Af Amer: >60 mL/min (ref >60)
GLUCOSE: 87 mg/dL (ref 65–99)
Potassium: 3.9 mmol/L (ref 3.5–5.1)
Sodium: 139 mmol/L (ref 135–145)

## 2016-01-10 LAB — CBC
HEMATOCRIT: 43.3 % (ref 36.0–46.0)
Hemoglobin: 14.7 g/dL (ref 12.0–15.0)
MCH: 32.6 pg (ref 26.0–34.0)
MCHC: 33.9 g/dL (ref 30.0–36.0)
MCV: 96 fL (ref 78.0–100.0)
Platelets: 287 10*3/uL (ref 150–400)
RBC: 4.51 MIL/uL (ref 3.87–5.11)
RDW: 13.5 % (ref 11.5–15.5)
WBC: 9 10*3/uL (ref 4.0–10.5)

## 2016-01-10 LAB — ABO/RH: ABO/RH(D): O POS

## 2016-01-10 NOTE — Patient Instructions (Addendum)
Marissa Willis  01/10/2016   Your procedure is scheduled on: 01/12/16  Report to [facility] Main  Entrance take [facility]  elevators to 3rd floor to  [place] at  1030 AM.  Call this number if you have problems the morning of surgery [phone]   Remember: ONLY 1 PERSON MAY GO WITH YOU TO SHORT STAY TO GET  READY MORNING OF [place].  Do not eat food or drink liquids :After Midnight.WED.     Take these medicines the morning of surgery with A SIP OF WATER: none- use inhalers as usual and bring emergency inhaler to hospital Thursday am. Bring CPAP tubing and mask to hospital                                You may not have any metal on your body including hair pins and              piercings  Do not wear jewelry, make-up, lotions, powders or perfumes, deodorant             Do not wear nail polish.  Do not shave  48 hours prior to surgery.                 Do not bring valuables to the hospital. [place].  Contacts, dentures or bridgework may not be worn into surgery.  Leave suitcase in the car. After surgery it may be brought to your room.     Patients discharged the day of surgery will not be allowed to drive home.  Name and phone number of your driver:  Special Instructions: N/A              Please read over the following fact sheets you were given: _____________________________________________________________________             [facility] - Preparing for Surgery Before surgery, you can play an important role.  Because skin is not sterile, your skin needs to be as free of germs as possible.  You can reduce the number of germs on your skin by washing with CHG (chlorahexidine gluconate) soap before surgery.  CHG is an antiseptic cleaner which kills germs and bonds with the skin to continue killing germs even after washing. Please DO NOT use if you have an allergy to CHG or antibacterial soaps.   If your skin becomes reddened/irritated stop using the CHG and inform your nurse when you arrive at Short Stay. Do not shave (including legs and underarms) for at least 48 hours prior to the first CHG shower.  You may shave your face/neck. Please follow these instructions carefully:  1.  Shower with CHG Soap the night before surgery and the  morning of Surgery.  2.  If you choose to wash your hair, wash your hair first as usual with your  normal  shampoo.  3.  After you shampoo, rinse your hair and body thoroughly to remove the  shampoo.                           4.  Use CHG as you would any other liquid soap.  You can apply chg directly  to the  skin and wash                       Gently with a scrungie or clean washcloth.  5.  Apply the CHG Soap to your body ONLY FROM THE NECK DOWN.   Do not use on face/ open                           Wound or open sores. Avoid contact with eyes, ears mouth and genitals (private parts).                       Wash face,  Genitals (private parts) with your normal soap.             6.  Wash thoroughly, paying special attention to the area where your surgery  will be performed.  7.  Thoroughly rinse your body with warm water from the neck down.  8.  DO NOT shower/wash with your normal soap after using and rinsing off  the CHG Soap.                9.  Pat yourself dry with a clean towel.            10.  Wear clean pajamas.            11.  Place clean sheets on your bed the night of your first shower and do not  sleep with pets. Day of Surgery : Do not apply any lotions/deodorants the morning of surgery.  Please wear clean clothes to the hospital/surgery center.  FAILURE TO FOLLOW THESE INSTRUCTIONS MAY RESULT IN THE CANCELLATION OF YOUR SURGERY PATIENT SIGNATURE_________________________________  NURSE SIGNATURE__________________________________  ________________________________________________________________________

## 2016-01-11 ENCOUNTER — Other Ambulatory Visit: Payer: Self-pay | Admitting: Physician Assistant

## 2016-01-12 ENCOUNTER — Encounter (HOSPITAL_COMMUNITY)
Admission: RE | Disposition: A | Discharge status: Home / Self Care | Payer: Self-pay | Source: Ambulatory Visit | Attending: Urology

## 2016-01-12 ENCOUNTER — Encounter (HOSPITAL_COMMUNITY): Payer: Self-pay | Admitting: *Deleted

## 2016-01-12 ENCOUNTER — Observation Stay (HOSPITAL_COMMUNITY)
Admission: RE | Admit: 2016-01-12 | Discharge: 2016-01-14 | Disposition: A | Discharge status: Home / Self Care | Payer: BLUE CROSS/BLUE SHIELD | Source: Ambulatory Visit | Attending: Urology | Admitting: Urology

## 2016-01-12 ENCOUNTER — Encounter (HOSPITAL_COMMUNITY): Payer: Self-pay

## 2016-01-12 ENCOUNTER — Ambulatory Visit (HOSPITAL_COMMUNITY): Payer: BLUE CROSS/BLUE SHIELD | Admitting: Anesthesiology

## 2016-01-12 ENCOUNTER — Ambulatory Visit (HOSPITAL_COMMUNITY)
Admission: RE | Admit: 2016-01-12 | Discharge: 2016-01-12 | Disposition: A | Discharge status: Home / Self Care | Payer: BLUE CROSS/BLUE SHIELD | Source: Ambulatory Visit | Attending: Urology | Admitting: Urology

## 2016-01-12 ENCOUNTER — Ambulatory Visit (HOSPITAL_COMMUNITY): Payer: BLUE CROSS/BLUE SHIELD

## 2016-01-12 DIAGNOSIS — I739 Peripheral vascular disease, unspecified: Secondary | ICD-10-CM | POA: Insufficient documentation

## 2016-01-12 DIAGNOSIS — N209 Urinary calculus, unspecified: Secondary | ICD-10-CM

## 2016-01-12 DIAGNOSIS — R0602 Shortness of breath: Secondary | ICD-10-CM

## 2016-01-12 DIAGNOSIS — J449 Chronic obstructive pulmonary disease, unspecified: Secondary | ICD-10-CM | POA: Insufficient documentation

## 2016-01-12 DIAGNOSIS — N200 Calculus of kidney: Principal | ICD-10-CM

## 2016-01-12 DIAGNOSIS — G4730 Sleep apnea, unspecified: Secondary | ICD-10-CM | POA: Insufficient documentation

## 2016-01-12 DIAGNOSIS — Z87891 Personal history of nicotine dependence: Secondary | ICD-10-CM | POA: Insufficient documentation

## 2016-01-12 HISTORY — PX: IR GENERIC HISTORICAL: IMG1180011

## 2016-01-12 HISTORY — PX: NEPHROLITHOTOMY: SHX5134

## 2016-01-12 LAB — PROTIME-INR
INR: 0.93
PROTHROMBIN TIME: 12.5 s (ref 11.4–15.2)

## 2016-01-12 LAB — CBC
HCT: 43.4 % (ref 36.0–46.0)
Hemoglobin: 14.9 g/dL (ref 12.0–15.0)
MCH: 32.7 pg (ref 26.0–34.0)
MCHC: 34.3 g/dL (ref 30.0–36.0)
MCV: 95.2 fL (ref 78.0–100.0)
PLATELETS: 300 10*3/uL (ref 150–400)
RBC: 4.56 MIL/uL (ref 3.87–5.11)
RDW: 13.6 % (ref 11.5–15.5)
WBC: 8.4 10*3/uL (ref 4.0–10.5)

## 2016-01-12 LAB — APTT: APTT: 28 s (ref 24–36)

## 2016-01-12 SURGERY — NEPHROLITHOTOMY PERCUTANEOUS
Anesthesia: General | Site: Back | Laterality: Right

## 2016-01-12 MED ORDER — 0.9 % SODIUM CHLORIDE (POUR BTL) OPTIME
TOPICAL | Status: DC | PRN
  Administered 2016-01-12: 1000 mL

## 2016-01-12 MED ORDER — CEFAZOLIN SODIUM-DEXTROSE 2-4 GM/100ML-% IV SOLN
2.0000 g | INTRAVENOUS | Status: AC
  Administered 2016-01-12: 2 g via INTRAVENOUS

## 2016-01-12 MED ORDER — DEXAMETHASONE SODIUM PHOSPHATE 10 MG/ML IJ SOLN
INTRAMUSCULAR | Status: AC
  Filled 2016-01-12: qty 1

## 2016-01-12 MED ORDER — ROCURONIUM BROMIDE 100 MG/10ML IV SOLN
INTRAVENOUS | Status: AC
  Filled 2016-01-12: qty 1

## 2016-01-12 MED ORDER — IOPAMIDOL (ISOVUE-300) INJECTION 61%
INTRAVENOUS | Status: DC | PRN
  Administered 2016-01-12: 25 mL

## 2016-01-12 MED ORDER — PROPOFOL 10 MG/ML IV BOLUS
INTRAVENOUS | Status: AC
  Filled 2016-01-12: qty 20

## 2016-01-12 MED ORDER — LIDOCAINE HCL (CARDIAC) 20 MG/ML IV SOLN
INTRAVENOUS | Status: AC
  Filled 2016-01-12: qty 5

## 2016-01-12 MED ORDER — ONDANSETRON HCL 4 MG/2ML IJ SOLN
4.0000 mg | INTRAMUSCULAR | Status: DC | PRN
  Administered 2016-01-13 (×2): 4 mg via INTRAVENOUS
  Filled 2016-01-12 (×2): qty 2

## 2016-01-12 MED ORDER — FENTANYL CITRATE (PF) 100 MCG/2ML IJ SOLN
INTRAMUSCULAR | Status: AC
  Filled 2016-01-12: qty 2

## 2016-01-12 MED ORDER — ALBUTEROL SULFATE (2.5 MG/3ML) 0.083% IN NEBU: 3.0000 mL | INHALATION_SOLUTION | Freq: Four times a day (QID) | RESPIRATORY_TRACT | Status: DC | PRN

## 2016-01-12 MED ORDER — PROPOFOL 10 MG/ML IV BOLUS
INTRAVENOUS | Status: DC | PRN
  Administered 2016-01-12: 140 mg via INTRAVENOUS

## 2016-01-12 MED ORDER — FENTANYL CITRATE (PF) 250 MCG/5ML IJ SOLN
INTRAMUSCULAR | Status: AC
  Filled 2016-01-12: qty 5

## 2016-01-12 MED ORDER — DEXAMETHASONE SODIUM PHOSPHATE 10 MG/ML IJ SOLN
INTRAMUSCULAR | Status: DC | PRN
  Administered 2016-01-12: 10 mg via INTRAVENOUS

## 2016-01-12 MED ORDER — DARIFENACIN HYDROBROMIDE ER 7.5 MG PO TB24
7.5000 mg | ORAL_TABLET | Freq: Every day | ORAL | Status: DC
  Administered 2016-01-12 – 2016-01-14 (×3): 7.5 mg via ORAL
  Filled 2016-01-12 (×3): qty 1

## 2016-01-12 MED ORDER — SUGAMMADEX SODIUM 200 MG/2ML IV SOLN
INTRAVENOUS | Status: DC | PRN
  Administered 2016-01-12: 200 mg via INTRAVENOUS

## 2016-01-12 MED ORDER — HYDROMORPHONE HCL 1 MG/ML IJ SOLN
0.5000 mg | INTRAMUSCULAR | Status: DC | PRN
  Administered 2016-01-12 – 2016-01-13 (×5): 1 mg via INTRAVENOUS
  Filled 2016-01-12 (×5): qty 1

## 2016-01-12 MED ORDER — DOCUSATE SODIUM 100 MG PO CAPS
100.0000 mg | ORAL_CAPSULE | Freq: Two times a day (BID) | ORAL | Status: DC
  Administered 2016-01-12 – 2016-01-14 (×4): 100 mg via ORAL
  Filled 2016-01-12 (×4): qty 1

## 2016-01-12 MED ORDER — ONDANSETRON HCL 4 MG/2ML IJ SOLN
INTRAMUSCULAR | Status: DC | PRN
  Administered 2016-01-12: 4 mg via INTRAVENOUS

## 2016-01-12 MED ORDER — SUGAMMADEX SODIUM 200 MG/2ML IV SOLN
INTRAVENOUS | Status: AC
  Filled 2016-01-12: qty 2

## 2016-01-12 MED ORDER — SODIUM CHLORIDE 0.45 % IV SOLN
INTRAVENOUS | Status: DC
  Administered 2016-01-12 – 2016-01-14 (×3): via INTRAVENOUS

## 2016-01-12 MED ORDER — NALOXONE HCL 0.4 MG/ML IJ SOLN
INTRAMUSCULAR | Status: AC
  Filled 2016-01-12: qty 1

## 2016-01-12 MED ORDER — SODIUM CHLORIDE 0.9 % IV SOLN
INTRAVENOUS | Status: DC
  Administered 2016-01-12: 08:00:00 via INTRAVENOUS

## 2016-01-12 MED ORDER — FENTANYL CITRATE (PF) 100 MCG/2ML IJ SOLN
INTRAMUSCULAR | Status: AC
Start: 2016-01-12 — End: 2016-01-12
  Filled 2016-01-12: qty 2

## 2016-01-12 MED ORDER — CIPROFLOXACIN IN D5W 400 MG/200ML IV SOLN
INTRAVENOUS | Status: AC
  Filled 2016-01-12: qty 200

## 2016-01-12 MED ORDER — FLUTICASONE FUROATE-VILANTEROL 100-25 MCG/INH IN AEPB
1.0000 | INHALATION_SPRAY | Freq: Every day | RESPIRATORY_TRACT | Status: DC
  Administered 2016-01-13: 1 via RESPIRATORY_TRACT
  Filled 2016-01-12: qty 28

## 2016-01-12 MED ORDER — SULFAMETHOXAZOLE-TRIMETHOPRIM 400-80 MG PO TABS
1.0000 | ORAL_TABLET | Freq: Two times a day (BID) | ORAL | Status: DC
  Administered 2016-01-12 – 2016-01-14 (×4): 1 via ORAL
  Filled 2016-01-12 (×4): qty 1

## 2016-01-12 MED ORDER — SODIUM CHLORIDE 0.9 % IR SOLN
Status: DC | PRN
  Administered 2016-01-12: 12000 mL

## 2016-01-12 MED ORDER — FENTANYL CITRATE (PF) 100 MCG/2ML IJ SOLN
INTRAMUSCULAR | Status: DC | PRN
  Administered 2016-01-12: 100 ug via INTRAVENOUS
  Administered 2016-01-12: 50 ug via INTRAVENOUS
  Administered 2016-01-12: 100 ug via INTRAVENOUS
  Administered 2016-01-12 (×2): 50 ug via INTRAVENOUS

## 2016-01-12 MED ORDER — MIDAZOLAM HCL 5 MG/5ML IJ SOLN
INTRAMUSCULAR | Status: DC | PRN
  Administered 2016-01-12: 2 mg via INTRAVENOUS

## 2016-01-12 MED ORDER — CIPROFLOXACIN IN D5W 400 MG/200ML IV SOLN: 400.0000 mg | Freq: Once | INTRAVENOUS | Status: DC

## 2016-01-12 MED ORDER — OXYCODONE-ACETAMINOPHEN 5-325 MG PO TABS
1.0000 | ORAL_TABLET | ORAL | Status: DC | PRN
  Administered 2016-01-12 – 2016-01-13 (×2): 1 via ORAL
  Filled 2016-01-12 (×2): qty 1

## 2016-01-12 MED ORDER — BELLADONNA ALKALOIDS-OPIUM 16.2-60 MG RE SUPP
1.0000 | Freq: Three times a day (TID) | RECTAL | Status: DC | PRN
  Administered 2016-01-12: 1 via RECTAL
  Filled 2016-01-12: qty 1

## 2016-01-12 MED ORDER — LACTATED RINGERS IV SOLN
INTRAVENOUS | Status: DC | PRN
  Administered 2016-01-12 (×2): via INTRAVENOUS

## 2016-01-12 MED ORDER — MIDAZOLAM HCL 2 MG/2ML IJ SOLN
INTRAMUSCULAR | Status: AC
  Filled 2016-01-12: qty 2

## 2016-01-12 MED ORDER — LIDOCAINE HCL (CARDIAC) 20 MG/ML IV SOLN
INTRAVENOUS | Status: DC | PRN
  Administered 2016-01-12: 50 mg via INTRAVENOUS

## 2016-01-12 MED ORDER — MIDAZOLAM HCL 2 MG/2ML IJ SOLN
INTRAMUSCULAR | Status: AC | PRN
  Administered 2016-01-12 (×2): 0.5 mg via INTRAVENOUS
  Administered 2016-01-12: 1 mg via INTRAVENOUS

## 2016-01-12 MED ORDER — FENTANYL CITRATE (PF) 100 MCG/2ML IJ SOLN
25.0000 ug | INTRAMUSCULAR | Status: DC | PRN
  Administered 2016-01-12 (×2): 50 ug via INTRAVENOUS

## 2016-01-12 MED ORDER — BELLADONNA ALKALOIDS-OPIUM 16.2-60 MG RE SUPP
RECTAL | Status: AC
  Filled 2016-01-12: qty 1

## 2016-01-12 MED ORDER — FLUMAZENIL 0.5 MG/5ML IV SOLN
INTRAVENOUS | Status: AC
  Filled 2016-01-12: qty 5

## 2016-01-12 MED ORDER — IOHEXOL 300 MG/ML  SOLN
INTRAMUSCULAR | Status: DC | PRN
  Administered 2016-01-12: 50 mL

## 2016-01-12 MED ORDER — ONDANSETRON HCL 4 MG/2ML IJ SOLN
INTRAMUSCULAR | Status: AC
  Filled 2016-01-12: qty 2

## 2016-01-12 MED ORDER — ZOLPIDEM TARTRATE 5 MG PO TABS
5.0000 mg | ORAL_TABLET | Freq: Every evening | ORAL | Status: DC | PRN
Start: 2016-01-12 — End: 2016-01-14

## 2016-01-12 MED ORDER — ACETAMINOPHEN 325 MG PO TABS: 650.0000 mg | ORAL_TABLET | ORAL | Status: DC | PRN

## 2016-01-12 MED ORDER — CIPROFLOXACIN IN D5W 400 MG/200ML IV SOLN
400.0000 mg | Freq: Once | INTRAVENOUS | Status: AC
  Administered 2016-01-12: 400 mg via INTRAVENOUS

## 2016-01-12 MED ORDER — ROCURONIUM BROMIDE 100 MG/10ML IV SOLN
INTRAVENOUS | Status: DC | PRN
  Administered 2016-01-12: 50 mg via INTRAVENOUS

## 2016-01-12 MED ORDER — HYDROCHLOROTHIAZIDE 25 MG PO TABS: 25.0000 mg | ORAL_TABLET | Freq: Every day | ORAL | Status: DC | PRN

## 2016-01-12 MED ORDER — CEFAZOLIN SODIUM-DEXTROSE 2-4 GM/100ML-% IV SOLN
INTRAVENOUS | Status: AC
  Filled 2016-01-12: qty 100

## 2016-01-12 MED ORDER — HYDROCHLOROTHIAZIDE 25 MG PO TABS: 25.0000 mg | ORAL_TABLET | Freq: Every day | ORAL | Status: DC

## 2016-01-12 MED ORDER — FENTANYL CITRATE (PF) 100 MCG/2ML IJ SOLN
INTRAMUSCULAR | Status: AC | PRN
  Administered 2016-01-12 (×2): 25 ug via INTRAVENOUS
  Administered 2016-01-12: 50 ug via INTRAVENOUS

## 2016-01-12 MED ORDER — LIDOCAINE HCL 1 % IJ SOLN
INTRAMUSCULAR | Status: AC
  Filled 2016-01-12: qty 20

## 2016-01-12 MED ORDER — LACTATED RINGERS IV SOLN: INTRAVENOUS | Status: DC

## 2016-01-12 SURGICAL SUPPLY — 49 items
BAG URINE DRAINAGE (UROLOGICAL SUPPLIES) ×3 IMPLANT
BASKET ZERO TIP NITINOL 2.4FR (BASKET) IMPLANT
BENZOIN TINCTURE PRP APPL 2/3 (GAUZE/BANDAGES/DRESSINGS) ×6 IMPLANT
BLADE SURG 15 STRL LF DISP TIS (BLADE) ×2 IMPLANT
BLADE SURG 15 STRL SS (BLADE) ×1
CARTRIDGE STONEBREAK CO2 KIDNE (ELECTROSURGICAL) IMPLANT
CATH FOLEY 2W COUNCIL 20FR 5CC (CATHETERS) IMPLANT
CATH ROBINSON RED A/P 20FR (CATHETERS) IMPLANT
CATH X-FORCE N30 NEPHROSTOMY (TUBING) ×3 IMPLANT
COVER SURGICAL LIGHT HANDLE (MISCELLANEOUS) ×3 IMPLANT
DRAPE C-ARM 42X120 X-RAY (DRAPES) ×3 IMPLANT
DRAPE LINGEMAN PERC (DRAPES) ×3 IMPLANT
DRAPE SURG IRRIG POUCH 19X23 (DRAPES) ×3 IMPLANT
DRSG PAD ABDOMINAL 8X10 ST (GAUZE/BANDAGES/DRESSINGS) ×6 IMPLANT
DRSG TEGADERM 8X12 (GAUZE/BANDAGES/DRESSINGS) ×6 IMPLANT
EXTRACTOR STONE NITINOL NGAGE (UROLOGICAL SUPPLIES) ×3 IMPLANT
FIBER LASER FLEXIVA 1000 (UROLOGICAL SUPPLIES) IMPLANT
FIBER LASER FLEXIVA 365 (UROLOGICAL SUPPLIES) IMPLANT
FIBER LASER FLEXIVA 550 (UROLOGICAL SUPPLIES) IMPLANT
FIBER LASER TRAC TIP (UROLOGICAL SUPPLIES) IMPLANT
FORCEPS BASKET TRICEP GRSP 2.4 (BASKET) ×3 IMPLANT
GAUZE SPONGE 4X4 12PLY STRL (GAUZE/BANDAGES/DRESSINGS) ×3 IMPLANT
GLOVE BIOGEL M 8.0 STRL (GLOVE) ×3 IMPLANT
GOWN STRL REUS W/TWL XL LVL3 (GOWN DISPOSABLE) ×3 IMPLANT
GUIDEWIRE ANG ZIPWIRE 035X150 (WIRE) ×3 IMPLANT
KIT BASIN OR (CUSTOM PROCEDURE TRAY) ×3 IMPLANT
MANIFOLD NEPTUNE II (INSTRUMENTS) ×3 IMPLANT
NS IRRIG 1000ML POUR BTL (IV SOLUTION) ×3 IMPLANT
PACK BASIC VI WITH GOWN DISP (CUSTOM PROCEDURE TRAY) ×3 IMPLANT
PACK CYSTO (CUSTOM PROCEDURE TRAY) ×3 IMPLANT
PROBE KIDNEY STONEBRKR 2.0X425 (ELECTROSURGICAL) ×3 IMPLANT
PROBE LITHOCLAST ULTRA 3.8X403 (UROLOGICAL SUPPLIES) ×3 IMPLANT
PROBE PNEUMATIC 1.0MMX570MM (UROLOGICAL SUPPLIES) IMPLANT
SET IRRIG Y TYPE TUR BLADDER L (SET/KITS/TRAYS/PACK) ×3 IMPLANT
SHEATH PEELAWAY SET 9 (SHEATH) ×3 IMPLANT
SPONGE LAP 4X18 X RAY DECT (DISPOSABLE) ×3 IMPLANT
STENT URET 6FRX24 CONTOUR (STENTS) ×3 IMPLANT
STONE CATCHER W/TUBE ADAPTER (UROLOGICAL SUPPLIES) ×3 IMPLANT
SURGIFLO W/THROMBIN 8M KIT (HEMOSTASIS) ×6 IMPLANT
SUT SILK 2 0 30  PSL (SUTURE) ×1
SUT SILK 2 0 30 PSL (SUTURE) ×2 IMPLANT
SYR 20CC LL (SYRINGE) ×6 IMPLANT
SYRINGE 10CC LL (SYRINGE) ×3 IMPLANT
TAPE CLOTH SURG 4X10 WHT LF (GAUZE/BANDAGES/DRESSINGS) ×3 IMPLANT
TRAY FOLEY BAG SILVER LF 14FR (CATHETERS) ×3 IMPLANT
TRAY FOLEY W/METER SILVER 14FR (SET/KITS/TRAYS/PACK) IMPLANT
TRAY FOLEY W/METER SILVER 16FR (SET/KITS/TRAYS/PACK) ×6 IMPLANT
TUBING CONNECTING 10 (TUBING) ×9 IMPLANT
WATER STERILE IRR 1500ML POUR (IV SOLUTION) ×3 IMPLANT

## 2016-01-12 NOTE — Procedures (Signed)
Interventional Radiology Procedure Note  Procedure: Placement of a right PCN access for nephrolithotomy access.   Percutaneous 24F catheter, standard .035 wire platform, with the catheter in right interpolar calyx between rib 11 and 12.  Tip of catheter terminates in the urinary bladder.    During the case, the indwelling ureteral stent was observed to move cranial and caudal with manipulation of the new catheter.     Complications: None Recommendations:  - NPO, with OR pending - catheter is capped.  - Routine wound care   Signed,  Dulcy Fanny. Earleen Newport, DO

## 2016-01-12 NOTE — Anesthesia Preprocedure Evaluation (Addendum)
Anesthesia Evaluation  Patient identified by MRN, date of birth, ID band Patient awake    Reviewed: Allergy & Precautions, NPO status , Patient's Chart, lab work & pertinent test results  History of Anesthesia Complications Negative for: history of anesthetic complications  Airway Mallampati: II  TM Distance: >3 FB Neck ROM: Full    Dental  (+) Teeth Intact   Pulmonary shortness of breath, asthma , sleep apnea , COPD, former smoker,    breath sounds clear to auscultation       Cardiovascular (-) hypertension(-) angina+ Peripheral Vascular Disease  (-) Past MI  Rhythm:Regular     Neuro/Psych negative neurological ROS     GI/Hepatic negative GI ROS, Neg liver ROS,   Endo/Other  negative endocrine ROS  Renal/GU Renal disease     Musculoskeletal negative musculoskeletal ROS (+)   Abdominal   Peds  Hematology negative hematology ROS (+)   Anesthesia Other Findings   Reproductive/Obstetrics                             Anesthesia Physical Anesthesia Plan  ASA: II  Anesthesia Plan: General   Post-op Pain Management:    Induction: Intravenous  Airway Management Planned: Oral ETT  Additional Equipment: None  Intra-op Plan:   Post-operative Plan: Extubation in OR  Informed Consent: I have reviewed the patients History and Physical, chart, labs and discussed the procedure including the risks, benefits and alternatives for the proposed anesthesia with the patient or authorized representative who has indicated his/her understanding and acceptance.   Dental advisory given  Plan Discussed with: CRNA and Surgeon  Anesthesia Plan Comments:         Anesthesia Quick Evaluation

## 2016-01-12 NOTE — H&P (Signed)
Chief Complaint: right renal calculi  Referring Physician:Dr. Franchot Gallo  Supervising Physician: Corrie Mckusick  Patient Status:  Out-pt  HPI: Marissa Willis is an 60 y.o. female who has a history of a right renal calculi.  She has had this for over a year, but now it is causing a right UPJ obstruction.  She is followed by Dr. Diona Fanti and plan for placement of PCN so that then they could go to OR for stone extraction.  She did have a stent placement in May and has been having some pain.  She occasional has some blood in her urine.  Otherwise, she has no new complaints.  She presents today for this procedure.  Past Medical History:  Past Medical History:  Diagnosis Date  . Asthma   . 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 COPD (chronic obstructive pulmonary disease) ([place])    pt denies  . Kidney stone   . Shortness of breath dyspnea    with asthma, on exertion, laughing  . Sleep apnea     Past Surgical History:  Past Surgical History:  Procedure Laterality Date  . coiling brain an    . CYSTOSCOPY WITH URETEROSCOPY AND STENT PLACEMENT Right 12/09/2015   Procedure: CYSTOSCOPY WITH retrograde AND STENT PLACEMENT;  Surgeon: Franchot Gallo, MD;  Location: [facility];  Service: Urology;  Laterality: Right;  . INCONTINENCE SURGERY  06/18/89  . 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 TUBAL LIGATION      Family History:  Family History  Problem Relation Age of Onset  . Diabetes Mother   . Heart disease Mother   . Hypertension Mother   . Heart disease Father     Social History:  reports that she quit smoking about 2 years ago. Her smoking use included Cigarettes. She started smoking about 2 years ago. She has a 67.50 pack-year smoking history. She has never used smokeless tobacco. She reports that she drinks alcohol. She reports that she uses drugs, including Marijuana.  Allergies: No Known Allergies  Medications:   Medication List      Notice   This visit is during an admission. Changes to the med list made in this visit will be reflected in the After Visit Summary of the admission.     Please HPI for pertinent positives, otherwise complete 10 system ROS negative.  Mallampati Score: MD Evaluation Airway: WNL Heart: WNL Abdomen: WNL Chest/ Lungs: WNL ASA  Classification: 2 Mallampati/Airway Score: Two  Physical Exam: BP 132/69 (BP Location: Left Arm)   Pulse (!) 52   Temp 98.5 F (36.9 C) (Oral)   Resp 16   Wt 166 lb 3.2 oz (75.4 kg)   SpO2 98%   BMI 28.53 kg/m  Body mass index is 28.53 kg/m. General: pleasant, WD, WN white female who is laying in bed in NAD HEENT: head is normocephalic, atraumatic.  Sclera are noninjected.  PERRL.  Ears and nose without any masses or lesions.  Mouth is pink and moist Heart: regular, rate, and rhythm.  Normal s1,s2. No obvious murmurs, gallops, or rubs noted.  Palpable radial and pedal pulses bilaterally Lungs: CTAB, no wheezes, rhonchi, or rales noted.  Respiratory effort nonlabored Abd: soft, NT, ND, +BS, no masses, hernias, or organomegaly MS: all 4 extremities are symmetrical with no cyanosis, clubbing, or edema. Psych: A&Ox3 with an appropriate affect.   Labs: Results for orders placed or performed during the hospital encounter of 01/10/16 (from the  past 48 hour(s))  ABO/Rh     Status: None   Collection Time: 01/10/16 12:00 PM  Result Value Ref Range   ABO/RH(D) O POS   CBC     Status: None   Collection Time: 01/10/16 12:20 PM  Result Value Ref Range   WBC 9.0 4.0 - 10.5 K/uL   RBC 4.51 3.87 - 5.11 MIL/uL   Hemoglobin 14.7 12.0 - 15.0 g/dL   HCT 43.3 36.0 - 46.0 %   MCV 96.0 78.0 - 100.0 fL   MCH 32.6 26.0 - 34.0 pg   MCHC 33.9 30.0 - 36.0 g/dL   RDW 13.5 11.5 - 15.5 %   Platelets 287 150 - 400 K/uL  Basic metabolic panel     Status: None   Collection Time: 01/10/16 12:20 PM  Result Value Ref Range   Sodium 139 135 - 145 mmol/L   Potassium 3.9 3.5 -  5.1 mmol/L   Chloride 110 101 - 111 mmol/L   CO2 24 22 - 32 mmol/L   Glucose, Bld 87 65 - 99 mg/dL   BUN 12 6 - 20 mg/dL   Creatinine, Ser 0.72 0.44 - 1.00 mg/dL   Calcium 9.2 8.9 - 10.3 mg/dL   GFR calc non Af Amer >60 >60 mL/min   GFR calc Af Amer >60 >60 mL/min    Comment: (NOTE) The eGFR has been calculated using the CKD EPI equation. This calculation has not been validated in all clinical situations. eGFR's persistently <60 mL/min signify possible Chronic Kidney Disease.    Anion gap 5 5 - 15    Imaging: No results found.  Assessment/Plan 1. 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 Diona Fanti to do his part -labs and vitals have been reviewed -Risks and Benefits discussed with the patient including, but not limited to infection, bleeding, significant bleeding causing loss or decrease in renal function or damage to adjacent structures.  All of the patient's questions were answered, patient is agreeable to proceed. Consent signed and in chart.   Thank you for this interesting consult.  I greatly enjoyed meeting Marissa Willis and look forward to participating in their care.  A copy of this report was sent to the requesting provider on this date.  Electronically Signed: Henreitta Cea 01/12/2016, 10:21 AM   I spent a total of  30 Minutes   in face to face in clinical consultation, greater than 50% of which was counseling/coordinating care for nephrolithiasis of right kidney

## 2016-01-12 NOTE — H&P (Signed)
  H&P  Chief Complaint: Right-sided kidney stones  History of Present Illness: Marissa Willis is a 60 y.o. year old female who presents at this time for percutaneous nephrolithotomy as primary management of a large stone burden in her right kidney. She presented to our attention about a month ago with fever, right flank pain, urinary tract infection and a large right renal stone burden, with UPJ obstruction from a renal pelvic stone. She underwent urgent stenting, hospitalization and antibiotic administration. She subsequently defervesced. She has been managed with antibiotics since that time. Because of the size of her renal calculi--16 mm at the UPJ as well as lower pole calculi, she presents for percutaneous management of these.  Past Medical History:  Diagnosis Date  . Asthma   . 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 COPD (chronic obstructive pulmonary disease) ([place])    pt denies  . Kidney stone   . Shortness of breath dyspnea    with asthma, on exertion, laughing  . Sleep apnea     Past Surgical History:  Procedure Laterality Date  . coiling brain an    . CYSTOSCOPY WITH URETEROSCOPY AND STENT PLACEMENT Right 12/09/2015   Procedure: CYSTOSCOPY WITH retrograde AND STENT PLACEMENT;  Surgeon: Franchot Gallo, MD;  Location: [facility];  Service: Urology;  Laterality: Right;  . INCONTINENCE SURGERY  06/18/89  . 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 TUBAL LIGATION      Home Medications:  No prescriptions prior to admission.    Allergies: No Known Allergies  Family History  Problem Relation Age of Onset  . Diabetes Mother   . Heart disease Mother   . Hypertension Mother   . Heart disease Father     Social History:  reports that she quit smoking about 2 years ago. Her smoking use included Cigarettes. She started smoking about 2 years ago. She has a 67.50 pack-year smoking history. She has never used smokeless tobacco. She reports that she drinks  alcohol. She reports that she uses drugs, including Marijuana.  ROS: A complete review of systems was performed.  All systems are negative except for pertinent findings as noted.  Physical Exam:  Vital signs in last 24 hours:   General:  Alert and oriented, No acute distress HEENT: Normocephalic, atraumatic Neck: No JVD or lymphadenopathy Cardiovascular: Regular rate and rhythm Lungs: Clear bilaterally Abdomen: Soft, nontender, nondistended, no abdominal masses Back: No CVA tenderness Extremities: No edema Neurologic: Grossly intact  Laboratory Data:  No results found for this or any previous visit (from the past 24 hour(s)). No results found for this or any previous visit (from the past 240 hour(s)). Creatinine:  Recent Labs  01/10/16 1220  CREATININE 0.72    Radiologic Imaging: No results found.  Impression/Assessment:  Large right renal pelvic stones with initial hydronephrosis and infection, treated with double-J stent placement  Plan:  Percutaneous nephrolithotomy of right renal stone burden  [name] 01/12/2016, 6:51 AM  Lillette Boxer. Gerrell Tabet MD

## 2016-01-12 NOTE — Discharge Instructions (Signed)
DISCHARGE INSTRUCTIONS FOR PERCUTANEOUS STONE SURGERY °MEDICATIONS:  °1. DO NOT RESUME YOUR IBUPROFEN, or any other medicines like aspirin, motrin, excedrin, advil, aleve, vitamin E, fish oil as these can all cause bleeding x 10 days.  °2. Resume all your other meds from home - except do not take any other pain meds that you may have at home. ° °ACTIVITY °1. No strenuous activity, sexual activity, or lifting greater than 10 pounds for 2 weeks. °2. No driving while on narcotic pain medications °3. Drink plenty of water °4. Continue to walk at home - you can still get blood clots when you are at home, so keep active, but don't over do it. °5. May return to work in 1 week (but not heavy or strenuous activity).  ° °BATHING °You can shower.  Cover your wound with a dressing and remove the dressing immediately after the shower.  Do not submerge wound under water.  ° °WOUND CARE °Your wound will drain bloody fluid and may do so for 7-14 days. You have 2 options for dressings:  °1. You may use gauze and tape to dress your wound.  If you choose this method, then change the dressing as it becomes soaked.  Change it at least once daily until it stops draining. You may switch to a Band Aid once drainage stops. °2. If drainage is copious, you may use an ostomy device.  This is a bag with an andhesive circle.  The circle has a hole in the middle of it and you cut the hole to the size needed to fit the wound.  This will collect the drainage in the bag and allow you to drain the bag as needed.  ° °SIGNS/SYMPTOMS TO CALL: °1. Please call us if you have a fever greater than 101.5, uncontrolled nausea/vomiting, uncontrolled pain, dizziness, unable to urinate, bloody urine, chest pain, shortness of breath, leg swelling, leg pain, redness around wound, drainage from wound, or any other concerns or questions. °2. You can reach us at [phone]. °FOLLOW-UP ° ° °

## 2016-01-12 NOTE — Anesthesia Procedure Notes (Signed)
Procedure Name: Intubation Date/Time: 01/12/2016 1:45 PM Performed by: Glory Buff Pre-anesthesia Checklist: Patient identified, Emergency Drugs available, Suction available and Patient being monitored Patient Re-evaluated:Patient Re-evaluated prior to inductionOxygen Delivery Method: Circle system utilized Preoxygenation: Pre-oxygenation with 100% oxygen Intubation Type: IV induction Ventilation: Mask ventilation without difficulty Laryngoscope Size: Miller and 3 Grade View: Grade II Tube type: Oral Tube size: 7.0 mm Number of attempts: 1 Airway Equipment and Method: Stylet and Oral airway Placement Confirmation: ETT inserted through vocal cords under direct vision,  positive ETCO2 and breath sounds checked- equal and bilateral Secured at: 20 cm Tube secured with: Tape Dental Injury: Teeth and Oropharynx as per pre-operative assessment

## 2016-01-12 NOTE — Transfer of Care (Signed)
Immediate Anesthesia Transfer of Care Note  Patient: Marissa Willis  Procedure(s) Performed: Procedure(s): RIGHT  PERCUTANEOUS NEPHROLITHOTOMY  insertion double j stent flexible cystoscopy  removable right double j stent (Right)  Patient Location: PACU  Anesthesia Type:General  Level of Consciousness: awake, alert  and patient cooperative  Airway & Oxygen Therapy: Patient Spontanous Breathing and Patient connected to face mask oxygen  Post-op Assessment: Report given to RN and Post -op Vital signs reviewed and stable  Post vital signs: Reviewed and stable  Last Vitals: There were no vitals filed for this visit.  Last Pain: There were no vitals filed for this visit.    Patients Stated Pain Goal: 3 ([id number])  Complications: No apparent anesthesia complications

## 2016-01-12 NOTE — Op Note (Signed)
Preoperative diagnosis: Renal calculi-54m right UPJ stone, 165mright lower pole stone  Postoperative diagnosis: Same  Principal procedure: Percutaneous nephrolithotomy, placement of right double-J stent, cystoscopy, right double-J stent extraction  Surgeon: Graylyn Bunney  Anesthesia: Gen. endotracheal  Estimated blood loss: 250 mL  Specimen: Stone fragments, to the patient's family  Drains: 16 French Foley catheter, 24 cm x 6 French contour double-J stent and right kidney/ureter  Complications: None  Indications: [age]ear old female recently presented with pyonephrosis from an obstructing right UPJ stone 16 mm in size as well as a lower pole stone approximately the same size. She had urgent stent placement and antibiotic management. Because of the size of the stone, she presents at this time for percutaneous management. I've discussed the procedure percutaneous nephrolithotomy with the patient. Risks and complications were discussed. These include but are not limited to needing a second procedure, infection, bleeding, anesthetic complications, needing transfusion, extended hospitalization, among others. She understands these. She desires to proceed.  Description of procedure: The patient had percutaneous nephrostomy tube placement performed prior to the nephrolithotomy by Dr. JaDamita Dunningsf interventional radiology. This was through an upper pole calyx. She was then taken to the holding area. She was identified and properly marked. She had received preoperative IV antibiotics. She was then taken to the operating room. She received general endotracheal anesthetic. She was then placed in the supine position, Foley catheter was placed. She was placed prone, with all pressure points padded appropriately. Her right flank was prepped and draped. Proper timeout was then performed.  Through the 4 French ureteral catheter, I passed as a wire under fluoroscopic guidance throug this catheter into the  bladder. Once adequate positioning was seen, the ureteral catheter was removed. I then passed, following an incision just lateral to the guidewire, the 8/10 French peel-away sheath. This was positioned fluoroscopically. The core was then removed, and a second superstiff guidewire was then passed through the sheath, using fluoroscopic guidance it was passed into the bladder were curl was seen. The peel-away sheath was removed. I then passed the 28th French NephroMax balloon over top of the guidewire, fluoroscopically right to the renal pelvic stone which was easily visible. The balloon was inflated to 20 atm of pressure for approximately 2 minutes prior to passing the 28 French access sheath over top of the balloon. The balloon was deflated and removed over top of the guidewire.  The renal pelvic stone was imaged. It was quite friable, and part of this, I think was matrix. This was aspirated with the ultrasonic lithotrite. No further stone material was seen in the pelvis. It was evident that there was a larger stone in the lower pole calyceal system. This was imaged first with the flexible scope, then with the rigid nephroscope. Again, the ultrasonic lithotrite was used to aspirate the stone. Larger fragments were easily removed with the graspers. I then inspected the entire I will calyceal system both with the rigid nephroscope as well as the flexible nephroscope. No stone fragments were seen. The patient had had an indwelling double-J stent. This had been pushed into the ureter during passage of the guidewires. I tried to snare the double-J stent and bring it back up into the renal pelvis. Unfortunately, I could not get a good grasp on this. It was then pushed distally into the bladder where it will be retrieved later. Following inspection of the pyelocalyceal system, and seeing no further stones, I then passed another double-J stent-24 cm x 6 French contour stent, with  the string removed, using the nephroscope  and fluoroscopic guidance. This was guided into the bladder easily. Once adequately positioned, the guidewire was removed. As there was no significant bleeding during the procedure, and there was no further evidence of stone matter within the kidney, I felt it would be fine to make this a tubeless nephrolithotomy. I then removed the access sheath. I used FloSeal in the trocar, and this was passed through the nephrostomy tract to the edge of the renal parenchyma. FloSeal was then deployed within the nephrostomy tract. There was adequate hemostasis. Pressure was placed for approximately 2 minutes. The flank incision was then closed with 2-0 silk placed in a vertical mattress fashion. Dry sterile dressing was then placed. The patient was then placed in the supine position. Genitalia were prepped, as well as draped. The Foley catheter had been removed. I then passed a flexible cystoscope. The bladder was inspected. The double-J stent that it then passed antegrade into the bladder was then snared and extracted. A Foley catheter was then replaced.  The procedure was then terminated. The patient was extubated and taken to the PACU in stable condition. She tolerated procedure well. She remained hemodynamically stable.

## 2016-01-13 ENCOUNTER — Observation Stay (HOSPITAL_COMMUNITY): Payer: BLUE CROSS/BLUE SHIELD

## 2016-01-13 DIAGNOSIS — N200 Calculus of kidney: Secondary | ICD-10-CM | POA: Diagnosis not present

## 2016-01-13 LAB — HEMOGLOBIN AND HEMATOCRIT, BLOOD
HEMATOCRIT: 43.5 % (ref 36.0–46.0)
Hemoglobin: 14.4 g/dL (ref 12.0–15.0)

## 2016-01-13 LAB — TYPE AND SCREEN
ABO/RH(D): O POS
Antibody Screen: NEGATIVE

## 2016-01-13 MED ORDER — OXYCODONE-ACETAMINOPHEN 5-325 MG PO TABS: 1.0000 | ORAL_TABLET | ORAL | Status: DC | PRN

## 2016-01-13 MED ORDER — ACETAMINOPHEN 325 MG PO TABS: 650.0000 mg | ORAL_TABLET | ORAL | Status: DC | PRN

## 2016-01-13 MED ORDER — ACETAMINOPHEN 10 MG/ML IV SOLN
1000.0000 mg | Freq: Four times a day (QID) | INTRAVENOUS | Status: AC
  Administered 2016-01-13 – 2016-01-14 (×4): 1000 mg via INTRAVENOUS
  Filled 2016-01-13 (×6): qty 100

## 2016-01-13 NOTE — Care Management Note (Signed)
Case Management Note  Patient Details  Name: Marissa Willis MRN: [id number] Date of Birth: [birth year]  Subjective/Objective:  60 y/o f admitted w/kidney calculi.POD#1 R PCN. From home.Noted 02 sats 88% ra. Will monitor- recommend ambulatory sats checked since may need home 02.Nsg notified.                  Action/Plan:d/c plan home.   Expected Discharge Date:                 Expected Discharge Plan:  Home/Self Care  In-House Referral:     Discharge planning Services  CM Consult  Post Acute Care Choice:    Choice offered to:     DME Arranged:    DME Agency:     HH Arranged:    HH Agency:     Status of Service:  In process, will continue to follow  If discussed at Long Length of Stay Meetings, dates discussed:    Additional Comments:  Dessa Phi, RN 01/13/2016, 3:18 PM

## 2016-01-13 NOTE — Progress Notes (Signed)
MD via phone update on Pt's status. Pt up to bathroom and Pt with c/o shortness of breath. Pt's resp pattern more labored and on room air O2 sat 88%. Placed on O2 2l [place] and O2 sats  Up to 93%. VSS are stable. Lungs diminished but clear. New orders and maintain current plan of care. Pt given update and reaasurance

## 2016-01-13 NOTE — Progress Notes (Signed)
1 Day Post-Op Subjective: Patient reports nausea, pain with inspiration. She is unable to keep anything down at this point.  Objective: Vital signs in last 24 hours: Temp:  [97.8 F (36.6 C)-98.9 F (37.2 C)] 98.9 F (37.2 C) (07/28 0527) Pulse Rate:  [51-87] 85 (07/28 0527) Resp:  [13-21] 20 (07/28 0527) BP: (111-176)/(66-90) 155/78 (07/28 0527) SpO2:  [93 %-100 %] 93 % (07/28 0527) Weight:  [75.7 kg (166 lb 13.2 oz)-75.7 kg (166 lb 14.2 oz)] 75.7 kg (166 lb 14.2 oz) (07/27 1820)  Intake/Output from previous day: 07/27 0701 - 07/28 0700 In: 2703.8 [I.V.:2703.8] Out: 1000 [Urine:800; Blood:200] Intake/Output this shift: No intake/output data recorded.  Physical Exam:  Constitutional: Vital signs reviewed. WD WN in NAD   Eyes: PERRL, No scleral icterus.   Cardiovascular: RRR Pulmonary/Chest: Decreased inspiratory effort. Abdominal: Soft. Tender in right upper and lower quadrant. Her dressing is dry.: Extremities: No cyanosis or edema   Lab Results:  Recent Labs  01/10/16 1220 01/12/16 0800 01/13/16 0529  HGB 14.7 14.9 14.4  HCT 43.3 43.4 43.5   BMET  Recent Labs  01/10/16 1220  NA 139  K 3.9  CL 110  CO2 24  GLUCOSE 87  BUN 12  CREATININE 0.72  CALCIUM 9.2    Recent Labs  01/12/16 0800  INR 0.93   No results for input(s): LABURIN in the last 72 hours. Results for orders placed or performed during the hospital encounter of 12/09/15  Urine culture     Status: Abnormal   Collection Time: 12/09/15  8:25 AM  Result Value Ref Range Status   Specimen Description URINE, CLEAN CATCH  Final   Special Requests NONE  Final   Culture >=100,000 COLONIES/mL ESCHERICHIA COLI (A)  Final   Report Status 12/11/2015 FINAL  Final   Organism ID, Bacteria ESCHERICHIA COLI (A)  Final      Susceptibility   Escherichia coli - MIC*    AMPICILLIN <=2 SENSITIVE Sensitive     CEFAZOLIN <=4 SENSITIVE Sensitive     CEFTRIAXONE <=1 SENSITIVE Sensitive     CIPROFLOXACIN  <=0.25 SENSITIVE Sensitive     GENTAMICIN <=1 SENSITIVE Sensitive     IMIPENEM <=0.25 SENSITIVE Sensitive     NITROFURANTOIN <=16 SENSITIVE Sensitive     TRIMETH/SULFA <=20 SENSITIVE Sensitive     AMPICILLIN/SULBACTAM <=2 SENSITIVE Sensitive     PIP/TAZO <=4 SENSITIVE Sensitive     * >=100,000 COLONIES/mL ESCHERICHIA COLI    Studies/Results: Dg Abd 1 View  Result Date: 01/12/2016 CLINICAL DATA:  Intraoperative right-sided percutaneous nephrostomy. EXAM: ABDOMEN - 1 VIEW COMPARISON:  CT 12/09/2015 FINDINGS: Examination demonstrates evidence of patient's double-J right-sided internal ureteral stent on the initial images with percutaneous wire extending into the right ventricle widening system and ureter and proceeding to the level of the bladder. There is an oval shape moderate-size opacity over the expected region of the right renal pelvis likely representing patient's known stone. Subsequent images demonstrate percutaneous balloon catheter extending into the region of the right renal pelvis. Additional images demonstrate the presumed stone over the renal pelvis to no longer be present as double-J ureteral stent is back and in adequate position on the final image. IMPRESSION: Percutaneous right intrarenal stone manipulation. Double-J right-sided internal ureteral stent remains in place. Recommend correlation with findings at the time of the procedure. Electronically Signed   By: Marin Olp M.D.   On: 01/12/2016 16:00  Dg C-arm 1-60 Min-no Report  Result Date: 01/12/2016 CLINICAL DATA: stones C-ARM  1-60 MINUTES Fluoroscopy was utilized by the requesting physician.  No radiographic interpretation.   Ir Ureteral Stent Right New Access W/o Sep Nephrostomy Cath  Result Date: 01/12/2016 INDICATION: 60 year old female with a history of right-sided nephrolithiasis. She has had prior ureteral stent placed. She presents today for right-sided percutaneous nephrostomy access for forthcoming  nephrolithotomy EXAM: IR URETURAL STENT RIGHT NEW ACCESS W/O SEP NEPHROSTOMY CATH COMPARISON:  CT 12/09/2015 MEDICATIONS: 400 mg IV Cipro; The antibiotic was administered in an appropriate time frame prior to skin puncture. ANESTHESIA/SEDATION: Fentanyl 100 mcg IV; Versed 2.0 mg IV Moderate Sedation Time:  25 The patient was continuously monitored during the procedure by the interventional radiology nurse under my direct supervision. CONTRAST:  Twenty cc - administered into the collecting system(s) FLUOROSCOPY TIME:  Fluoroscopy Time: 3 minutes 12 seconds (68 mGy). COMPLICATIONS: None PROCEDURE: Informed written consent was obtained from the patient and the patient's family after a thorough discussion of the procedural risks, benefits and alternatives. All questions were addressed. Maximal Sterile Barrier Technique was utilized including caps, mask, sterile gowns, sterile gloves, sterile drape, hand hygiene and skin antiseptic. A timeout was performed prior to the initiation of the procedure. The patient's right flank was prepped and draped in the usual sterile fashion. Patient position prone position on the fluoroscopy table. Ultrasound images and fluoroscopic images were performed with images stored sent to PACs. Once a suitable approach to the right kidney was determined, the skin and subcutaneous tissues were generously infiltrated 1% lidocaine for local anesthesia. Using ultrasound guidance, a 21 gauge needle was advanced into the interpolar, posterior calyx between ribs 11 and 12. Under fluoroscopy, position the needle was confirmed with small amount of contrast injection. After filling the collecting system, multiple images were acquired add different projection. Small amount of gas was infused into the collecting system to confirm posterior calyx location. Given that this interpolar calyx was between ribs 11 and 12, with increasing risk of potential diaphragm injury with a higher more caudal puncture, the  current calyx was selected for percutaneous access. Using modified Seldinger technique, Accustick system was advanced into the collecting system. Once the 035 wire was advanced into the ureter, Accustick catheter was removed, and a 4 French straight tip glide cath was advanced over the wire into the urinary bladder. Images confirm position and the wire was removed. Catheter was sutured to the skin and the catheter was capped. Patient tolerated the procedure well and remained hemodynamically stable throughout. No complications were encountered and no significant blood loss encountered. IMPRESSION: Status post image guided right-sided percutaneous nephro ureteral tube placement for a forthcoming percutaneous nephrolithotomy on today's date. The interpolar posterior calyx was selected given its reasonable angulation into the lower pole collecting system, and the location between eleventh and twelfth ribs. Signed, Dulcy Fanny. Earleen Newport, DO [facility] Radiology Electronically Signed   By: Corrie Mckusick D.O.   On: 01/12/2016 13:06   Assessment/Plan:   Postoperative day #1 right percutaneous nephrolithotomy. She is having a fair amount of pain and nausea. Question whether the nausea is from her pain medicine. Her hemoglobin is stable/normal.    I will start her on IV Tylenol-hopefully this will help the nausea as well as the pain. Institute pulmonary toilet, get out of bed. She is not ready to go home today.   LOS: 0 days   Franchot Gallo M 01/13/2016, 7:55 AM

## 2016-01-13 NOTE — Progress Notes (Signed)
Pt reports feel less short of breath and on 2l [place] O2 sats 95%. Pt denies any further needs, Continue to monitor Pt closely and maintain current plan of care

## 2016-01-13 NOTE — Discharge Summary (Signed)
Date of admission: 01/12/2016  Date of discharge: 01/14/2016  Admission diagnosis: right renal calculi  Discharge diagnosis: right renal calculi  Secondary diagnoses: none  History and Physical: For full details, please see admission history and physical. Briefly, Marissa Willis is a 60 y.o. year old patient with right renal calculi.   Hospital Course: 60 yo female with  s/p right PCNL with Dr. Diona Fanti. She did well post-operatively. On POD#1 she experienced nausea, likely secondary to pain medication. On POD#2 her nausea had improved. By POD#2 her diet was slowly advanced and at the time of discharge she was tolerating a regular diet, ambulating at her baseline, was voiding spontaneously after foley catheter removal, and pain was well controlled with oral narcotics. She was discharged to home on POD#2.  Laboratory values:   Recent Labs  01/12/16 0800 01/13/16 0529  HGB 14.9 14.4  HCT 43.4 43.5   No results for input(s): CREATININE in the last 72 hours.  Disposition: Home  Discharge instruction:  Discharge Instructions    Discharge instructions    Complete by:  As directed   DISCHARGE INSTRUCTIONS FOR PCNL  If the tube in your back (nephrostomy tube) was removed during your hospitalization, it is normal to have some drainage through the incision site for several days after drain removal. It is best to remain in an upright position or lying on the opposite side in order to limit this drainage. It will slow down over the course of several days and then stop.  Please do not perform any strenuous activity or heavy lifting > 10lbs for two weeks after surgery. It is okay to shower starting 24 hours after surgery, but do not so ak in the tub or swim for two weeks. Please call if you have any fevers > 101.5, worsening pain/spreading redness/or pus draining from the incision, nausea or vomiting, or other questions or concerns.  MEDICATIONS:  1. DO NOT RESUME YOUR IBUPROFEN, or any  other medicines like aspirin, motrin, excedrin, advil, aleve, vitamin E, fish oil as these can all cause bleeding x 10 days.  2. Resume all your other meds from home - except do not take any other pain meds that you may have at home.  ACTIVITY 1. No strenuous activity, sexual activity, or lifting greater than 10 pounds for 3 weeks. 2. No driving while on narcotic pain medications 3. Drink plenty of water 4. Continue to walk at home - you can still get blood clots when you are at home, so keep active, but don't over do it. 5. May return to work in 1 week (but not heavy or strenuous activity).   BATHING 1. You can shower and we recommend daily showers.  Cover your wound with a dressing and remove the dressing immediately after the shower.  Do not submerge wound under water.   WOUND CARE Your wound will drain bloody fluid and may do so for 7-14 days. You have 2 options for dressings:  1. You may use kerlex (rolled up gauze) and tape to dress your wound.  If you choose this method, then change the dressing as it becomes soaked.  Change it at least once daily until it stops draining.  SIGNS/SYMPTOMS TO CALL: Please call us if you have a fever greater than 101.5, uncontrolled nausea/vomiting, uncontrolled pain, dizziness, unable to urinate, bloody urine, chest pain, shortness of breath, leg swelling, leg pain, redness around wound, drainage from wound, or any other concerns or questions. You can reach [place] at [phone].  Discharge medications:    Medication List    TAKE these medications   Albuterol Sulfate 108 (90 Base) MCG/ACT Aepb Commonly known as:  PROAIR RESPICLICK Inhale 2 puffs into the lungs every 6 (six) hours as needed. What changed:  reasons to take this   aspirin 81 MG tablet Take 81 mg by mouth daily.   fluticasone furoate-vilanterol 100-25 MCG/INH Aepb Commonly known as:  BREO ELLIPTA Inhale 1 puff into the lungs daily.   hydrochlorothiazide 12.5 MG  tablet Commonly known as:  HYDRODIURIL Take 2 tablets (25 mg total) by mouth daily. As needed for lower leg swelling. What changed:  when to take this  reasons to take this  additional instructions   oxyCODONE-acetaminophen 5-325 MG tablet Commonly known as:  PERCOCET/ROXICET Take 1 tablet by mouth every 4 (four) hours as needed for severe pain. What changed:  Another medication with the same name was added. Make sure you understand how and when to take each.   oxyCODONE-acetaminophen 5-325 MG tablet Commonly known as:  PERCOCET/ROXICET Take 1 tablet by mouth every 4 (four) hours as needed for severe pain. What changed:  You were already taking a medication with the same name, and this prescription was added. Make sure you understand how and when to take each.   solifenacin 10 MG tablet Commonly known as:  VESICARE Take 10 mg by mouth daily.   sulfamethoxazole-trimethoprim 400-80 MG tablet Commonly known as:  BACTRIM,SEPTRA Take 1 tablet by mouth every 12 (twelve) hours.       Followup:  Follow-up Information    DAHLSTEDT, Lillette Boxer, MD.   Specialty:  Urology Why:  We will call you Contact information: [place] [address] [phone]

## 2016-01-14 DIAGNOSIS — N200 Calculus of kidney: Secondary | ICD-10-CM | POA: Diagnosis not present

## 2016-01-14 MED ORDER — OXYCODONE-ACETAMINOPHEN 5-325 MG PO TABS: 1.0000 | ORAL_TABLET | ORAL | 0 refills | Status: DC | PRN

## 2016-01-14 NOTE — Progress Notes (Signed)
Patient ID: Marissa Willis, female   DOB: 11-29-1955, 60 y.o.   MRN: [id number]  2 Days Post-Op Subjective: Pt feeling much better this morning.  Nausea improved although she has not eaten much.  Pain controlled.  Urine still red tinged but she is voiding well.  Objective: Vital signs in last 24 hours: Temp:  [98.7 F (37.1 C)-100.4 F (38 C)] 99.4 F (37.4 C) (07/29 0351) Pulse Rate:  [89-95] 89 (07/29 0351) Resp:  [20-23] 20 (07/29 0351) BP: (132-162)/(67-69) 135/69 (07/29 0351) SpO2:  [89 %-91 %] 91 % (07/29 0351)  Intake/Output from previous day: 07/28 0701 - 07/29 0700 In: 973.8 [I.V.:873.8; IV Piggyback:100] Out: 4045 [Urine:4045] Intake/Output this shift: No intake/output data recorded.  Physical Exam:  General: Alert and oriented Abdomen: R perc site dry.  Lab Results:  Recent Labs  01/12/16 0800 01/13/16 0529  HGB 14.9 14.4  HCT 43.4 43.5    Studies/Results:  Assessment/Plan: S/P tubeless right PNCL - If she tolerates breakfast, will d/c home.   LOS: 0 days   Marissa Willis,LES 01/14/2016, 9:08 AM

## 2016-01-14 NOTE — Anesthesia Postprocedure Evaluation (Signed)
Anesthesia Post Note  Patient: Marissa Willis  Procedure(s) Performed: Procedure(s) (LRB): RIGHT  PERCUTANEOUS NEPHROLITHOTOMY  insertion double j stent flexible cystoscopy  removable right double j stent (Right)  Patient location during evaluation: PACU Anesthesia Type: General Level of consciousness: awake and alert Pain management: pain level controlled Vital Signs Assessment: post-procedure vital signs reviewed and stable Respiratory status: spontaneous breathing, nonlabored ventilation, respiratory function stable and patient connected to nasal cannula oxygen Cardiovascular status: blood pressure returned to baseline and stable Postop Assessment: no signs of nausea or vomiting Anesthetic complications: no    Last Vitals:  Vitals:   01/13/16 2144 01/14/16 0351  BP: 132/67 135/69  Pulse: 95 89  Resp: 20 20  Temp: (!) 38 C 37.4 C    Last Pain:  Vitals:   01/14/16 0433  TempSrc:   PainSc: Asleep                 [name],W. EDMOND

## 2016-01-16 MED FILL — BREO ELLIPTA 100-25 MCG INH: 100-25 | 30 days supply | Qty: 60 | Fill #6

## 2016-01-16 MED FILL — OXYCODONE/APAP 5-325: 5-325 | 3 days supply | Qty: 20 | Fill #0

## 2016-02-16 ENCOUNTER — Other Ambulatory Visit: Payer: Self-pay | Admitting: Urology

## 2016-02-16 MED FILL — BREO ELLIPTA 100-25 MCG INH: 100-25 | 30 days supply | Qty: 60 | Fill #7

## 2016-02-16 MED FILL — OXYCODONE/APAP 5-325: 5-325 | 4 days supply | Qty: 20 | Fill #0

## 2016-02-17 ENCOUNTER — Encounter (HOSPITAL_BASED_OUTPATIENT_CLINIC_OR_DEPARTMENT_OTHER): Payer: Self-pay | Admitting: *Deleted

## 2016-02-17 NOTE — Progress Notes (Signed)
NPO AFTER MN WITH EXCEPTION CLEAR LIQUIDS UNTIL 0800 (NO CREAM [place] PRODUCTS).  ARRIVE AT 1230.  NEEDS HG.  MAY TAKE OXYCODONE IF NEEDED AM DOS W/ SIPS OF WATER.

## 2016-02-23 ENCOUNTER — Ambulatory Visit (HOSPITAL_BASED_OUTPATIENT_CLINIC_OR_DEPARTMENT_OTHER)
Admission: RE | Admit: 2016-02-23 | Discharge: 2016-02-23 | Disposition: A | Discharge status: Home / Self Care | Payer: BLUE CROSS/BLUE SHIELD | Source: Ambulatory Visit | Attending: Urology | Admitting: Urology

## 2016-02-23 ENCOUNTER — Ambulatory Visit (HOSPITAL_BASED_OUTPATIENT_CLINIC_OR_DEPARTMENT_OTHER): Payer: BLUE CROSS/BLUE SHIELD | Admitting: Anesthesiology

## 2016-02-23 ENCOUNTER — Encounter (HOSPITAL_BASED_OUTPATIENT_CLINIC_OR_DEPARTMENT_OTHER)
Admission: RE | Disposition: A | Discharge status: Home / Self Care | Payer: Self-pay | Source: Ambulatory Visit | Attending: Urology

## 2016-02-23 ENCOUNTER — Encounter (HOSPITAL_BASED_OUTPATIENT_CLINIC_OR_DEPARTMENT_OTHER): Payer: Self-pay | Admitting: *Deleted

## 2016-02-23 DIAGNOSIS — T83122A Displacement of urinary stent, initial encounter: Secondary | ICD-10-CM | POA: Diagnosis not present

## 2016-02-23 DIAGNOSIS — Y838 Other surgical procedures as the cause of abnormal reaction of the patient, or of later complication, without mention of misadventure at the time of the procedure: Secondary | ICD-10-CM | POA: Insufficient documentation

## 2016-02-23 HISTORY — DX: Other specified postprocedural states: Z98.890

## 2016-02-23 HISTORY — DX: Obstructive sleep apnea (adult) (pediatric): G47.33

## 2016-02-23 HISTORY — DX: Localization-related (focal) (partial) symptomatic epilepsy and epileptic syndromes with complex partial seizures, not intractable, without status epilepticus: G40.209

## 2016-02-23 HISTORY — DX: Dependence on other enabling machines and devices: Z99.89

## 2016-02-23 HISTORY — DX: Stress incontinence (female) (male): N39.3

## 2016-02-23 HISTORY — PX: CYSTOSCOPY WITH URETEROSCOPY AND STENT PLACEMENT: SHX6377

## 2016-02-23 HISTORY — DX: Other specified chronic obstructive pulmonary disease: J44.89

## 2016-02-23 HISTORY — DX: Personal history of urinary calculi: Z87.442

## 2016-02-23 HISTORY — DX: Personal history of other diseases of the nervous system and sense organs: Z86.69

## 2016-02-23 HISTORY — DX: Chronic obstructive pulmonary disease, unspecified: J44.9

## 2016-02-23 HISTORY — DX: Personal history of other diseases of the circulatory system: Z86.79

## 2016-02-23 HISTORY — DX: Presence of urogenital implants: Z96.0

## 2016-02-23 LAB — POCT HEMOGLOBIN-HEMACUE: Hemoglobin: 14.8 g/dL (ref 12.0–15.0)

## 2016-02-23 SURGERY — CYSTOURETEROSCOPY, WITH STENT INSERTION
Anesthesia: General | Site: Ureter | Laterality: Right

## 2016-02-23 MED ORDER — ONDANSETRON HCL 4 MG/2ML IJ SOLN
INTRAMUSCULAR | Status: AC
  Filled 2016-02-23: qty 2

## 2016-02-23 MED ORDER — LACTATED RINGERS IV SOLN
INTRAVENOUS | Status: DC
  Administered 2016-02-23: 13:00:00 via INTRAVENOUS
  Filled 2016-02-23: qty 1000

## 2016-02-23 MED ORDER — LIDOCAINE 2% (20 MG/ML) 5 ML SYRINGE
INTRAMUSCULAR | Status: DC | PRN
  Administered 2016-02-23: 70 mg via INTRAVENOUS

## 2016-02-23 MED ORDER — ALBUTEROL SULFATE HFA 108 (90 BASE) MCG/ACT IN AERS
INHALATION_SPRAY | RESPIRATORY_TRACT | Status: DC | PRN
  Administered 2016-02-23: 2 via RESPIRATORY_TRACT

## 2016-02-23 MED ORDER — CEFAZOLIN SODIUM-DEXTROSE 2-4 GM/100ML-% IV SOLN
2.0000 g | INTRAVENOUS | Status: AC
Start: 2016-02-23 — End: 2016-02-23
  Administered 2016-02-23: 2 g via INTRAVENOUS
  Filled 2016-02-23: qty 100

## 2016-02-23 MED ORDER — SODIUM CHLORIDE 0.9 % IR SOLN
Status: DC | PRN
  Administered 2016-02-23: 4000 mL

## 2016-02-23 MED ORDER — MIDAZOLAM HCL 5 MG/5ML IJ SOLN
INTRAMUSCULAR | Status: DC | PRN
  Administered 2016-02-23: 2 mg via INTRAVENOUS

## 2016-02-23 MED ORDER — FENTANYL CITRATE (PF) 100 MCG/2ML IJ SOLN
INTRAMUSCULAR | Status: AC
  Filled 2016-02-23: qty 2

## 2016-02-23 MED ORDER — DEXAMETHASONE SODIUM PHOSPHATE 4 MG/ML IJ SOLN
INTRAMUSCULAR | Status: DC | PRN
  Administered 2016-02-23: 10 mg via INTRAVENOUS

## 2016-02-23 MED ORDER — PROMETHAZINE HCL 25 MG/ML IJ SOLN
6.2500 mg | INTRAMUSCULAR | Status: DC | PRN
  Filled 2016-02-23: qty 1

## 2016-02-23 MED ORDER — CEFAZOLIN IN D5W 1 GM/50ML IV SOLN
1.0000 g | INTRAVENOUS | Status: DC
  Filled 2016-02-23: qty 50

## 2016-02-23 MED ORDER — IOHEXOL 300 MG/ML  SOLN
INTRAMUSCULAR | Status: DC | PRN
  Administered 2016-02-23: 10 mL

## 2016-02-23 MED ORDER — PROPOFOL 10 MG/ML IV BOLUS
INTRAVENOUS | Status: AC
  Filled 2016-02-23: qty 20

## 2016-02-23 MED ORDER — ONDANSETRON HCL 4 MG/2ML IJ SOLN
INTRAMUSCULAR | Status: DC | PRN
  Administered 2016-02-23: 4 mg via INTRAVENOUS

## 2016-02-23 MED ORDER — FENTANYL CITRATE (PF) 100 MCG/2ML IJ SOLN
INTRAMUSCULAR | Status: DC | PRN
  Administered 2016-02-23: 100 ug via INTRAVENOUS

## 2016-02-23 MED ORDER — CEFAZOLIN SODIUM-DEXTROSE 2-4 GM/100ML-% IV SOLN
INTRAVENOUS | Status: AC
  Filled 2016-02-23: qty 100

## 2016-02-23 MED ORDER — FENTANYL CITRATE (PF) 100 MCG/2ML IJ SOLN
25.0000 ug | INTRAMUSCULAR | Status: DC | PRN
  Filled 2016-02-23: qty 1

## 2016-02-23 MED ORDER — PROPOFOL 10 MG/ML IV BOLUS
INTRAVENOUS | Status: DC | PRN
  Administered 2016-02-23: 200 mg via INTRAVENOUS

## 2016-02-23 MED ORDER — MIDAZOLAM HCL 2 MG/2ML IJ SOLN
INTRAMUSCULAR | Status: AC
  Filled 2016-02-23: qty 2

## 2016-02-23 MED ORDER — ALBUTEROL SULFATE HFA 108 (90 BASE) MCG/ACT IN AERS
INHALATION_SPRAY | RESPIRATORY_TRACT | Status: AC
  Filled 2016-02-23: qty 6.7

## 2016-02-23 SURGICAL SUPPLY — 29 items
BAG DRAIN URO-CYSTO SKYTR STRL (DRAIN) ×2 IMPLANT
BASKET LASER NITINOL 1.9FR (BASKET) IMPLANT
BASKET STNLS GEMINI 4WIRE 3FR (BASKET) IMPLANT
BASKET ZERO TIP NITINOL 2.4FR (BASKET) IMPLANT
CATH INTERMIT  6FR 70CM (CATHETERS) ×2 IMPLANT
CATH URET 5FR 28IN CONE TIP (BALLOONS)
CATH URET 5FR 28IN OPEN ENDED (CATHETERS) IMPLANT
CATH URET 5FR 70CM CONE TIP (BALLOONS) IMPLANT
CLOTH BEACON ORANGE TIMEOUT ST (SAFETY) ×2 IMPLANT
ELECT REM PT RETURN 9FT ADLT (ELECTROSURGICAL)
ELECTRODE REM PT RTRN 9FT ADLT (ELECTROSURGICAL) IMPLANT
GLOVE BIO SURGEON STRL SZ8 (GLOVE) ×2 IMPLANT
GOWN STRL REUS W/ TWL LRG LVL3 (GOWN DISPOSABLE) ×1 IMPLANT
GOWN STRL REUS W/ TWL XL LVL3 (GOWN DISPOSABLE) ×1 IMPLANT
GOWN STRL REUS W/TWL LRG LVL3 (GOWN DISPOSABLE) ×3 IMPLANT
GOWN STRL REUS W/TWL XL LVL3 (GOWN DISPOSABLE) ×1
GUIDEWIRE 0.038 PTFE COATED (WIRE) IMPLANT
GUIDEWIRE ANG ZIPWIRE 038X150 (WIRE) IMPLANT
GUIDEWIRE STR DUAL SENSOR (WIRE) IMPLANT
IV NS IRRIG 3000ML ARTHROMATIC (IV SOLUTION) ×4 IMPLANT
KIT BALLIN UROMAX 15FX10 (LABEL) IMPLANT
KIT BALLN UROMAX 15FX4 (MISCELLANEOUS) IMPLANT
KIT BALLN UROMAX 26 75X4 (MISCELLANEOUS)
KIT ROOM TURNOVER WOR (KITS) ×2 IMPLANT
MANIFOLD NEPTUNE II (INSTRUMENTS) IMPLANT
PACK CYSTO (CUSTOM PROCEDURE TRAY) ×2 IMPLANT
SET HIGH PRES BAL DIL (LABEL)
SHEATH ACCESS URETERAL 38CM (SHEATH) IMPLANT
TUBE CONNECTING 12X1/4 (SUCTIONS) IMPLANT

## 2016-02-23 NOTE — Anesthesia Procedure Notes (Signed)
Procedure Name: LMA Insertion Date/Time: 02/23/2016 3:00 PM Performed by: Wanita Chamberlain Pre-anesthesia Checklist: Patient identified, Timeout performed, Emergency Drugs available, Suction available and Patient being monitored Patient Re-evaluated:Patient Re-evaluated prior to inductionOxygen Delivery Method: Circle system utilized Preoxygenation: Pre-oxygenation with 100% oxygen Intubation Type: IV induction Ventilation: Mask ventilation without difficulty LMA: LMA inserted LMA Size: 4.0 Number of attempts: 1 Placement Confirmation: CO2 detector and breath sounds checked- equal and bilateral Tube secured with: Tape Dental Injury: Teeth and Oropharynx as per pre-operative assessment

## 2016-02-23 NOTE — H&P (Signed)
  H&P  Chief Complaint: Misplaced stent  History of Present Illness: Marissa Willis is a 60 y.o. year old female who is s/p percutaneous mgmt of a large right sided stone burden recently. She was left with a right sided J2 stent which was recently found to be misplaced, with the distal tip in the  Ureter and not the bladder. She now presents for ureteroscopic extraction.  Past Medical History:  Diagnosis Date  . Asthma with COPD ([place])   . Complex partial seizure disorder [facility]) neurologist-  dr Krista Blue   dx 2014  after x2 seiaures march and sept 2014  -- on keppra  . 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 History of kidney stones   . History of partial seizures    x2 seizure in 2014  dx complex parital seizures started on keppra-- per pt no seizures since off keppra  . OSA on CPAP   . Retained ureteral stent    right side migrated  . SUI (stress urinary incontinence, female)    mild    Past Surgical History:  Procedure Laterality Date  . ANAL FISSURE REPAIR  1994  . BLADDER SUSPENSION  1991  . 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 CYSTOSCOPY WITH URETEROSCOPY AND STENT PLACEMENT Right 12/09/2015   Procedure: CYSTOSCOPY WITH retrograde AND STENT PLACEMENT;  Surgeon: Franchot Gallo, MD;  Location: [facility]S;  Service: Urology;  Laterality: Right;  . IR GENERIC HISTORICAL  01/12/2016   IR URETERAL STENT RIGHT NEW ACCESS W/O SEP NEPHROSTOMY CATH 01/12/2016 Corrie Mckusick, DO [facility]-INTER[facility] RAD  . NEPHROLITHOTOMY Right 01/12/2016   Procedure: RIGHT  PERCUTANEOUS NEPHROLITHOTOMY  insertion double j stent flexible cystoscopy  removable right double j stent;  Surgeon: Franchot Gallo, MD;  Location: [facility];  Service: Urology;  Laterality: Right;  . TUBAL LIGATION  1991    Home Medications:  No prescriptions prior to admission.    Allergies: No Known  Allergies  Family History  Problem Relation Age of Onset  . Diabetes Mother   . Heart disease Mother   . Hypertension Mother   . Heart disease Father     Social History:  reports that she quit smoking about 2 years ago. Her smoking use included Cigarettes. She started smoking about 2 years ago. She has a 67.50 pack-year smoking history. She has never used smokeless tobacco. She reports that she drinks alcohol. She reports that she uses drugs, including Marijuana.  ROS: A complete review of systems was performed.  All systems are negative except for pertinent findings as noted.  Physical Exam:  Vital signs in last 24 hours:   General:  Alert and oriented, No acute distress HEENT: Normocephalic, atraumatic Neck: No JVD or lymphadenopathy Cardiovascular: Regular rate and rhythm Lungs: Clear bilaterally Abdomen: Soft, nontender, nondistended, no abdominal masses Back: No CVA tenderness Extremities: No edema Neurologic: Grossly intact  Laboratory Data:  No results found for this or any previous visit (from the past 24 hour(s)). No results found for this or any previous visit (from the past 240 hour(s)). Creatinine: No results for input(s): CREATININE in the last 168 hours.  Radiologic Imaging: No results found.  Impression/Assessment:  Migrated right ureteral stent  Plan:  Ureteroscopic extraction of right ureteral stent  [name] 02/23/2016, 7:07 AM  Lillette Boxer. Jonda Alanis MD

## 2016-02-23 NOTE — Anesthesia Preprocedure Evaluation (Signed)
Anesthesia Evaluation  Patient identified by MRN, date of birth, ID band Patient awake    Reviewed: Allergy & Precautions, NPO status , Patient's Chart, lab work & pertinent test results  Airway Mallampati: II  TM Distance: >3 FB Neck ROM: Full    Dental no notable dental hx.    Pulmonary asthma , sleep apnea and Continuous Positive Airway Pressure Ventilation , COPD, former smoker,    Pulmonary exam normal breath sounds clear to auscultation       Cardiovascular + Peripheral Vascular Disease  Normal cardiovascular exam Rhythm:Regular Rate:Normal     Neuro/Psych negative neurological ROS  negative psych ROS   GI/Hepatic negative GI ROS, Neg liver ROS,   Endo/Other  negative endocrine ROS  Renal/GU Renal disease  negative genitourinary   Musculoskeletal negative musculoskeletal ROS (+)   Abdominal   Peds negative pediatric ROS (+)  Hematology negative hematology ROS (+)   Anesthesia Other Findings   Reproductive/Obstetrics negative OB ROS                             Anesthesia Physical Anesthesia Plan  ASA: II  Anesthesia Plan: General   Post-op Pain Management:    Induction: Intravenous  Airway Management Planned: LMA  Additional Equipment:   Intra-op Plan:   Post-operative Plan: Extubation in OR  Informed Consent: I have reviewed the patients History and Physical, chart, labs and discussed the procedure including the risks, benefits and alternatives for the proposed anesthesia with the patient or authorized representative who has indicated his/her understanding and acceptance.   Dental advisory given  Plan Discussed with: CRNA  Anesthesia Plan Comments:         Anesthesia Quick Evaluation

## 2016-02-23 NOTE — Transfer of Care (Signed)
Immediate Anesthesia Transfer of Care Note  Patient: Marissa Willis  Procedure(s) Performed: Procedure(s): CYSTOSCOPY  AND STENT EXTRACTION (Right)  Patient Location: PACU  Anesthesia Type:General  Level of Consciousness: awake, alert , oriented and patient cooperative  Airway & Oxygen Therapy: Patient Spontanous Breathing and Patient connected to nasal cannula oxygen  Post-op Assessment: Report given to RN and Post -op Vital signs reviewed and stable  Post vital signs: Reviewed and stable  Last Vitals:  Vitals:   02/23/16 1237 02/23/16 1522  BP: 128/66 (P) 123/73  Pulse: 75   Resp: 16 (P) 13  Temp: 36.5 C (P) 36.8 C    Last Pain:  Vitals:   02/23/16 1237  TempSrc: Oral      Patients Stated Pain Goal: 6 ([id number])  Complications: No apparent anesthesia complications

## 2016-02-23 NOTE — Anesthesia Postprocedure Evaluation (Signed)
Anesthesia Post Note  Patient: Marissa Willis  Procedure(s) Performed: Procedure(s) (LRB): CYSTOSCOPY  AND STENT EXTRACTION (Right)  Patient location during evaluation: PACU Anesthesia Type: General Level of consciousness: awake and alert Pain management: pain level controlled Vital Signs Assessment: post-procedure vital signs reviewed and stable Respiratory status: spontaneous breathing, nonlabored ventilation, respiratory function stable and patient connected to nasal cannula oxygen Cardiovascular status: blood pressure returned to baseline and stable Postop Assessment: no signs of nausea or vomiting Anesthetic complications: no    Last Vitals:  Vitals:   02/23/16 1605 02/23/16 1631  BP:  136/63  Pulse: 67 (!) 55  Resp: 12 14  Temp:  36.7 C    Last Pain:  Vitals:   02/23/16 1237  TempSrc: Oral                 Yvette Loveless J

## 2016-02-23 NOTE — Op Note (Signed)
PATIENT:  Marissa Willis  PRE-OPERATIVE DIAGNOSIS: Migrated ureteral stent  POST-OPERATIVE DIAGNOSIS: History of migrated ureteral stent, with the distal tip now in the bladder  PROCEDURE: Cystoscopy, stent extraction  SURGEON:  Lillette Boxer. Carmyn Hamm, M.D.  ANESTHESIA:  General  EBL: None  DRAINS: None  LOCAL MEDICATIONS USED:  None  SPECIMEN:   None  INDICATION: [name] is a 60 year old female status post right percutaneous nephrolithotomy in July 2017.  A stent was placed postoperatively.  She came in last week for stent extraction.  Unfortunately, the stent was not present in the bladder-the proximal tip was most likely in the distal ureter.  The x-ray confirmed this.  She presents at this time for cystoscopy, and ureteroscopic stent extraction  Description of procedure: The patient was properly identified and marked (if applicable) in the holding area. They were then  taken to the operating room and placed on the table in a supine position. General anesthesia was then administered. Once fully anesthetized the patient was moved to the dorsolithotomy position and the genitalia and perineum were sterilely prepped and draped in standard fashion. An official timeout was then performed.  A 23 French panendoscope was advanced into the bladder.  The bladder was inspected circumferentially.  Ureteral orifices were normal in configuration and location.  There are no bladder tumors and no stones.  At this point, the distal end of the stent was present within the bladder.  The stent was grasped with the flexible graspers, and extracted intact.  The bladder was then drained and the procedure terminated.  The patient was awakened and taken to the PACU in stable condition.     PLAN OF CARE: Discharge to home after PACU  PATIENT DISPOSITION:  PACU - hemodynamically stable.

## 2016-02-23 NOTE — Anesthesia Preprocedure Evaluation (Incomplete Revision)
Anesthesia Evaluation  Patient identified by MRN, date of birth, ID band Patient awake    Reviewed: Allergy & Precautions, NPO status , Patient's Chart, lab work & pertinent test results  Airway Mallampati: II  TM Distance: >3 FB Neck ROM: Full    Dental no notable dental hx.    Pulmonary asthma , sleep apnea and Continuous Positive Airway Pressure Ventilation , COPD, former smoker,    Pulmonary exam normal breath sounds clear to auscultation       Cardiovascular + Peripheral Vascular Disease  negative cardio ROS Normal cardiovascular exam Rhythm:Regular Rate:Normal     Neuro/Psych negative neurological ROS  negative psych ROS   GI/Hepatic negative GI ROS, Neg liver ROS,   Endo/Other  negative endocrine ROS  Renal/GU Renal diseaseMigrated ureteral stent  negative genitourinary   Musculoskeletal negative musculoskeletal ROS (+)   Abdominal   Peds negative pediatric ROS (+)  Hematology negative hematology ROS (+)   Anesthesia Other Findings   Reproductive/Obstetrics negative OB ROS                            Anesthesia Physical Anesthesia Plan  ASA: II  Anesthesia Plan: General   Post-op Pain Management:    Induction: Intravenous  Airway Management Planned: LMA  Additional Equipment:   Intra-op Plan:   Post-operative Plan: Extubation in OR  Informed Consent: I have reviewed the patients History and Physical, chart, labs and discussed the procedure including the risks, benefits and alternatives for the proposed anesthesia with the patient or authorized representative who has indicated his/her understanding and acceptance.   Dental advisory given  Plan Discussed with: CRNA  Anesthesia Plan Comments:         Anesthesia Quick Evaluation

## 2016-02-23 NOTE — Discharge Instructions (Signed)
1. You may see some blood in the urine and may have some burning with urination for 48-72 hours. You also may notice that you have to urinate more frequently or urgently after your procedure which is normal.  2. You should call should you develop an inability urinate, fever > 101, persistent nausea and vomiting that prevents you from eating or drinking to stay hydrated.  3. If you have a stent, you will likely urinate more frequently and urgently until the stent is removed and you may experience some discomfort/pain in the lower abdomen and flank especially when urinating. You may take pain medication prescribed to you if needed for pain. You may also intermittently have blood in the urine until the stent is removed. 4. If you have a catheter, you will be taught how to take care of the catheter by the nursing staff prior to discharge from the hospital.  You may periodically feel a strong urge to void with the catheter in place.  This is a bladder spasm and most often can occur when having a bowel movement or moving around. It is typically self-limited and usually will stop after a few minutes.  You may use some Vaseline or Neosporin around the tip of the catheter to reduce friction at the tip of the penis. You may also see some blood in the urine.  A very small amount of blood can make the urine look quite red.  As long as the catheter is draining well, there usually is not a problem.  However, if the catheter is not draining well and is bloody, you should call the office [phone]) to notify us.   CYSTOSCOPY HOME CARE INSTRUCTIONS  Activity: Rest for the remainder of the day.  Do not drive or operate equipment today.  You may resume normal activities in one to two days as instructed by your physician.   Meals: Drink plenty of liquids and eat light foods such as gelatin or soup this evening.  You may return to a normal meal plan tomorrow.  Return to Work: You may return to work in one to two days  or as instructed by your physician.  Special Instructions / Symptoms: Call your physician if any of these symptoms occur:   -persistent or heavy bleeding  -bleeding which continues after first few urination  -large blood clots that are difficult to pass  -urine stream diminishes or stops completely  -fever equal to or higher than 101 degrees Farenheit.  -cloudy urine with a strong, foul odor  -severe pain  Females should always wipe from front to back after elimination.  You may feel some burning pain when you urinate.  This should disappear with time.  Applying moist heat to the lower abdomen or a hot tub bath may help relieve the pain. \  Patient Signature:  ________________________________________________________  Nurse's Signature:  ________________________________________________________  Post Anesthesia Home Care Instructions  Activity: Get plenty of rest for the remainder of the day. A responsible adult should stay with you for 24 hours following the procedure.  For the next 24 hours, DO NOT: -Drive a car -Paediatric nurse -Drink alcoholic beverages -Take any medication unless instructed by your physician -Make any legal decisions or sign important papers.  Meals: Start with liquid foods such as gelatin or soup. Progress to regular foods as tolerated. Avoid greasy, spicy, heavy foods. If nausea and/or vomiting occur, drink only clear liquids until the nausea and/or vomiting subsides. Call your physician if vomiting continues.  Special Instructions/Symptoms:  Your throat may feel dry or sore from the anesthesia or the breathing tube placed in your throat during surgery. If this causes discomfort, gargle with warm salt water. The discomfort should disappear within 24 hours.  If you had a scopolamine patch placed behind your ear for the management of post- operative nausea and/or vomiting:  1. The medication in the patch is effective for 72 hours, after which it should be  removed.  Wrap patch in a tissue and discard in the trash. Wash hands thoroughly with soap and water. 2. You may remove the patch earlier than 72 hours if you experience unpleasant side effects which may include dry mouth, dizziness or visual disturbances. 3. Avoid touching the patch. Wash your hands with soap and water after contact with the patch.

## 2016-02-24 ENCOUNTER — Encounter (HOSPITAL_BASED_OUTPATIENT_CLINIC_OR_DEPARTMENT_OTHER): Payer: Self-pay | Admitting: Urology

## 2016-03-15 MED FILL — BREO ELLIPTA 100-25 MCG INH: 100-25 | 30 days supply | Qty: 60 | Fill #8

## 2016-04-18 MED FILL — BREO ELLIPTA 100-25 MCG INH: 100-25 | 30 days supply | Qty: 60 | Fill #9

## 2016-05-15 MED FILL — BREO ELLIPTA 100-25 MCG INH: 100-25 | 30 days supply | Qty: 60 | Fill #10

## 2016-05-15 MED FILL — PROAIR RESPICLICK INHAL PWD: 108 (90 BAS | 50 days supply | Qty: 2 | Fill #2

## 2016-05-24 IMAGING — CT CT RENAL STONE PROTOCOL
2 of 4 series · 16 of 46 positions shown, 18 images · non-contrast
Comparison: None.

CLINICAL DATA: Bilateral flank pain radiating into the pelvis
today.

EXAM:
CT ABDOMEN AND PELVIS WITHOUT CONTRAST
TECHNIQUE: Multidetector CT imaging of the abdomen and pelvis was performed
following the standard protocol without IV contrast.

[Series 2: renal stone < 200 lbs 5.0 b31f · axial · 0.69mm/px · z∈[+677,+1072]mm · 13 of 87 slices shown, 15 images]
[im 4/87  soft-tissue]
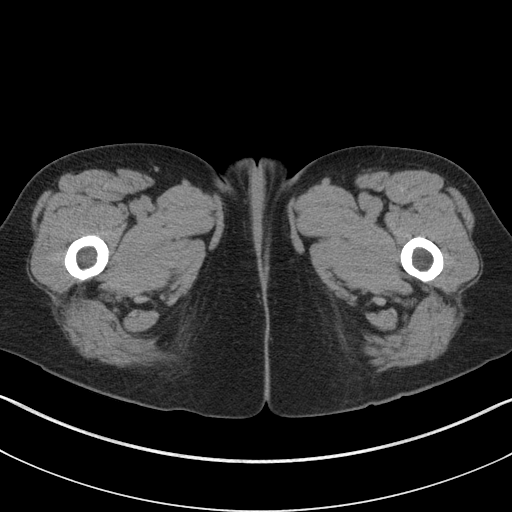
[im 4/87  bone]
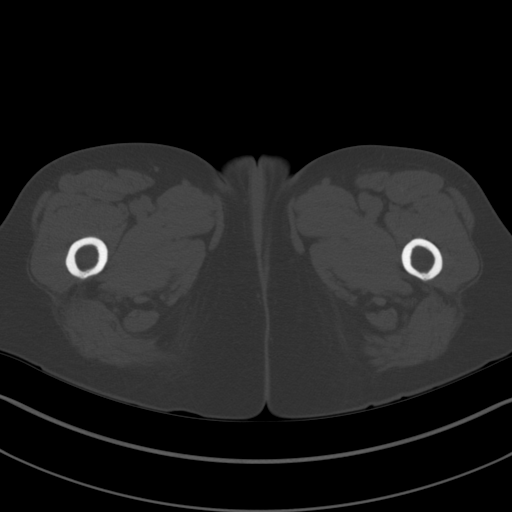
[im 11/87  soft-tissue]
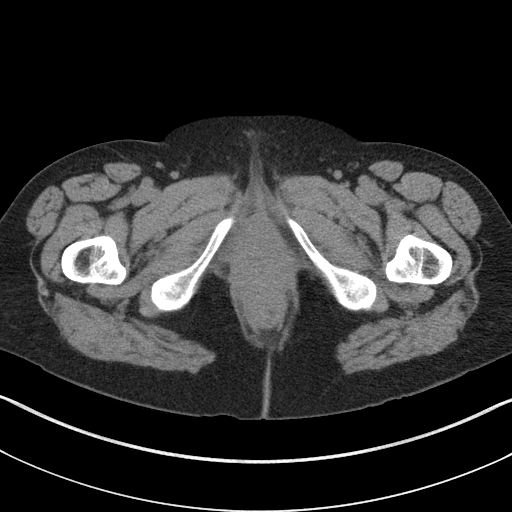
[im 18/87  soft-tissue]
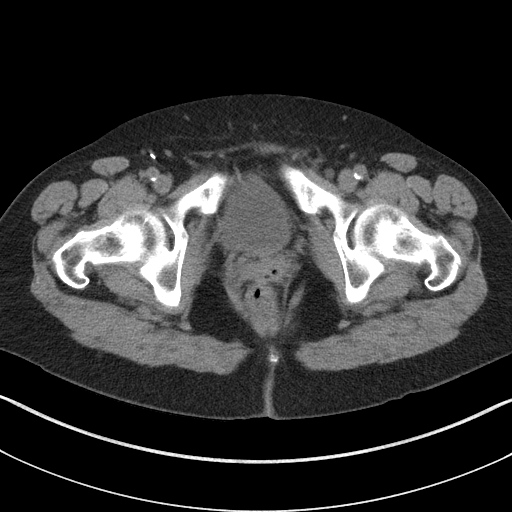
[im 25/87  soft-tissue]
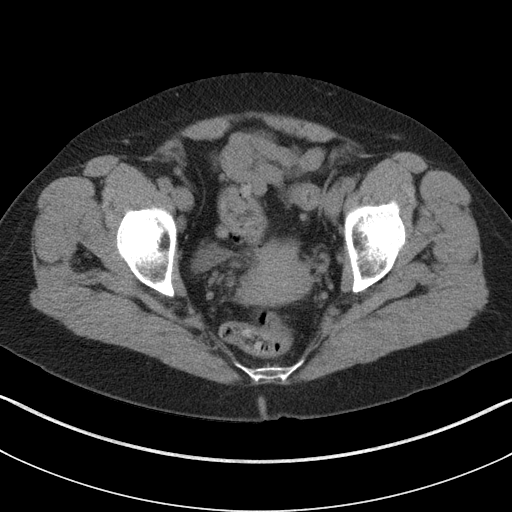
[im 31/87  soft-tissue]
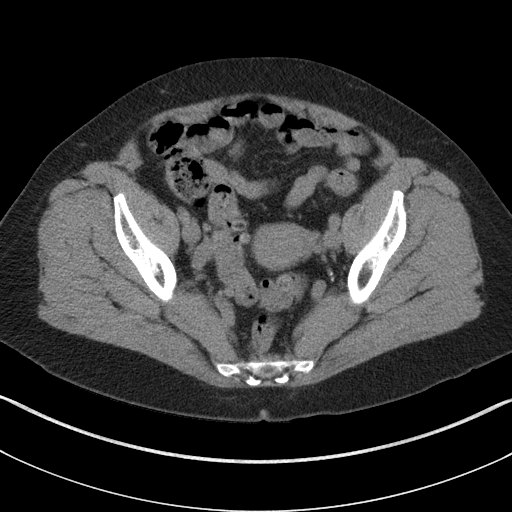
[im 38/87  soft-tissue]
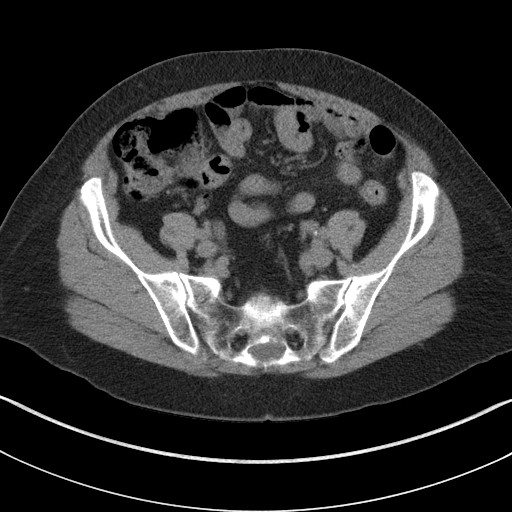
[im 45/87  soft-tissue]
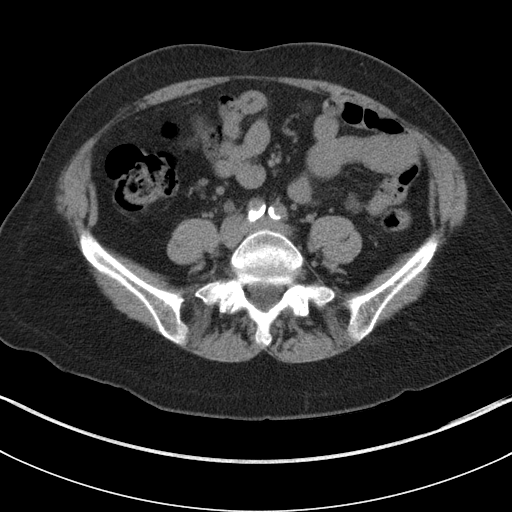
[im 49/87  soft-tissue]
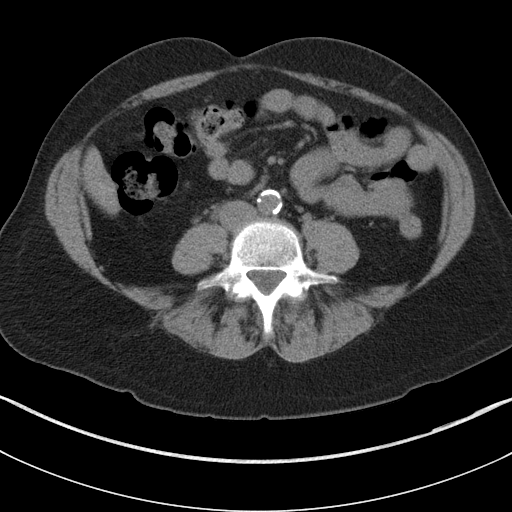
[im 56/87  soft-tissue]
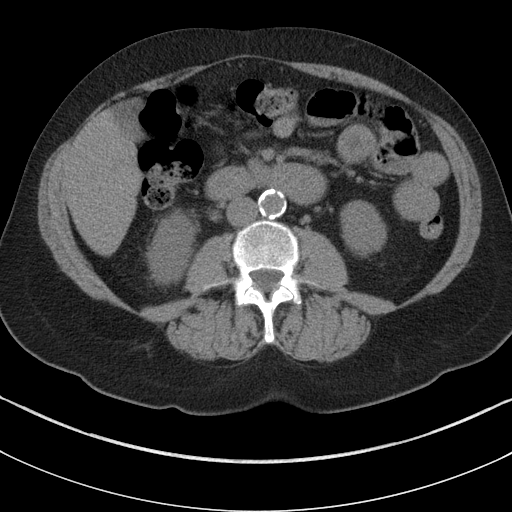
[im 56/87  bone]
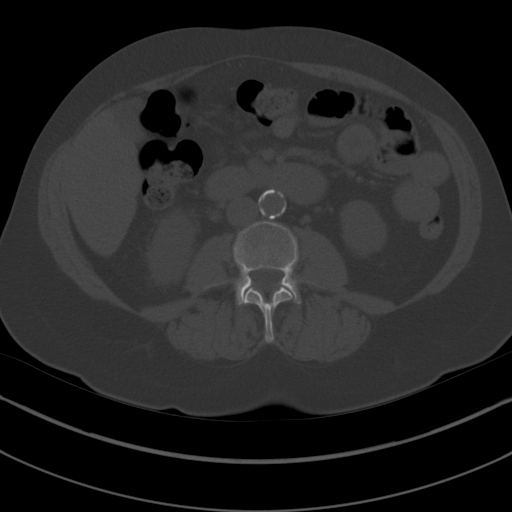
[im 62/87  soft-tissue]
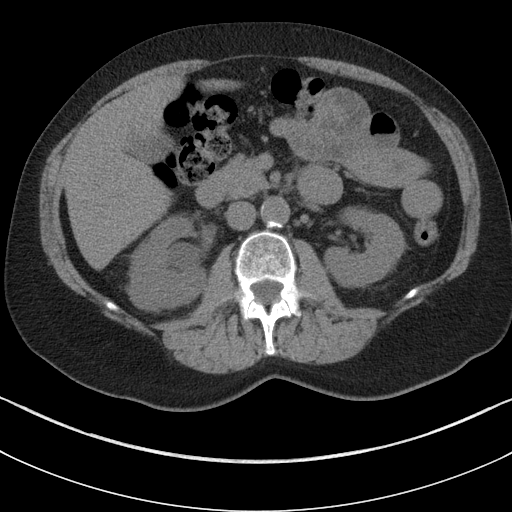
[im 69/87  soft-tissue]
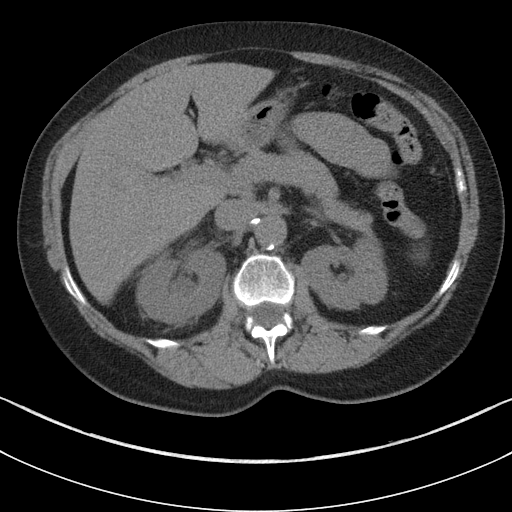
[im 76/87  soft-tissue]
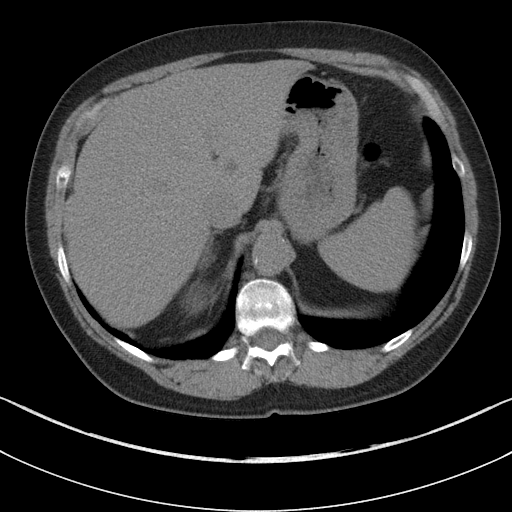
[im 83/87  soft-tissue]
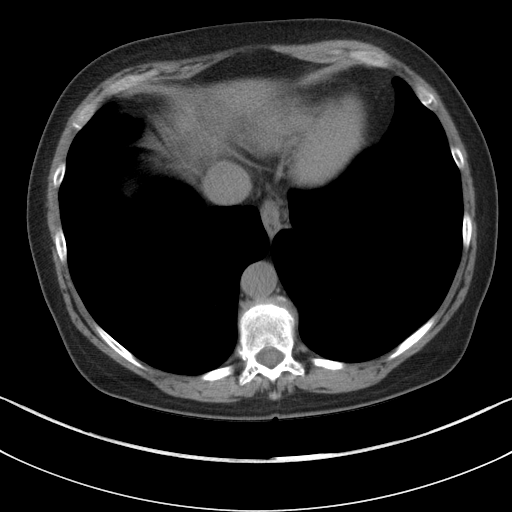

[Series 5: renal stone 3.0 coronal · coronal · 0.76mm/px · 3 of 84 slices shown]
[im 28/84  soft-tissue]
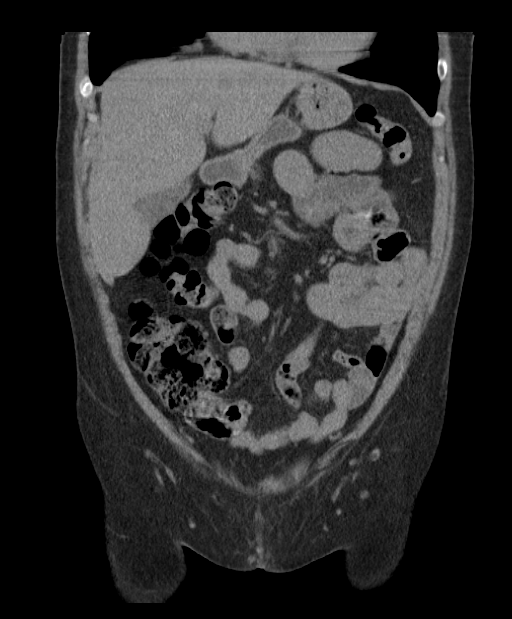
[im 37/84  soft-tissue]
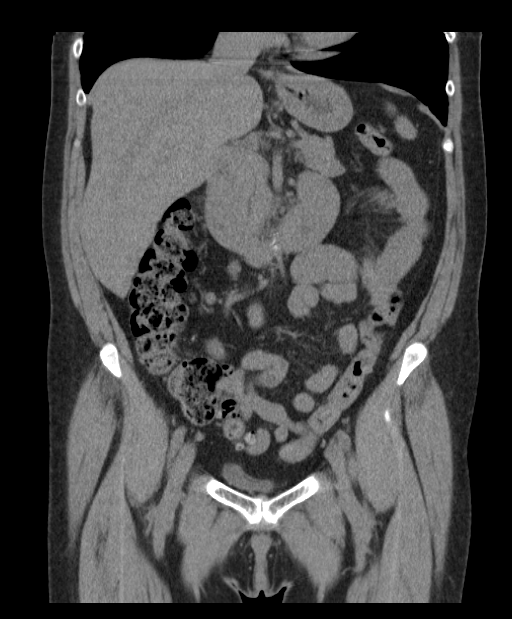
[im 47/84  soft-tissue]
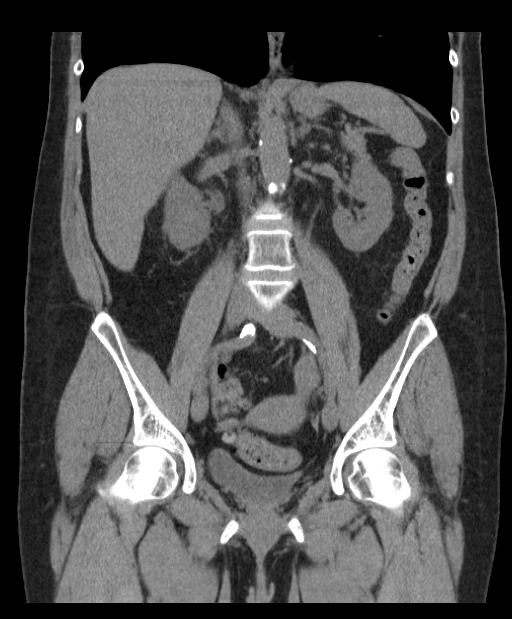

[16 of 46 positions shown; findings below may reference images not displayed]

FINDINGS: The lung bases are clear.  No pleural or pericardial effusion.

There is moderate right hydronephrosis with stranding about the
right kidney and ureter. Distal right ureteral stone measures 0.3 cm
transverse by 0.8 cm long. Two small nonobstructing stones are seen
in the lower pole the right kidney. There is a large stone in the
upper pole of the right kidney measuring 1.5 cm in diameter. No left
renal stones are noted.

A 1.1 cm low attenuating lesion in the caudate lobe of the liver on
image 11 is compatible with a cyst. The liver is otherwise
unremarkable. The gallbladder, adrenal glands, spleen and pancreas
appear normal. Uterus adnexa are unremarkable. Sigmoid
diverticulosis without diverticulitis is identified. The colon is
otherwise unremarkable. The stomach, small bowel and appendix appear
normal. There is no lymphadenopathy or fluid. Aortoiliac
atherosclerosis without aneurysm is identified. No focal bony
abnormality is seen.
IMPRESSION: Moderate right hydronephrosis due to a 0.3 cm transverse by 0.8 cm
long stone at the right UVJ. 3 additional nonobstructing stones are
identified in the right kidney. The largest measures 1.5 cm in
diameter.

Aortoiliac atherosclerosis without aneurysm.

## 2016-06-13 MED FILL — BREO ELLIPTA 100-25 MCG INH: 100-25 | 30 days supply | Qty: 60 | Fill #11

## 2016-08-09 ENCOUNTER — Emergency Department (INDEPENDENT_AMBULATORY_CARE_PROVIDER_SITE_OTHER): Payer: BLUE CROSS/BLUE SHIELD

## 2016-08-09 ENCOUNTER — Emergency Department
Admission: EM | Admit: 2016-08-09 | Discharge: 2016-08-09 | Disposition: A | Discharge status: Home / Self Care | Payer: BLUE CROSS/BLUE SHIELD | Source: Home / Self Care | Attending: Family Medicine | Admitting: Family Medicine

## 2016-08-09 DIAGNOSIS — S63632A Sprain of interphalangeal joint of right middle finger, initial encounter: Secondary | ICD-10-CM | POA: Diagnosis not present

## 2016-08-09 DIAGNOSIS — M7989 Other specified soft tissue disorders: Secondary | ICD-10-CM

## 2016-08-09 DIAGNOSIS — S63614A Unspecified sprain of right ring finger, initial encounter: Secondary | ICD-10-CM

## 2016-08-09 DIAGNOSIS — S60221A Contusion of right hand, initial encounter: Secondary | ICD-10-CM

## 2016-08-09 MED ORDER — HYDROCODONE-ACETAMINOPHEN 5-325 MG PO TABS: 1.0000 | ORAL_TABLET | Freq: Four times a day (QID) | ORAL | 0 refills | Status: DC | PRN

## 2016-08-09 MED ORDER — KETOROLAC TROMETHAMINE 60 MG/2ML IJ SOLN
30.0000 mg | Freq: Once | INTRAMUSCULAR | Status: AC
  Administered 2016-08-09: 30 mg via INTRAMUSCULAR

## 2016-08-09 NOTE — ED Triage Notes (Signed)
Pt was walking a dog, and another dog attacked her dog.  She ended up with the leash, and bag wrapped around hand.  Swelling in middle and ring finger, as well as bruising.

## 2016-08-09 NOTE — ED Provider Notes (Signed)
Marissa Willis CARE    CSN: [id number] Arrival date & time: 08/09/16  1552     History   Chief Complaint Chief Complaint  Patient presents with  . Hand Pain    right    HPI Marissa Willis is a 61 y.o. female.   While walking her dog about 4.5 hours ago, another dog attacked her dog.  The leash wrapped around her right hand/fingers, with consequent pain/swelling in her hand and 3rd and 4th fingers.  She is unable to remove her ring from 4th finger.   The history is provided by the patient.  Hand Pain  This is a new problem. The current episode started 3 to 5 hours ago. The problem occurs constantly. The problem has been gradually worsening. Exacerbated by: finger movement. Nothing relieves the symptoms. Treatments tried: ice pack. The treatment provided no relief.    Past Medical History:  Diagnosis Date  . Asthma with COPD ([place])   . Complex partial seizure disorder [facility]) neurologist-  dr Krista Blue   dx 2014  after x2 seiaures march and sept 2014  -- on keppra  . 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 History of kidney stones   . History of partial seizures    x2 seizure in 2014  dx complex parital seizures started on keppra-- per pt no seizures since off keppra  . OSA on CPAP   . Retained ureteral stent    right side migrated  . SUI (stress urinary incontinence, female)    mild    Patient Active Problem List   Diagnosis Date Noted  . Kidney calculi 01/12/2016  . Renal calculi 12/09/2015  . Bilateral lower extremity edema 07/06/2015  . Aortic atherosclerosis ([place]) 12/28/2014  . Former smoker 12/09/2013  . Hyperlipidemia 12/09/2013  . Complex partial seizure ([place]) 03/02/2013  . COPD (chronic obstructive pulmonary disease) ([place]) 12/26/2012  . Asthma 12/26/2012    Past Surgical History:  Procedure Laterality Date  . ANAL FISSURE REPAIR  1994  . BLADDER SUSPENSION  1991  . 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 CYSTOSCOPY WITH URETEROSCOPY AND STENT PLACEMENT Right 12/09/2015   Procedure: CYSTOSCOPY WITH retrograde AND STENT PLACEMENT;  Surgeon: Franchot Gallo, MD;  Location: [facility];  Service: Urology;  Laterality: Right;  . CYSTOSCOPY WITH URETEROSCOPY AND STENT PLACEMENT Right 02/23/2016   Procedure: CYSTOSCOPY  AND STENT EXTRACTION;  Surgeon: Franchot Gallo, MD;  Location: [facility];  Service: Urology;  Laterality: Right;  . IR GENERIC HISTORICAL  01/12/2016   IR URETERAL STENT RIGHT NEW ACCESS W/O SEP NEPHROSTOMY CATH 01/12/2016 Corrie Mckusick, DO [facility]  . NEPHROLITHOTOMY Right 01/12/2016   Procedure: RIGHT  PERCUTANEOUS NEPHROLITHOTOMY  insertion double j stent flexible cystoscopy  removable right double j stent;  Surgeon: Franchot Gallo, MD;  Location: [facility];  Service: Urology;  Laterality: Right;  . TUBAL LIGATION  1991    OB History    No data available       Home Medications    Prior to Admission medications   Medication Sig Start Date End Date Taking? Authorizing Provider  Albuterol Sulfate (PROAIR RESPICLICK) 123XX123 (90 Base) MCG/ACT AEPB Inhale 2 puffs into the lungs every 6 (six) hours as needed. Patient taking differently: Inhale 2 puffs into the lungs every 6 (six) hours as needed (shortness of breath/wheezing).  07/06/15   Jade L Breeback, PA-C  aspirin 81 MG tablet Take 81 mg by mouth daily.    Historical Provider, MD  docusate sodium (COLACE) 100 MG capsule Take 100 mg by mouth 2 (two) times daily as needed for mild constipation.    Historical Provider, MD  Fluticasone Furoate-Vilanterol (BREO ELLIPTA) 100-25 MCG/INH AEPB Inhale 1 puff into the lungs daily. Patient taking differently: Inhale 1 puff into the lungs every evening.  07/06/15   Jade L Breeback, PA-C  hydrochlorothiazide (HYDRODIURIL) 12.5 MG tablet Take 2 tablets (25 mg total) by mouth daily. As needed for lower  leg swelling. Patient taking differently: Take 25 mg by mouth daily as needed (swelling). As needed for lower leg swelling. 07/06/15   Jade L Breeback, PA-C  HYDROcodone-acetaminophen (NORCO/VICODIN) 5-325 MG tablet Take 1 tablet by mouth every 6 (six) hours as needed for moderate pain. 08/09/16   Kandra Nicolas, MD  oxyCODONE-acetaminophen (PERCOCET/ROXICET) 5-325 MG tablet Take 1 tablet by mouth every 4 (four) hours as needed for severe pain. 01/14/16   Lolita Rieger, MD  solifenacin (VESICARE) 10 MG tablet Take 10 mg by mouth daily.    Historical Provider, MD    Family History Family History  Problem Relation Age of Onset  . Diabetes Mother   . Heart disease Mother   . Hypertension Mother   . Heart disease Father     Social History Social History  Substance Use Topics  . Smoking status: Former Smoker    Packs/day: 1.50    Years: 45.00    Types: Cigarettes    Start date: 07/20/2013    Quit date: 01/09/2014  . Smokeless tobacco: Never Used  . Alcohol use 0.0 oz/week     Comment: 1-2 drinks daily     Allergies   Patient has no known allergies.   Review of Systems Review of Systems  All other systems reviewed and are negative.    Physical Exam Triage Vital Signs ED Triage Vitals  Enc Vitals Group     BP 08/09/16 1605 134/73     Pulse Rate 08/09/16 1605 83     Resp --      Temp 08/09/16 1605 98.8 F (37.1 C)     Temp Source 08/09/16 1605 Oral     SpO2 08/09/16 1605 95 %     Weight 08/09/16 1605 182 lb (82.6 kg)     Height 08/09/16 1605 5\' 4"  (1.626 m)     Head Circumference --      Peak Flow --      Pain Score 08/09/16 1606 6     Pain Loc --      Pain Edu? --      Excl. in [place]? --    No data found.   Updated Vital Signs BP 134/73 (BP Location: Left Arm)   Pulse 83   Temp 98.8 F (37.1 C) (Oral)   Ht 5\' 4"  (1.626 m)   Wt 182 lb (82.6 kg)   SpO2 95%   BMI 31.24 kg/m   Visual Acuity Right Eye Distance:   Left Eye Distance:   Bilateral Distance:     Right Eye Near:   Left Eye Near:    Bilateral Near:     Physical Exam  Constitutional: She appears well-developed and well-nourished. No distress.  HENT:  Head: Atraumatic.  Eyes: Conjunctivae are normal. Pupils are equal, round, and reactive to light.  Cardiovascular: Normal rate.   Pulmonary/Chest: Effort normal.  Musculoskeletal: She  exhibits tenderness.       Right hand: She exhibits decreased range of motion, tenderness, bony tenderness, decreased capillary refill and swelling. She exhibits normal two-point discrimination, no deformity and no laceration. Normal sensation noted. Decreased strength noted.       Hands: Right 3rd and 4th fingers diffusely swollen and tender to palpation, especially around PIP and MCP joints.  Decreased range of motion, although flexion/extension is intact.  IP joints are stable.  Distal neurovascular function is intact.  Unable to remove ring from base of right 4th finger because of swelling; however, there is no vascular compromise:  Good cap refill distally. There is swelling and mild tenderness dorsum of hand as noted on diagram.  Right wrist has full range of motion.                                                                                                                                                                  Neurological: She is alert.  Skin: Skin is warm and dry.  Nursing note and vitals reviewed.    UC Treatments / Results  Labs (all labs ordered are listed, but only abnormal results are displayed) Labs Reviewed - No data to display  EKG  EKG Interpretation None       Radiology Dg Hand Complete Right  Result Date: 08/09/2016 CLINICAL DATA:  Injury due to dog leash EXAM: RIGHT HAND - COMPLETE 3+ VIEW COMPARISON:  None. FINDINGS: Frontal, oblique, and lateral views obtained. There is soft tissue swelling of all digits, with soft tissue swelling most severe involving the second, third, and fourth digits. There is no  evident fracture or dislocation. There is mild narrowing of the third MCP joint. There is more extensive osteoarthritic change in the scaphotrapezial joint. The joint spaces appear normal. No erosive change or bony destruction. IMPRESSION: Soft tissue swelling of all digits, most severely involving the second, third, and fourth digits. No acute fracture or dislocation. 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 No erosive changes. Electronically Signed   By: Lowella Grip III M.D.   On: 08/09/2016 16:18    Procedures Procedures  Removed ring from right fourth finger using "string" technique.  Patient tolerated well.  Medications Ordered in UC Medications  ketorolac (TORADOL) injection 30 mg (30 mg Intramuscular Given 08/09/16 1717)     Initial Impression / Assessment and Plan / UC Course  I have reviewed the triage vital signs and the nursing notes.  Pertinent labs & imaging results that were available during my care of the patient were reviewed by me and considered in my medical decision making (see chart for details).    Administered Toradol 30mg  IM (renal dosing). Ace  wrap applied to hand. Fingers strapped using "Buddy Tape" technique.  Rx for Lortab. Buddy tape fingers and wear ace wrap.  Elevate hand.  Apply ice pack for 10 to 15 minutes, every one to two hours.  Continue until pain and swelling decrease.  Followup with  Dr. Lynne Leader ([facility]) tomorrow.    Final Clinical Impressions(s) / UC Diagnoses   Final diagnoses:  Sprain of interphalangeal joint of right middle finger, initial encounter  Sprain of right ring finger, initial encounter  Contusion of right hand, initial encounter    New Prescriptions New Prescriptions   HYDROCODONE-ACETAMINOPHEN (NORCO/VICODIN) 5-325 MG TABLET    Take 1 tablet by mouth every 6 (six) hours as needed for moderate pain.     Kandra Nicolas,  MD 08/09/16 2204

## 2016-08-09 NOTE — Discharge Instructions (Signed)
Buddy tape fingers and wear ace wrap.  Elevate hand.  Apply ice pack for 10 to 15 minutes, every one to two hours.  Continue until pain and swelling decrease.

## 2016-08-10 ENCOUNTER — Ambulatory Visit (INDEPENDENT_AMBULATORY_CARE_PROVIDER_SITE_OTHER): Payer: BLUE CROSS/BLUE SHIELD | Admitting: Family Medicine

## 2016-08-10 ENCOUNTER — Ambulatory Visit (INDEPENDENT_AMBULATORY_CARE_PROVIDER_SITE_OTHER): Payer: BLUE CROSS/BLUE SHIELD

## 2016-08-10 ENCOUNTER — Encounter: Payer: Self-pay | Admitting: Family Medicine

## 2016-08-10 DIAGNOSIS — S62612D Displaced fracture of proximal phalanx of right middle finger, subsequent encounter for fracture with routine healing: Secondary | ICD-10-CM

## 2016-08-10 DIAGNOSIS — S6991XA Unspecified injury of right wrist, hand and finger(s), initial encounter: Secondary | ICD-10-CM | POA: Insufficient documentation

## 2016-08-10 DIAGNOSIS — W4909XD Other specified item causing external constriction, subsequent encounter: Secondary | ICD-10-CM

## 2016-08-10 MED FILL — HYDROCODON-APAP 5-325: 5-325 | 3 days supply | Qty: 12 | Fill #0

## 2016-08-10 NOTE — Progress Notes (Signed)
Subjective:    I'm seeing this patient as a consultation for:  Dr Assunta Found  CC: Right hand injury.   HPI: Patient works as a Development worker, community. She was walking dogs yesterday when her dog got into a fight with another dog. The leash became tangled in her hand. When she got the dog separated she noted that her 3rd digit was hurting significantly and seemed to be displaced to the ulnar side of her hand. She presented to urgent care where x-rays were unremarkable. She was asked to follow-up with me today. She notes bruising and swelling but denies any radiating pain weakness or numbness. She notes decreased motion due to pain. Symptoms are moderate to severe.  Past medical history, Surgical history, Family history not pertinant except as noted below, Social history, Allergies, and medications have been entered into the medical record, reviewed, and no changes needed.   Review of Systems: No headache, visual changes, nausea, vomiting, diarrhea, constipation, dizziness, abdominal pain, skin rash, fevers, chills, night sweats, weight loss, swollen lymph nodes, body aches, joint swelling, muscle aches, chest pain, shortness of breath, mood changes, visual or auditory hallucinations.   Objective:    Vitals:   08/10/16 1122  BP: 122/65  Pulse: 64   General: Well Developed, well nourished, and in no acute distress.  Neuro/Psych: Alert and oriented x3, extra-ocular muscles intact, able to move all 4 extremities, sensation grossly intact. Skin: Warm and dry, no rashes noted.  Respiratory: Not using accessory muscles, speaking in full sentences, trachea midline.  Cardiovascular: Pulses palpable, no extremity edema. Abdomen: Does not appear distended. MSK: Right hand significant ecchymosis and swelling. Ulnar deviation of the third digit especially at the MCP. Scissoring is present with hand flexion. Patient has intact extension and flexion strength at the DIP and PIP.  Hand x-ray was repeated today revealing  no acute fractures.  No results found for this or any previous visit (from the past 24 hour(s)). Dg Hand Complete Right  Result Date: 08/09/2016 CLINICAL DATA:  Injury due to dog leash EXAM: RIGHT HAND - COMPLETE 3+ VIEW COMPARISON:  None. FINDINGS: Frontal, oblique, and lateral views obtained. There is soft tissue swelling of all digits, with soft tissue swelling most severe involving the second, third, and fourth digits. There is no evident fracture or dislocation. There is mild narrowing of the third MCP joint. There is more extensive osteoarthritic change in the scaphotrapezial joint. The joint spaces appear normal. No erosive change or bony destruction. IMPRESSION: Soft tissue swelling of all digits, most severely involving the second, third, and fourth digits. No acute fracture or dislocation. 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 No erosive changes. Electronically Signed   By: Lowella Grip III M.D.   On: 08/09/2016 16:18    Patient's second and third digits for buddy taped forcing the third digit to a more normal position. A large ulnar gutter slab-type splint was applied covering digits 2-5.   Patient felt better with splint applied.  Impression and Recommendations:    Assessment and Plan: 61 y.o. female with Finger injury. I'm concerned patient has disruptive on the collateral ligaments at her MCP. Specifically I'm worried about the radial collateral ligament of the third MCP.  X-ray shows no dislocation or fracture. I buddy taped and splinted the finger in a position of anatomical alignment. Plan to recheck in 1 week.    Discussed warning signs or symptoms. Please see discharge  instructions. Patient expresses understanding.

## 2016-08-10 NOTE — Patient Instructions (Signed)
Thank you for coming in today. Recheck in about 1 week.  Return sooner if needed.    Cast or Splint Care Casts and splints support injured limbs and keep bones from moving while they heal.  HOME CARE  Keep the cast or splint uncovered during the drying period.  A plaster cast can take 24 to 48 hours to dry.  A fiberglass cast will dry in less than 1 hour.  Do not rest the cast on anything harder than a pillow for 24 hours.  Do not put weight on your injured limb. Do not put pressure on the cast. Wait for your doctor's approval.  Keep the cast or splint dry.  Cover the cast or splint with a plastic bag during baths or wet weather.  If you have a cast over your chest and belly (trunk), take sponge baths until the cast is taken off.  If your cast gets wet, dry it with a towel or blow dryer. Use the cool setting on the blow dryer.  Keep your cast or splint clean. Wash a dirty cast with a damp cloth.  Do not put any objects under your cast or splint.  Do not scratch the skin under the cast with an object. If itching is a problem, use a blow dryer on a cool setting over the itchy area.  Do not trim or cut your cast.  Do not take out the padding from inside your cast.  Exercise your joints near the cast as told by your doctor.  Raise (elevate) your injured limb on 1 or 2 pillows for the first 1 to 3 days. GET HELP IF:  Your cast or splint cracks.  Your cast or splint is too tight or too loose.  You itch badly under the cast.  Your cast gets wet or has a soft spot.  You have a bad smell coming from the cast.  You get an object stuck under the cast.  Your skin around the cast becomes red or sore.  You have new or more pain after the cast is put on. GET HELP RIGHT AWAY IF:  You have fluid leaking through the cast.  You cannot move your fingers or toes.  Your fingers or toes turn blue or white or are cool, painful, or puffy (swollen).  You have tingling or lose  feeling (numbness) around the injured area.  You have bad pain or pressure under the cast.  You have trouble breathing or have shortness of breath.  You have chest pain. This information is not intended to replace advice given to you by your health care provider. Make sure you discuss any questions you have with your health care provider. Document Released: 10/04/2010 Document Revised: 02/04/2013 Document Reviewed: 12/11/2012 Elsevier Interactive Patient Education  2017 [employer].

## 2016-08-17 ENCOUNTER — Ambulatory Visit (INDEPENDENT_AMBULATORY_CARE_PROVIDER_SITE_OTHER): Payer: BLUE CROSS/BLUE SHIELD | Admitting: Family Medicine

## 2016-08-17 ENCOUNTER — Encounter: Payer: Self-pay | Admitting: Family Medicine

## 2016-08-17 VITALS — BP 134/68 | HR 68 | Wt 179.0 lb

## 2016-08-17 DIAGNOSIS — S6991XA Unspecified injury of right wrist, hand and finger(s), initial encounter: Secondary | ICD-10-CM | POA: Diagnosis not present

## 2016-08-17 NOTE — Progress Notes (Signed)
Marissa Willis is a 61 y.o. female who presents to [place] today for hand injury. Patient is being seen several times for head injury. On the 23rd she was found to have scissoring with grip at the third digit. She was placed into a large mitten-type gutter splint covering all 4 fingers. The third digit was buddy taped to the second digit to achieve more anatomical alignment. Repeat x-ray did show a fracture at the base third proximal phalanx with rotation present. Upon recheck today she notes her hand is still swollen. She notes pain is improved. She notes that she absolutely cannot go to hand surgeon due to cost.   Past Medical History:  Diagnosis Date  . Asthma with COPD ([place])   . Complex partial seizure disorder [facility]) neurologist-  dr Krista Blue   dx 2014  after x2 seiaures march and sept 2014  -- on keppra  . 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 History of kidney stones   . History of partial seizures    x2 seizure in 2014  dx complex parital seizures started on keppra-- per pt no seizures since off keppra  . OSA on CPAP   . Retained ureteral stent    right side migrated  . SUI (stress urinary incontinence, female)    mild   Past Surgical History:  Procedure Laterality Date  . ANAL FISSURE REPAIR  1994  . BLADDER SUSPENSION  1991  . 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 CYSTOSCOPY WITH URETEROSCOPY AND STENT PLACEMENT Right 12/09/2015   Procedure: CYSTOSCOPY WITH retrograde AND STENT PLACEMENT;  Surgeon: Franchot Gallo, MD;  Location: [facility];  Service: Urology;  Laterality: Right;  . CYSTOSCOPY WITH URETEROSCOPY AND STENT PLACEMENT Right 02/23/2016   Procedure: CYSTOSCOPY  AND STENT EXTRACTION;  Surgeon: Franchot Gallo, MD;  Location: [facility];  Service: Urology;  Laterality: Right;  .  IR GENERIC HISTORICAL  01/12/2016   IR URETERAL STENT RIGHT NEW ACCESS W/O SEP NEPHROSTOMY CATH 01/12/2016 Corrie Mckusick, DO [facility]  . NEPHROLITHOTOMY Right 01/12/2016   Procedure: RIGHT  PERCUTANEOUS NEPHROLITHOTOMY  insertion double j stent flexible cystoscopy  removable right double j stent;  Surgeon: Franchot Gallo, MD;  Location[facility];  Service: Urology;  Laterality: Right;  . TUBAL LIGATION  1991   Social History  Substance Use Topics  . Smoking status: Former Smoker    Packs/day: 1.50    Years: 45.00    Types: Cigarettes    Start date: 07/20/2013    Quit date: 01/09/2014  . Smokeless tobacco: Never Used  . Alcohol use 0.0 oz/week     Comment: 1-2 drinks daily     ROS:  As above   Medications: Current Outpatient Prescriptions  Medication Sig Dispense Refill  . Albuterol Sulfate (PROAIR RESPICLICK) 123XX123 (90 Base) MCG/ACT AEPB Inhale 2 puffs into the lungs every 6 (six) hours as needed. (Patient taking differently: Inhale 2 puffs into the lungs every 6 (six) hours as needed (shortness of breath/wheezing). ) 2 each 2  . aspirin 81 MG tablet Take 81 mg by mouth daily.    [name] docusate sodium (COLACE) 100 MG capsule Take 100 mg by mouth 2 (two) times daily as needed for mild constipation.    . Fluticasone Furoate-Vilanterol (BREO ELLIPTA) 100-25 MCG/INH AEPB Inhale 1 puff into  the lungs daily. (Patient taking differently: Inhale 1 puff into the lungs every evening. ) 1 each 11  . hydrochlorothiazide (HYDRODIURIL) 12.5 MG tablet Take 2 tablets (25 mg total) by mouth daily. As needed for lower leg swelling. (Patient taking differently: Take 25 mg by mouth daily as needed (swelling). As needed for lower leg swelling.) 30 tablet 1  . HYDROcodone-acetaminophen (NORCO/VICODIN) 5-325 MG tablet Take 1 tablet by mouth every 6 (six) hours as needed for moderate pain. 12 tablet 0  . oxyCODONE-acetaminophen (PERCOCET/ROXICET) 5-325 MG tablet Take 1 tablet by mouth every 4 (four) hours as  needed for severe pain. 20 tablet 0  . solifenacin (VESICARE) 10 MG tablet Take 10 mg by mouth daily.     No current facility-administered medications for this visit.    No Known Allergies   Exam:  BP 134/68   Pulse 68   Wt 179 lb (81.2 kg)   BMI 30.73 kg/m  General: Well Developed, well nourished, and in no acute distress.  Neuro/Psych: Alert and oriented x3, extra-ocular muscles intact, able to move all 4 extremities, sensation grossly intact. Skin: Warm and dry, no rashes noted.  Respiratory: Not using accessory muscles, speaking in full sentences, trachea midline.  Cardiovascular: Pulses palpable, no extremity edema. Abdomen: Does not appear distended. MSK: Right hand: Still significantly swollen. Slight ulnar deviation of the third digit present with scissoring with hand flexion. Tenderness remains. Capillary refill sensation intact.   Study Result   CLINICAL DATA:  Hand injury third finger.  EXAM: RIGHT HAND - COMPLETE 3+ VIEW  COMPARISON:  08/09/2016  FINDINGS: Joint space narrowing involving the third MCP joint is unchanged. There is poor definition of the articular cortex at the base of the third proximal phalanx as seen previously. There appears to be intra-articular fracture at the base of third metacarpal best seen on the lateral view. No change in alignment since yesterday  Mild rotational deformity of the third finger. No other fracture. Arthropathy in the scaphotrapezium joint.  IMPRESSION: Intraarticular fracture base of the third proximal phalanx similar to yesterday. Mild rotation of third finger.   Electronically Signed   By: Franchot Gallo M.D.   On: 08/10/2016 14:       Assessment and Plan: 61 y.o. female with Fracture of rotational deformity of the right hand present. Ideally the patient at this point should see a hand surgeon but she refused to go. She understands the risk of healing and a poor anatomical alignment. Plan to  continue conservative management with buddy tape splint. Recheck in a week. At that time swelling will likely be diminished and we can proceed with a cast.    No orders of the defined types were placed in this encounter.   Discussed warning signs or symptoms. Please see discharge instructions. Patient expresses understanding.

## 2016-08-17 NOTE — Patient Instructions (Signed)
Thank you for coming in today. Recheck in 1 week or so.  Continue splint and buddy tape.  Return sooner if needed.

## 2016-08-24 ENCOUNTER — Ambulatory Visit (INDEPENDENT_AMBULATORY_CARE_PROVIDER_SITE_OTHER): Payer: BLUE CROSS/BLUE SHIELD | Admitting: Family Medicine

## 2016-08-24 DIAGNOSIS — S62642A Nondisplaced fracture of proximal phalanx of right middle finger, initial encounter for closed fracture: Secondary | ICD-10-CM | POA: Diagnosis not present

## 2016-08-24 DIAGNOSIS — S62609A Fracture of unspecified phalanx of unspecified finger, initial encounter for closed fracture: Secondary | ICD-10-CM | POA: Insufficient documentation

## 2016-08-24 NOTE — Progress Notes (Signed)
Marissa Willis is a 61 y.o. female who presents to [place] today for follow up right hand injury.  Marissa Willis was originally seen on 2/23 for hand injury and subsequently diagnosed with a fracture of her right third digit base proximal phalanx fracture at the MCP causing scissoring ulnarly.   Past Medical History:  Diagnosis Date  . Asthma with COPD ([place])   . Complex partial seizure disorder [facility]) neurologist-  dr Krista Blue   dx 2014  after x2 seiaures march and sept 2014  -- on keppra  . 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 History of kidney stones   . History of partial seizures    x2 seizure in 2014  dx complex parital seizures started on keppra-- per pt no seizures since off keppra  . OSA on CPAP   . Retained ureteral stent    right side migrated  . SUI (stress urinary incontinence, female)    mild   Past Surgical History:  Procedure Laterality Date  . ANAL FISSURE REPAIR  1994  . BLADDER SUSPENSION  1991  . CEREBRAL EMBOLIZATION  2011     at [facility]   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  . CYSTOSCOPY WITH URETEROSCOPY AND STENT PLACEMENT Right 12/09/2015   Procedure: CYSTOSCOPY WITH retrograde AND STENT PLACEMENT;  Surgeon: Franchot Gallo, MD;  Location[facility];  Service: Urology;  Laterality: Right;  . CYSTOSCOPY WITH URETEROSCOPY AND STENT PLACEMENT Right 02/23/2016   Procedure: CYSTOSCOPY  AND STENT EXTRACTION;  Surgeon: Franchot Gallo, MD;  Location: [facility];  Service: Urology;  Laterality: Right;  . IR GENERIC HISTORICAL  01/12/2016   IR URETERAL STENT RIGHT NEW ACCESS W/O SEP NEPHROSTOMY CATH 01/12/2016 Corrie Mckusick, DO [facility]  . NEPHROLITHOTOMY Right 01/12/2016   Procedure: RIGHT  PERCUTANEOUS NEPHROLITHOTOMY  insertion double j stent flexible cystoscopy  removable right double j stent;  Surgeon: Franchot Gallo,  MD;  [facility];  Service: Urology;  Laterality: Right;  . TUBAL LIGATION  1991   Social History  Substance Use Topics  . Smoking status: Former Smoker    Packs/day: 1.50    Years: 45.00    Types: Cigarettes    Start date: 07/20/2013    Quit date: 01/09/2014  . Smokeless tobacco: Never Used  . Alcohol use 0.0 oz/week     Comment: 1-2 drinks daily     ROS:  As above   Medications: Current Outpatient Prescriptions  Medication Sig Dispense Refill  . Albuterol Sulfate (PROAIR RESPICLICK) 371 (90 Base) MCG/ACT AEPB Inhale 2 puffs into the lungs every 6 (six) hours as needed. (Patient taking differently: Inhale 2 puffs into the lungs every 6 (six) hours as needed (shortness of breath/wheezing). ) 2 each 2  . aspirin 81 MG tablet Take 81 mg by mouth daily.    [name] docusate sodium (COLACE) 100 MG capsule Take 100 mg by mouth 2 (two) times daily as needed for mild constipation.    . Fluticasone Furoate-Vilanterol (BREO ELLIPTA) 100-25 MCG/INH AEPB Inhale 1 puff into the lungs daily. (Patient taking differently: Inhale 1 puff into the lungs every evening. ) 1 each 11  . hydrochlorothiazide (HYDRODIURIL) 12.5 MG tablet Take 2 tablets (25 mg total) by mouth daily. As needed for lower leg swelling. (Patient taking differently: Take 25 mg by mouth daily as needed (swelling). As needed for lower leg swelling.) 30 tablet 1  .  HYDROcodone-acetaminophen (NORCO/VICODIN) 5-325 MG tablet Take 1 tablet by mouth every 6 (six) hours as needed for moderate pain. 12 tablet 0  . oxyCODONE-acetaminophen (PERCOCET/ROXICET) 5-325 MG tablet Take 1 tablet by mouth every 4 (four) hours as needed for severe pain. 20 tablet 0  . solifenacin (VESICARE) 10 MG tablet Take 10 mg by mouth daily.     No current facility-administered medications for this visit.    No Known Allergies   Exam:  BP 127/64 (BP Location: Left Arm, Patient Position: Sitting, Cuff Size: Normal)   Pulse 69   Temp 98.1 F (36.7 C) (Oral)    Resp 18   Wt 181 lb (82.1 kg)   SpO2 97%   BMI 31.07 kg/m  General: Well Developed, well nourished, and in no acute distress.  Neuro/Psych: Alert and oriented x3, extra-ocular muscles intact, able to move all 4 extremities, sensation grossly intact. Skin: Warm and dry, no rashes noted.  Respiratory: Not using accessory muscles, speaking in full sentences, trachea midline.  Cardiovascular: Pulses palpable, no extremity edema. Abdomen: Does not appear distended. MSK: right hand 3rd MCP fracture with rotational deformity causing scissor deformity with flexion. Mildly tender. Capillary refill intact distally    No results found for this or any previous visit (from the past 48 hour(s)). No results found.    Assessment and Plan: 61 y.o. female with right hand fracture with scissor deformity. Patient refuses to go to a Copy. Landed treat conservatively with casting. Patient was placed in a cast controlling the second and third digits with flexion at MCP crossing the wrist. The thumb fourth and fifth digits for left free. Recheck in 2 weeks.    No orders of the defined types were placed in this encounter.   Discussed warning signs or symptoms. Please see discharge instructions. Patient expresses understanding.

## 2016-08-24 NOTE — Patient Instructions (Signed)
Thank you for coming in today. Return in 2 weeks for recheck.    Cast or Splint Care, Adult Casts and splints are supports that are worn to protect broken bones and other injuries. A cast or splint may hold a bone still and in the correct position while it heals. Casts and splints may also help to ease pain, swelling, and muscle spasms. How to care for your cast  Do not stick anything inside the cast to scratch your skin.  Check the skin around the cast every day. Tell your doctor about any concerns.  You may put lotion on dry skin around the edges of the cast. Do not put lotion on the skin under the cast.  Keep the cast clean.  If the cast is not waterproof:  Do not let it get wet.  Cover it with a watertight covering when you take a bath or a shower. How to care for your splint  Wear it as told by your doctor. Take it off only as told by your doctor.  Loosen the splint if your fingers or toes tingle, get numb, or turn cold and blue.  Keep the splint clean.  If the splint is not waterproof:  Do not let it get wet.  Cover it with a watertight covering when you take a bath or a shower. Follow these instructions at home: Bathing   Do not take baths or swim until your doctor says it is okay. Ask your doctor if you can take showers. You may only be allowed to take sponge baths for bathing.  If your cast or splint is not waterproof, cover it with a watertight covering when you take a bath or shower. Managing pain, stiffness, and swelling   Move your fingers or toes often to avoid stiffness and to lessen swelling.  Raise (elevate) the injured area above the level of your heart while sitting or lying down. Safety   Do not use the injured limb to support your body weight until your doctor says that it is okay.  Use crutches or other assistive devices as told by your doctor. General instructions   Do not put pressure on any part of the cast or splint until it is fully  hardened. This may take many hours.  Return to your normal activities as told by your doctor. Ask your doctor what activities are safe for you.  Keep all follow-up visits as told by your doctor. This is important. Contact a doctor if:  Your cast or splint gets damaged.  The skin around the cast gets red or raw.  The skin under the cast is very itchy or painful.  Your cast or splint feels very uncomfortable.  Your cast or splint is too tight or too loose.  Your cast becomes wet or it starts to have a soft spot or area.  You get an object stuck under your cast. Get help right away if:  Your pain gets worse.  The injured area tingles, gets numb, or turns blue and cold.  The part of your body above or below the cast is swollen and it turns a different color (is discolored).  You cannot feel or move your fingers or toes.  There is fluid leaking through the cast.  You have very bad pain or pressure under the cast.  You have trouble breathing.  You have shortness of breath.  You have chest pain. This information is not intended to replace advice given to you by your health  care provider. Make sure you discuss any questions you have with your health care provider. Document Released: 10/04/2010 Document Revised: 05/25/2016 Document Reviewed: 05/25/2016 Elsevier Interactive Patient Education  2017 [employer].

## 2016-08-24 NOTE — Progress Notes (Signed)
t is here for 2 week follow up on right hand.

## 2016-09-07 ENCOUNTER — Encounter (INDEPENDENT_AMBULATORY_CARE_PROVIDER_SITE_OTHER): Payer: Self-pay

## 2016-09-07 ENCOUNTER — Ambulatory Visit: Payer: BLUE CROSS/BLUE SHIELD | Admitting: Family Medicine

## 2016-09-07 ENCOUNTER — Ambulatory Visit (INDEPENDENT_AMBULATORY_CARE_PROVIDER_SITE_OTHER): Payer: BLUE CROSS/BLUE SHIELD | Admitting: Family Medicine

## 2016-09-07 ENCOUNTER — Ambulatory Visit (INDEPENDENT_AMBULATORY_CARE_PROVIDER_SITE_OTHER): Payer: BLUE CROSS/BLUE SHIELD

## 2016-09-07 ENCOUNTER — Encounter: Payer: Self-pay | Admitting: Family Medicine

## 2016-09-07 VITALS — BP 170/75 | HR 69 | Wt 183.0 lb

## 2016-09-07 DIAGNOSIS — S62622D Displaced fracture of medial phalanx of right middle finger, subsequent encounter for fracture with routine healing: Secondary | ICD-10-CM | POA: Diagnosis not present

## 2016-09-07 DIAGNOSIS — S62642A Nondisplaced fracture of proximal phalanx of right middle finger, initial encounter for closed fracture: Secondary | ICD-10-CM | POA: Diagnosis not present

## 2016-09-07 DIAGNOSIS — X58XXXD Exposure to other specified factors, subsequent encounter: Secondary | ICD-10-CM | POA: Diagnosis not present

## 2016-09-07 NOTE — Progress Notes (Signed)
Marissa Willis is a 61 y.o. female who presents to [place] today for follow-up right hand fracture. Patient is been seen several times for fracture of the right third digit at the MCP. This resulted in a rotational deformity. She declined referral to hand surgery is electing for outpatient nonsurgical management. She understands that she may be left with a permanent or rotational deformity and scissoring of her hand. She notes in the cast she's done well. She denies any pain.   Past Medical History:  Diagnosis Date  . Asthma with COPD ([place])   . Complex partial seizure disorder [facility]) neurologist-  dr Krista Blue   dx 2014  after x2 seiaures march and sept 2014  -- on keppra  . 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 History of kidney stones   . History of partial seizures    x2 seizure in 2014  dx complex parital seizures started on keppra-- per pt no seizures since off keppra  . OSA on CPAP   . Retained ureteral stent    right side migrated  . SUI (stress urinary incontinence, female)    mild   Past Surgical History:  Procedure Laterality Date  . ANAL FISSURE REPAIR  1994  . BLADDER SUSPENSION  1991  . 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 CYSTOSCOPY WITH URETEROSCOPY AND STENT PLACEMENT Right 12/09/2015   Procedure: CYSTOSCOPY WITH retrograde AND STENT PLACEMENT;  Surgeon: Franchot Gallo, MD;  [facility];  Service: Urology;  Laterality: Right;  . CYSTOSCOPY WITH URETEROSCOPY AND STENT PLACEMENT Right 02/23/2016   Procedure: CYSTOSCOPY  AND STENT EXTRACTION;  Surgeon: Franchot Gallo, MD;  Location: [facility];  Service: Urology;  Laterality: Right;  . IR GENERIC HISTORICAL  01/12/2016   IR URETERAL STENT RIGHT NEW ACCESS W/O SEP NEPHROSTOMY CATH 01/12/2016 Corrie Mckusick, DO [facility]  .  NEPHROLITHOTOMY Right 01/12/2016   Procedure: RIGHT  PERCUTANEOUS NEPHROLITHOTOMY  insertion double j stent flexible cystoscopy  removable right double j stent;  Surgeon: Franchot Gallo, MD;  [facility];  Service: Urology;  Laterality: Right;  . TUBAL LIGATION  1991   Social History  Substance Use Topics  . Smoking status: Former Smoker    Packs/day: 1.50    Years: 45.00    Types: Cigarettes    Start date: 07/20/2013    Quit date: 01/09/2014  . Smokeless tobacco: Never Used  . Alcohol use 0.0 oz/week     Comment: 1-2 drinks daily     ROS:  As above   Medications: Current Outpatient Prescriptions  Medication Sig Dispense Refill  . Albuterol Sulfate (PROAIR RESPICLICK) 756 (90 Base) MCG/ACT AEPB Inhale 2 puffs into the lungs every 6 (six) hours as needed. (Patient taking differently: Inhale 2 puffs into the lungs every 6 (six) hours as needed (shortness of breath/wheezing). ) 2 each 2  . aspirin 81 MG tablet Take 81 mg by mouth daily.    [name] docusate sodium (COLACE) 100 MG capsule Take 100 mg by mouth 2 (two) times daily as needed for mild constipation.    . Fluticasone Furoate-Vilanterol (BREO ELLIPTA) 100-25 MCG/INH AEPB Inhale 1 puff into the lungs daily. (Patient taking differently: Inhale 1 puff into the lungs every evening. ) 1 each 11  . hydrochlorothiazide (HYDRODIURIL) 12.5 MG tablet Take 2 tablets (25  mg total) by mouth daily. As needed for lower leg swelling. (Patient taking differently: Take 25 mg by mouth daily as needed (swelling). As needed for lower leg swelling.) 30 tablet 1  . solifenacin (VESICARE) 10 MG tablet Take 10 mg by mouth daily.     No current facility-administered medications for this visit.    No Known Allergies   Exam:  BP (!) 170/75   Pulse 69   Wt 183 lb (83 kg)   BMI 31.41 kg/m  General: Well Developed, well nourished, and in no acute distress.  Neuro/Psych: Alert and oriented x3, extra-ocular muscles intact, able to move all 4  extremities, sensation grossly intact. Skin: Warm and dry, no rashes noted.  Respiratory: Not using accessory muscles, speaking in full sentences, trachea midline.  Cardiovascular: Pulses palpable, no extremity edema. Abdomen: Does not appear distended. MSK: Right hand well-appearing third MCP nontender. Motion is decreased. Scissoring is present    No results found for this or any previous visit (from the past 48 hour(s)). Dg Finger Middle Right  Result Date: 09/07/2016 CLINICAL DATA:  Recent fracture EXAM: RIGHT THIRD FINGER 2+V COMPARISON:  Right hand August 10, 2016 FINDINGS: Frontal, oblique, and lateral views obtained. The nondisplaced fracture of the proximal aspect of the third proximal phalanx appears in anatomic alignment. There is slight sclerosis in this area which may indicate early healing response. There is soft tissue swelling of the third digit. No new fracture. No dislocation. Joint spaces appear normal. No erosive change. IMPRESSION: Nondisplaced fracture proximal aspect third proximal phalanx with apparent early healing response. No new fracture. No dislocation. No apparent arthropathy. Electronically Signed   By: Lowella Grip III M.D.   On: 09/07/2016 11:57      Assessment and Plan: 61 y.o. female with healing fracture. Plan to transition to buddy tape and dorsal aluminum splint. Recheck in 3 weeks. If patient's pain  worsens will recast    Orders Placed This Encounter  Procedures  . DG Finger Middle Right    Order Specific Question:   Reason for exam:    Answer:   eval fx at mcp    Order Specific Question:   Preferred imaging location?    Answer:   [facility]    Discussed warning signs or symptoms. Please see discharge instructions. Patient expresses understanding.

## 2016-09-07 NOTE — Patient Instructions (Signed)
Thank you for coming in today. Continue the buddy tape.  Use the foam splint.  Recheck in 3 weeks.  Return sooner if the pain returns.   Avoid heavy duty activity with this splint.  It is not strong.

## 2016-09-28 ENCOUNTER — Ambulatory Visit (INDEPENDENT_AMBULATORY_CARE_PROVIDER_SITE_OTHER): Payer: BLUE CROSS/BLUE SHIELD | Admitting: Family Medicine

## 2016-09-28 ENCOUNTER — Ambulatory Visit (INDEPENDENT_AMBULATORY_CARE_PROVIDER_SITE_OTHER): Payer: BLUE CROSS/BLUE SHIELD

## 2016-09-28 ENCOUNTER — Encounter: Payer: Self-pay | Admitting: Family Medicine

## 2016-09-28 VITALS — BP 131/77 | HR 84 | Wt 182.0 lb

## 2016-09-28 DIAGNOSIS — S62612D Displaced fracture of proximal phalanx of right middle finger, subsequent encounter for fracture with routine healing: Secondary | ICD-10-CM | POA: Diagnosis not present

## 2016-09-28 DIAGNOSIS — S62642A Nondisplaced fracture of proximal phalanx of right middle finger, initial encounter for closed fracture: Secondary | ICD-10-CM | POA: Diagnosis not present

## 2016-09-28 DIAGNOSIS — X501XXD Overexertion from prolonged static or awkward postures, subsequent encounter: Secondary | ICD-10-CM

## 2016-09-28 NOTE — Patient Instructions (Signed)
Thank you for coming in today. Get xray today.  Continue to buddy tape with heavy duty hand activities for a few more weeks.  I will let you know if we need a recheck after xray.  Return as needed.

## 2016-09-28 NOTE — Progress Notes (Signed)
Marissa Willis is a 61 y.o. female who presents to [facility] today for follow-up hand fracture. Patient has been seen several times for fracture at the third MCP of the right hand. This resulted in scissoring. She refused referral to hand surgery electing for conservative management. She notes that she is essentially pain free without buddy tape for mobilization at this point. She feels great. She notes continued scissoring however.   Past Medical History:  Diagnosis Date  . Asthma with COPD ([place])   . Complex partial seizure disorder [facility]) neurologist-  dr Krista Blue   dx 2014  after x2 seiaures march and sept 2014  -- on keppra  . 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 History of kidney stones   . History of partial seizures    x2 seizure in 2014  dx complex parital seizures started on keppra-- per pt no seizures since off keppra  . OSA on CPAP   . Retained ureteral stent    right side migrated  . SUI (stress urinary incontinence, female)    mild   Past Surgical History:  Procedure Laterality Date  . ANAL FISSURE REPAIR  1994  . BLADDER SUSPENSION  1991  . 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 CYSTOSCOPY WITH URETEROSCOPY AND STENT PLACEMENT Right 12/09/2015   Procedure: CYSTOSCOPY WITH retrograde AND STENT PLACEMENT;  Surgeon: Franchot Gallo, MD;  [facility];  Service: Urology;  Laterality: Right;  . CYSTOSCOPY WITH URETEROSCOPY AND STENT PLACEMENT Right 02/23/2016   Procedure: CYSTOSCOPY  AND STENT EXTRACTION;  Surgeon: Franchot Gallo, MD;  Location: [facility];  Service: Urology;  Laterality: Right;  . IR GENERIC HISTORICAL  01/12/2016   IR URETERAL STENT RIGHT NEW ACCESS W/O SEP NEPHROSTOMY CATH 01/12/2016 Corrie Mckusick, DO [facility]  . NEPHROLITHOTOMY Right 01/12/2016   Procedure:  RIGHT  PERCUTANEOUS NEPHROLITHOTOMY  insertion double j stent flexible cystoscopy  removable right double j stent;  Surgeon: Franchot Gallo, MD;  [facility];  Service: Urology;  Laterality: Right;  . TUBAL LIGATION  1991   Social History  Substance Use Topics  . Smoking status: Former Smoker    Packs/day: 1.50    Years: 45.00    Types: Cigarettes    Start date: 07/20/2013    Quit date: 01/09/2014  . Smokeless tobacco: Never Used  . Alcohol use 0.0 oz/week     Comment: 1-2 drinks daily     ROS:  As above   Medications: Current Outpatient Prescriptions  Medication Sig Dispense Refill  . Albuterol Sulfate (PROAIR RESPICLICK) 371 (90 Base) MCG/ACT AEPB Inhale 2 puffs into the lungs every 6 (six) hours as needed. (Patient taking differently: Inhale 2 puffs into the lungs every 6 (six) hours as needed (shortness of breath/wheezing). ) 2 each 2  . aspirin 81 MG tablet Take 81 mg by mouth daily.    [name] docusate sodium (COLACE) 100 MG capsule Take 100 mg by mouth 2 (two) times daily as needed for mild constipation.    . Fluticasone Furoate-Vilanterol (BREO ELLIPTA) 100-25 MCG/INH AEPB Inhale 1 puff into the lungs daily. (Patient taking differently: Inhale 1 puff into the lungs every evening. ) 1 each 11  . hydrochlorothiazide (HYDRODIURIL) 12.5 MG tablet Take 2 tablets (25 mg total) by mouth daily. As needed for lower leg swelling. (  Patient taking differently: Take 25 mg by mouth daily as needed (swelling). As needed for lower leg swelling.) 30 tablet 1  . solifenacin (VESICARE) 10 MG tablet Take 10 mg by mouth daily.     No current facility-administered medications for this visit.    No Known Allergies   Exam:  BP 131/77   Pulse 84   Wt 182 lb (82.6 kg)   BMI 31.24 kg/m  General: Well Developed, well nourished, and in no acute distress.  Neuro/Psych: Alert and oriented x3, extra-ocular muscles intact, able to move all 4 extremities, sensation grossly intact. Skin: Warm and  dry, no rashes noted.  Respiratory: Not using accessory muscles, speaking in full sentences, trachea midline.  Cardiovascular: Pulses palpable, no extremity edema. Abdomen: Does not appear distended. MSK: Right hand slightly swollen third MCP. Scissoring present with hand flexion. Nontender stable exam. Pulses capillary refill and sensation intact.  X-ray shows good healing of third MCP injury. Awaiting formal radiology review  No results found for this or any previous visit (from the past 48 hour(s)). No results found.    Assessment and Plan: 61 y.o. female with right hand fracture doing well. X-ray is unremarkable. Continue buddy taping with heavy duty hand activities. Return as needed.    Orders Placed This Encounter  Procedures  . DG Finger Middle Right    Order Specific Question:   Reason for exam:    Answer:   eval fx    Order Specific Question:   Preferred imaging location?    Answer:   [facility]    Discussed warning signs or symptoms. Please see discharge instructions. Patient expresses understanding.

## 2016-11-06 ENCOUNTER — Encounter: Payer: Self-pay | Admitting: Physician Assistant

## 2016-11-06 ENCOUNTER — Ambulatory Visit (INDEPENDENT_AMBULATORY_CARE_PROVIDER_SITE_OTHER): Payer: BLUE CROSS/BLUE SHIELD | Admitting: Physician Assistant

## 2016-11-06 VITALS — BP 129/68 | HR 78 | Ht 64.0 in | Wt 176.0 lb

## 2016-11-06 DIAGNOSIS — J42 Unspecified chronic bronchitis: Secondary | ICD-10-CM

## 2016-11-06 DIAGNOSIS — J441 Chronic obstructive pulmonary disease with (acute) exacerbation: Secondary | ICD-10-CM

## 2016-11-06 MED ORDER — BUDESONIDE-FORMOTEROL FUMARATE 160-4.5 MCG/ACT IN AERO
2.0000 | INHALATION_SPRAY | Freq: Two times a day (BID) | RESPIRATORY_TRACT | 5 refills | Status: DC
Start: 2016-11-06 — End: 2017-06-24

## 2016-11-06 MED ORDER — IPRATROPIUM-ALBUTEROL 0.5-2.5 (3) MG/3ML IN SOLN
3.0000 mL | Freq: Once | RESPIRATORY_TRACT | Status: AC
  Administered 2016-11-06: 3 mL via RESPIRATORY_TRACT

## 2016-11-06 MED ORDER — ALBUTEROL SULFATE 108 (90 BASE) MCG/ACT IN AEPB: 2.0000 | INHALATION_SPRAY | Freq: Four times a day (QID) | RESPIRATORY_TRACT | 5 refills | Status: DC | PRN

## 2016-11-06 MED ORDER — AZITHROMYCIN 250 MG PO TABS: ORAL_TABLET | ORAL | 0 refills | Status: DC

## 2016-11-06 MED ORDER — PREDNISONE 20 MG PO TABS: ORAL_TABLET | ORAL | 0 refills | Status: DC

## 2016-11-06 MED FILL — predniSONE 20 MG TABS: 20 | 13 days supply | Qty: 20 | Fill #0

## 2016-11-06 MED FILL — AZITHROMYCIN 250 MG TABLET: 250 | 5 days supply | Qty: 6 | Fill #0

## 2016-11-06 MED FILL — SYMBICORT 160-4.5 MCG INH: 160-4.5 | 30 days supply | Qty: 10 | Fill #0

## 2016-11-06 MED FILL — PROAIR RESPICLICK INHAL PWD: 108 (90 BAS | 25 days supply | Qty: 1 | Fill #0

## 2016-11-06 NOTE — Progress Notes (Signed)
Subjective:    Patient ID: Marissa Willis, female    DOB: 1956-03-01, 61 y.o.   MRN: [id number]  HPI Pt is a 61 yo female with COPD and asthma who presents to the clinic with sinus pressure, cough, wheezing, SOB, chest tightness for last week. She has only been using albuterol 4-5 times a day and taking mucinex with little relief. . She stopped BREO because she did not feel like was working. She is not currently smoking. No fever, chills, body aches.   .. Active Ambulatory Problems    Diagnosis Date Noted  . COPD (chronic obstructive pulmonary disease) ([place]) 12/26/2012  . Asthma 12/26/2012  . Complex partial seizure ([place]) 03/02/2013  . Former smoker 12/09/2013  . Hyperlipidemia 12/09/2013  . Aortic atherosclerosis ([place]) 12/28/2014  . Bilateral lower extremity edema 07/06/2015  . Renal calculi 12/09/2015  . Kidney calculi 01/12/2016  . Finger fracture, right 08/24/2016   Resolved Ambulatory Problems    Diagnosis Date Noted  . Hand injury, right, initial encounter 08/10/2016   Past Medical History:  Diagnosis Date  . Asthma with COPD ([place])   . Complex partial seizure disorder [facility]) neurologist-  dr Krista Blue  . History of cerebral aneurysm repair   . History of kidney stones   . History of partial seizures   . OSA on CPAP   . Retained ureteral stent   . SUI (stress urinary incontinence, female)       Review of Systems  All other systems reviewed and are negative.      Objective:   Physical Exam  Constitutional: She is oriented to person, place, and time. She appears well-developed and well-nourished.  HENT:  Head: Normocephalic and atraumatic.  Right Ear: External ear normal.  Left Ear: External ear normal.  Nose: Nose normal.  Mouth/Throat: Oropharynx is clear and moist. No oropharyngeal exudate.  Eyes: Conjunctivae are normal. Right eye exhibits no discharge. Left eye exhibits no discharge.  Neck: Normal range of motion. Neck supple.  Cardiovascular: Normal rate,  regular rhythm and normal heart sounds.   Pulmonary/Chest:  Decreased effort.  Wheezing and rhonchi over bilateral lungs. No crackles.   Lymphadenopathy:    She has no cervical adenopathy.  Neurological: She is alert and oriented to person, place, and time.  Psychiatric: She has a normal mood and affect. Her behavior is normal.          Assessment & Plan:  [name] KitchenMarland KitchenDiagnoses and all orders for this visit:  COPD exacerbation ([place]) -     azithromycin (ZITHROMAX) 250 MG tablet; Take 2 tablets now and then one tablet for 4 days. -     predniSONE (DELTASONE) 20 MG tablet; Take 3 tablets for 3 days, then 2 tablets for 3 days, then take 1 tablet for 3 days, take 1/2 tablet for 4 days. -     budesonide-formoterol (SYMBICORT) 160-4.5 MCG/ACT inhaler; Inhale 2 puffs into the lungs 2 (two) times daily. -     ipratropium-albuterol (DUONEB) 0.5-2.5 (3) MG/3ML nebulizer solution 3 mL; Take 3 mLs by nebulization once.  Chronic bronchitis, unspecified chronic bronchitis type (H[facility]) -     budesonide-formoterol (SYMBICORT) 160-4.5 MCG/ACT inhaler; Inhale 2 puffs into the lungs 2 (two) times daily.  Other orders -     Albuterol Sulfate (PROAIR RESPICLICK) 017 (90 Base) MCG/ACT AEPB; Inhale 2 puffs into the lungs every 6 (six) hours as needed (shortness of breath/wheezing).   Symptomatic care discussed. I want her to try symbicort for prevention to replace BREO. Follow  up in 1 month. duoneb given in office. Continue with albuterol inhaler as needed.

## 2017-01-17 MED FILL — SYMBICORT 160-4.5 MCG INH: 160-4.5 | 30 days supply | Qty: 10 | Fill #1

## 2017-02-14 ENCOUNTER — Encounter: Payer: Self-pay | Admitting: Physician Assistant

## 2017-02-14 ENCOUNTER — Emergency Department
Admission: EM | Admit: 2017-02-14 | Discharge: 2017-02-14 | Disposition: A | Discharge status: Home / Self Care | Payer: BLUE CROSS/BLUE SHIELD | Source: Home / Self Care

## 2017-02-14 DIAGNOSIS — R0981 Nasal congestion: Secondary | ICD-10-CM | POA: Diagnosis not present

## 2017-02-14 DIAGNOSIS — H6981 Other specified disorders of Eustachian tube, right ear: Secondary | ICD-10-CM

## 2017-02-14 MED ORDER — FLUTICASONE PROPIONATE 50 MCG/ACT NA SUSP: 2.0000 | Freq: Every day | NASAL | 0 refills | Status: DC

## 2017-02-14 NOTE — ED Triage Notes (Signed)
Vertigo, right ear feels like it has fluid, dizziness, slight nausea since this morning

## 2017-02-14 NOTE — Discharge Instructions (Signed)
Start flonase for nasal congestion/eustachian tube dysfunction. You can use over the counter nasal saline rinse such as neti pot for nasal congestion. Over the counter Meclizine for dizziness. Keep hydrated, your urine should be clear to pale yellow in color.  Monitor for worsening of symptoms, dizziness not resolving, weakness, feeling faint, trouble breathing, shortness of breath, follow-up for reevaluation.

## 2017-02-14 NOTE — ED Provider Notes (Signed)
[name]    CSN: [id number] Arrival date & time: 02/14/17  1424     History   Chief Complaint Chief Complaint  Patient presents with  . Ear Problem    HPI Marissa Willis is a 61 y.o. female.   61 year old female with history of COPD, OSA, kidney stones, comes in for four-day history of "water in the ear". Patient states she felt water in the ear, has been trying to jump and have to water come out. This morning, she experienced dizziness, especially during movement. She tried hydrogen peroxide in the ear, which did not improve symptoms. She denies fever, chills, night sweats. Denies allergies, cough, congestion, sore throat. Denies current dizziness or weakness. Denies chest pain, shortness of breath, palpitations.      Past Medical History:  Diagnosis Date  . Asthma with COPD ([place])   . Complex partial seizure disorder [facility]) neurologist-  dr Krista Blue   dx 2014  after x2 seiaures march and sept 2014  -- on keppra  . 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 History of kidney stones   . History of partial seizures    x2 seizure in 2014  dx complex parital seizures started on keppra-- per pt no seizures since off keppra  . OSA on CPAP   . Retained ureteral stent    right side migrated  . SUI (stress urinary incontinence, female)    mild    Patient Active Problem List   Diagnosis Date Noted  . Finger fracture, right 08/24/2016  . Kidney calculi 01/12/2016  . Renal calculi 12/09/2015  . Bilateral lower extremity edema 07/06/2015  . Aortic atherosclerosis ([place]) 12/28/2014  . Former smoker 12/09/2013  . Hyperlipidemia 12/09/2013  . Complex partial seizure ([place]) 03/02/2013  . COPD (chronic obstructive pulmonary disease) ([place]) 12/26/2012  . Asthma 12/26/2012    Past Surgical History:  Procedure Laterality Date  . ANAL FISSURE REPAIR  1994  . BLADDER SUSPENSION  1991  . 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 CYSTOSCOPY WITH URETEROSCOPY AND STENT PLACEMENT Right 12/09/2015   Procedure: CYSTOSCOPY WITH retrograde AND STENT PLACEMENT;  Surgeon: Franchot Gallo, MD;  Location: [facility];  Service: Urology;  Laterality: Right;  . CYSTOSCOPY WITH URETEROSCOPY AND STENT PLACEMENT Right 02/23/2016   Procedure: CYSTOSCOPY  AND STENT EXTRACTION;  Surgeon: Franchot Gallo, MD;  [facility];  Service: Urology;  Laterality: Right;  . IR GENERIC HISTORICAL  01/12/2016   IR URETERAL STENT RIGHT NEW ACCESS W/O SEP NEPHROSTOMY CATH 01/12/2016 Corrie Mckusick, DO [facility]-INTER[facility] RAD  . NEPHROLITHOTOMY Right 01/12/2016   Procedure: RIGHT  PERCUTANEOUS NEPHROLITHOTOMY  insertion double j stent flexible cystoscopy  removable right double j stent;  Surgeon: Franchot Gallo, MD;  Location: [facility];  Service: Urology;  Laterality: Right;  . TUBAL LIGATION  1991    OB History    No data available       Home Medications    Prior to Admission medications   Medication Sig Start Date End Date Taking? Authorizing Provider  Albuterol Sulfate (PROAIR RESPICLICK) 751 (90 Base) MCG/ACT AEPB Inhale 2 puffs into the lungs every 6 (six) hours as needed (shortness of breath/wheezing). 11/06/16   Breeback, Jade L, PA-C  budesonide-formoterol (SYMBICORT) 160-4.5 MCG/ACT inhaler Inhale 2 puffs into the lungs 2 (two) times daily. 11/06/16   Breeback,  Jade L, PA-C  fluticasone (FLONASE) 50 MCG/ACT nasal spray Place 2 sprays into both nostrils daily. 02/14/17   Ok Edwards, PA-C    Family History Family History  Problem Relation Age of Onset  . Diabetes Mother   . Heart disease Mother   . Hypertension Mother   . Heart disease Father     Social History Social History  Substance Use Topics  . Smoking status: Former Smoker    Packs/day: 1.50    Years: 45.00    Types: Cigarettes    Start date: 07/20/2013    Quit date: 01/09/2014  . Smokeless  tobacco: Never Used  . Alcohol use 0.0 oz/week     Comment: 1-2 drinks daily     Allergies   Patient has no known allergies.   Review of Systems Review of Systems  Reason unable to perform ROS: See HPI as above.     Physical Exam Triage Vital Signs ED Triage Vitals  Enc Vitals Group     BP 02/14/17 1442 129/75     Pulse Rate 02/14/17 1442 67     Resp --      Temp 02/14/17 1442 98.2 F (36.8 C)     Temp Source 02/14/17 1442 Oral     SpO2 02/14/17 1442 96 %     Weight 02/14/17 1444 180 lb (81.6 kg)     Height 02/14/17 1444 5\' 4"  (1.626 m)     Head Circumference --      Peak Flow --      Pain Score 02/14/17 1444 0     Pain Loc --      Pain Edu? --      Excl. in [place]? --    No data found.   Updated Vital Signs BP 129/75 (BP Location: Left Arm)   Pulse 67   Temp 98.2 F (36.8 C) (Oral)   Ht 5\' 4"  (1.626 m)   Wt 180 lb (81.6 kg)   SpO2 96%   BMI 30.90 kg/m   Physical Exam  Constitutional: She is oriented to person, place, and time. She appears well-developed and well-nourished. No distress.  HENT:  Head: Normocephalic and atraumatic.  Right Ear: External ear and ear canal normal. Tympanic membrane is not erythematous and not bulging. A middle ear effusion is present.  Left Ear: Tympanic membrane, external ear and ear canal normal. Tympanic membrane is not erythematous and not bulging.  No middle ear effusion.  Nose: Mucosal edema present. Right sinus exhibits no maxillary sinus tenderness and no frontal sinus tenderness. Left sinus exhibits no maxillary sinus tenderness and no frontal sinus tenderness.  Mouth/Throat: Uvula is midline, oropharynx is clear and moist and mucous membranes are normal.  Eyes: Pupils are equal, round, and reactive to light. Conjunctivae are normal.  Neck: Normal range of motion. Neck supple.  Cardiovascular: Normal rate, regular rhythm and normal heart sounds.  Exam reveals no gallop and no friction rub.   No murmur  heard. Pulmonary/Chest: Effort normal. She has no decreased breath sounds. She has wheezes (mild scattered expiratory wheezes). She has no rhonchi. She has no rales.  Lymphadenopathy:    She has no cervical adenopathy.  Neurological: She is alert and oriented to person, place, and time. She is not disoriented. Coordination and gait normal.  Skin: Skin is warm and dry.     UC Treatments / Results  Labs (all labs ordered are listed, but only abnormal results are displayed) Labs Reviewed - No data to display  EKG  EKG Interpretation None       Radiology No results found.  Procedures Procedures (including critical care time)  Medications Ordered in UC Medications - No data to display   Initial Impression / Assessment and Plan / UC Course  I have reviewed the triage vital signs and the nursing notes.  Pertinent labs & imaging results that were available during my care of the patient were reviewed by me and considered in my medical decision making (see chart for details).    Discussed with patient, right ear canal without foreign body, water, infection. Discussed symptoms most likely due to nasal congestion and eustachian tube dysfunction given exam. Discussed lung exam with patient, who states due to COPD, she has mild wheezing on baseline. She again denied shortness of breath, trouble breathing. Symptomatic treatment provided. Information on diagnosis given. Return precautions given.  Final Clinical Impressions(s) / UC Diagnoses   Final diagnoses:  Nasal congestion  Dysfunction of right eustachian tube    New Prescriptions New Prescriptions   FLUTICASONE (FLONASE) 50 MCG/ACT NASAL SPRAY    Place 2 sprays into both nostrils daily.      Ok Edwards, PA-C 02/14/17 [place], Khizar Fiorella V, PA-C 02/14/17 [phone]

## 2017-02-19 MED FILL — SYMBICORT 160-4.5 MCG INH: 160-4.5 | 30 days supply | Qty: 10 | Fill #2

## 2017-03-22 MED FILL — SYMBICORT 160-4.5 MCG INH: 160-4.5 | 30 days supply | Qty: 10 | Fill #3

## 2017-03-22 MED FILL — PROAIR RESPICLICK INHAL PWD: 108 (90 BAS | 25 days supply | Qty: 1 | Fill #1

## 2017-04-19 MED FILL — SYMBICORT 160-4.5 MCG INH: 160-4.5 | 30 days supply | Qty: 10 | Fill #4

## 2017-05-05 ENCOUNTER — Emergency Department
Admission: EM | Admit: 2017-05-05 | Discharge: 2017-05-05 | Disposition: A | Discharge status: Home / Self Care | Payer: BLUE CROSS/BLUE SHIELD | Source: Home / Self Care | Attending: Family Medicine | Admitting: Family Medicine

## 2017-05-05 ENCOUNTER — Encounter: Payer: Self-pay | Admitting: Emergency Medicine

## 2017-05-05 DIAGNOSIS — J209 Acute bronchitis, unspecified: Secondary | ICD-10-CM

## 2017-05-05 MED ORDER — METHYLPREDNISOLONE SODIUM SUCC 40 MG IJ SOLR
80.0000 mg | Freq: Once | INTRAMUSCULAR | Status: AC
  Administered 2017-05-05: 80 mg via INTRAMUSCULAR

## 2017-05-05 MED ORDER — BENZONATATE 100 MG PO CAPS: 100.0000 mg | ORAL_CAPSULE | Freq: Three times a day (TID) | ORAL | 0 refills | Status: DC

## 2017-05-05 MED ORDER — AZITHROMYCIN 250 MG PO TABS
250.0000 mg | ORAL_TABLET | Freq: Every day | ORAL | 0 refills | Status: DC
Start: 2017-05-05 — End: 2017-09-11

## 2017-05-05 MED ORDER — IPRATROPIUM-ALBUTEROL 0.5-2.5 (3) MG/3ML IN SOLN
3.0000 mL | Freq: Once | RESPIRATORY_TRACT | Status: AC
  Administered 2017-05-05: 3 mL via RESPIRATORY_TRACT

## 2017-05-05 MED ORDER — PREDNISONE 20 MG PO TABS: ORAL_TABLET | ORAL | 0 refills | Status: DC

## 2017-05-05 NOTE — ED Triage Notes (Signed)
Patient complaining of head cold x 1 week which has moved into her chest x 1 day, productive cough, runny nose, bilateral ear pain, history of asthma.

## 2017-05-05 NOTE — ED Provider Notes (Signed)
[facility]    CSN: [id number] Arrival date & time: 05/05/17  1229     History   Chief Complaint Chief Complaint  Patient presents with  . Cough    HPI Marissa Willis is a 61 y.o. female.   HPI  Marissa Willis is a 61 y.o. female presenting to UC with c/o 1 week of gradually worsening productive cough that started in her sinuses but has since moved to her chest within the last 24 hours.  Associated rhinorrhea and bilateral ear pain. Hx of asthma and bronchitis. She has been using her inhaler w/o relief. 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 Denies n/v/d.    Past Medical History:  Diagnosis Date  . Asthma with COPD ([place])   . Complex partial seizure disorder [facility]) neurologist-  dr Krista Blue   dx 2014  after x2 seiaures march and sept 2014  -- on keppra  . 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 History of kidney stones   . History of partial seizures    x2 seizure in 2014  dx complex parital seizures started on keppra-- per pt no seizures since off keppra  . OSA on CPAP   . Retained ureteral stent    right side migrated  . SUI (stress urinary incontinence, female)    mild    Patient Active Problem List   Diagnosis Date Noted  . Finger fracture, right 08/24/2016  . Kidney calculi 01/12/2016  . Renal calculi 12/09/2015  . Bilateral lower extremity edema 07/06/2015  . Aortic atherosclerosis ([place]) 12/28/2014  . Former smoker 12/09/2013  . Hyperlipidemia 12/09/2013  . Complex partial seizure ([place]) 03/02/2013  . COPD (chronic obstructive pulmonary disease) ([place]) 12/26/2012  . Asthma 12/26/2012    Past Surgical History:  Procedure Laterality Date  . ANAL FISSURE REPAIR  1994  . BLADDER SUSPENSION  1991  . 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  CYSTOSCOPY  AND STENT EXTRACTION Right 02/23/2016   Performed by Franchot Gallo, [facility] at [facility]  . CYSTOSCOPY WITH retrograde AND STENT PLACEMENT Right 12/09/2015   Performed by Franchot Gallo, MD at [facility]  . IR GENERIC HISTORICAL  01/12/2016   IR URETERAL STENT RIGHT NEW ACCESS W/O SEP NEPHROSTOMY CATH 01/12/2016 Corrie Mckusick, DO [facility]-INTER[facility] RAD  . RIGHT  PERCUTANEOUS NEPHROLITHOTOMY  insertion double j stent flexible cystoscopy  removable right double j stent Right 01/12/2016   Performed by Franchot Gallo, MD at [facility] ORS  . TUBAL LIGATION  1991    OB History    No data available       Home Medications    Prior to Admission medications   Medication Sig Start Date End Date Taking? Authorizing Provider  aspirin 81 MG chewable tablet Chew daily by mouth.   Yes [provider]  Albuterol Sulfate (PROAIR RESPICLICK) 409 (90 Base) MCG/ACT AEPB Inhale 2 puffs into the lungs every 6 (six) hours as needed (shortness of breath/wheezing). 11/06/16   Breeback, Jade L, PA-C  azithromycin (ZITHROMAX) 250 MG tablet Take 1 tablet (250 mg total) daily by mouth. Take first 2 tablets together, then 1 every day until finished. 05/05/17   Noe Gens, PA-C  benzonatate (TESSALON) 100 MG capsule Take 1-2 capsules (  100-200 mg total) every 8 (eight) hours by mouth. 05/05/17   Margalit Leece, Bronwen Betters, PA-C  budesonide-formoterol [facility]) 160-4.5 MCG/ACT inhaler Inhale 2 puffs into the lungs 2 (two) times daily. 11/06/16   Donella Stade, PA-C  predniSONE (DELTASONE) 20 MG tablet 3 tabs po day one, then 2 po daily x 4 days 05/05/17   Noe Gens, PA-C    Family History Family History  Problem Relation Age of Onset  . Diabetes Mother   . Heart disease Mother   . Hypertension Mother   . Heart disease Father     Social History Social History   Tobacco Use  . Smoking status: Former Smoker    Packs/day: 1.50    Years: 45.00    Pack years: 67.50    Types: Cigarettes     Start date: 07/20/2013    Last attempt to quit: 01/09/2014    Years since quitting: 3.3  . Smokeless tobacco: Never Used  Substance Use Topics  . Alcohol use: Yes    Alcohol/week: 0.0 oz    Comment: 1-2 drinks daily  . Drug use: Yes    Types: Marijuana     Allergies   Patient has no known allergies.   Review of Systems Review of Systems  Constitutional: Negative for chills and fever.  HENT: Positive for congestion, ear pain (presure bilaterally), postnasal drip, rhinorrhea and sinus pressure. Negative for sore throat, trouble swallowing and voice change.   Respiratory: Positive for cough, chest tightness, shortness of breath and wheezing.   Cardiovascular: Negative for chest pain and palpitations.  Gastrointestinal: Negative for abdominal pain, diarrhea, nausea and vomiting.  Musculoskeletal: Negative for arthralgias, back pain and myalgias.  Skin: Negative for rash.     Physical Exam Triage Vital Signs ED Triage Vitals  Enc Vitals Group     BP 05/05/17 1336 (!) 153/80     Pulse Rate 05/05/17 1336 62     Resp --      Temp 05/05/17 1336 98.3 F (36.8 C)     Temp Source 05/05/17 1336 Oral     SpO2 05/05/17 1336 92 %     Weight 05/05/17 1337 190 lb 4 oz (86.3 kg)     Height 05/05/17 1337 5\' 4"  (1.626 m)     Head Circumference --      Peak Flow --      Pain Score 05/05/17 1337 0     Pain Loc --      Pain Edu? --      Excl. in [place]? --    No data found.  Updated Vital Signs BP (!) 153/80 (BP Location: Left Arm)   Pulse 62   Temp 98.3 F (36.8 C) (Oral)   Ht 5\' 4"  (1.626 m)   Wt 190 lb 4 oz (86.3 kg)   SpO2 92%   BMI 32.66 kg/m   Visual Acuity Right Eye Distance:   Left Eye Distance:   Bilateral Distance:    Right Eye Near:   Left Eye Near:    Bilateral Near:     Physical Exam  Constitutional: She is oriented to person, place, and time. She appears well-developed and well-nourished. No distress.  HENT:  Head: Normocephalic and atraumatic.  Right Ear:  Tympanic membrane normal.  Left Ear: Tympanic membrane normal.  Nose: Nose normal.  Mouth/Throat: Uvula is midline, oropharynx is clear and moist and mucous membranes are normal.  Eyes: EOM are normal.  Neck: Normal range of motion. Neck supple.  Cardiovascular:  Normal rate and regular rhythm.  Pulmonary/Chest: Effort normal. No stridor. No respiratory distress. She has wheezes. She has rales.  Diffuse wheeze and coarse breath sounds.   Musculoskeletal: Normal range of motion.  Lymphadenopathy:    She has no cervical adenopathy.  Neurological: She is alert and oriented to person, place, and time.  Skin: Skin is warm and dry. She is not diaphoretic.  Psychiatric: She has a normal mood and affect. Her behavior is normal.  Nursing note and vitals reviewed.    UC Treatments / Results  Labs (all labs ordered are listed, but only abnormal results are displayed) Labs Reviewed - No data to display  EKG  EKG Interpretation None       Radiology No results found.  Procedures Procedures (including critical care time)  Medications Ordered in UC Medications  methylPREDNISolone sodium succinate (SOLU-MEDROL) 40 mg/mL injection 80 mg (80 mg Intramuscular Given 05/05/17 1353)  ipratropium-albuterol (DUONEB) 0.5-2.5 (3) MG/3ML nebulizer solution 3 mL (3 mLs Nebulization Given 05/05/17 1353)     Initial Impression / Assessment and Plan / UC Course  I have reviewed the triage vital signs and the nursing notes.  Pertinent labs & imaging results that were available during my care of the patient were reviewed by me and considered in my medical decision making (see chart for details).     Diffuse coarse breath sounds with wheeze. O2 Sat 92% on RA Duoneb and Solumedrol 80mg  IM given in UC Pt reports feeling mild to moderate improvement from breathing treatment Lungs: diffuse coarse breath sounds still present Will cover for underlying bacterial infection Pt feels comfortable being  discharged home. She does not need refill on her inhaler F/u with PCP in 1 week if needed Discussed symptoms that warrant emergent care in the ED.    Final Clinical Impressions(s) / UC Diagnoses   Final diagnoses:  Acute bronchitis, unspecified organism    ED Discharge Orders        Ordered    azithromycin (ZITHROMAX) 250 MG tablet  Daily     05/05/17 1417    predniSONE (DELTASONE) 20 MG tablet     05/05/17 1417    benzonatate (TESSALON) 100 MG capsule  Every 8 hours     05/05/17 1417       Controlled Substance Prescriptions [place] Controlled Substance Registry consulted? Not Applicable   Noe Gens, PA-C 05/05/17 1457

## 2017-05-05 NOTE — Discharge Instructions (Signed)
°  You may take 500mg acetaminophen every 4-6 hours or in combination with ibuprofen 400-600mg every 6-8 hours as needed for pain, inflammation, and fever. ° °Be sure to drink at least eight 8oz glasses of water to stay well hydrated and get at least 8 hours of sleep at night, preferably more while sick.  ° °

## 2017-05-22 MED FILL — SYMBICORT 160-4.5 MCG INH: 160-4.5 | 30 days supply | Qty: 10 | Fill #5

## 2017-06-24 ENCOUNTER — Other Ambulatory Visit: Payer: Self-pay | Admitting: Physician Assistant

## 2017-06-24 DIAGNOSIS — J42 Unspecified chronic bronchitis: Secondary | ICD-10-CM

## 2017-06-24 DIAGNOSIS — J441 Chronic obstructive pulmonary disease with (acute) exacerbation: Secondary | ICD-10-CM

## 2017-06-24 MED FILL — SYMBICORT 160-4.5 MCG INH: 160-4.5 | 30 days supply | Qty: 10 | Fill #0

## 2017-07-23 MED FILL — AMOXICILLIN 500 MG CAPSULE: 500 | 9 days supply | Qty: 25 | Fill #0

## 2017-07-24 MED FILL — SYMBICORT 160-4.5 MCG INH: 160-4.5 | 30 days supply | Qty: 10 | Fill #1

## 2017-08-05 MED FILL — IBUPROFEN 800 MG TAB: 800 | 3 days supply | Qty: 15 | Fill #0

## 2017-08-09 MED FILL — AMOXICILLIN 500 MG CAPSULE: 500 | 9 days supply | Qty: 25 | Fill #0

## 2017-09-11 ENCOUNTER — Ambulatory Visit (INDEPENDENT_AMBULATORY_CARE_PROVIDER_SITE_OTHER): Payer: BLUE CROSS/BLUE SHIELD | Admitting: Physician Assistant

## 2017-09-11 ENCOUNTER — Encounter: Payer: Self-pay | Admitting: Physician Assistant

## 2017-09-11 VITALS — BP 153/71 | HR 69 | Ht 64.0 in | Wt 198.0 lb

## 2017-09-11 DIAGNOSIS — R8290 Unspecified abnormal findings in urine: Secondary | ICD-10-CM

## 2017-09-11 DIAGNOSIS — Z1159 Encounter for screening for other viral diseases: Secondary | ICD-10-CM | POA: Diagnosis not present

## 2017-09-11 DIAGNOSIS — M25552 Pain in left hip: Secondary | ICD-10-CM

## 2017-09-11 DIAGNOSIS — R1011 Right upper quadrant pain: Secondary | ICD-10-CM | POA: Diagnosis not present

## 2017-09-11 DIAGNOSIS — R7301 Impaired fasting glucose: Secondary | ICD-10-CM

## 2017-09-11 DIAGNOSIS — E782 Mixed hyperlipidemia: Secondary | ICD-10-CM | POA: Diagnosis not present

## 2017-09-11 DIAGNOSIS — Z0000 Encounter for general adult medical examination without abnormal findings: Secondary | ICD-10-CM | POA: Diagnosis not present

## 2017-09-11 DIAGNOSIS — Z6835 Body mass index (BMI) 35.0-35.9, adult: Secondary | ICD-10-CM | POA: Insufficient documentation

## 2017-09-11 DIAGNOSIS — R1013 Epigastric pain: Secondary | ICD-10-CM | POA: Diagnosis not present

## 2017-09-11 DIAGNOSIS — Z6834 Body mass index (BMI) 34.0-34.9, adult: Secondary | ICD-10-CM | POA: Diagnosis not present

## 2017-09-11 DIAGNOSIS — E6609 Other obesity due to excess calories: Secondary | ICD-10-CM | POA: Diagnosis not present

## 2017-09-11 DIAGNOSIS — M25551 Pain in right hip: Secondary | ICD-10-CM | POA: Diagnosis not present

## 2017-09-11 DIAGNOSIS — Z1231 Encounter for screening mammogram for malignant neoplasm of breast: Secondary | ICD-10-CM

## 2017-09-11 MED ORDER — OMEPRAZOLE 40 MG PO CPDR: 40.0000 mg | DELAYED_RELEASE_CAPSULE | Freq: Every day | ORAL | 1 refills | Status: DC

## 2017-09-11 MED FILL — OMEPRAZOLE DR 40 MG CAPSULE: 40 | 30 days supply | Qty: 30 | Fill #0

## 2017-09-11 NOTE — Patient Instructions (Signed)
STOP ASA and NSAIDs.  Start omeprazole.  Will get u/S.   Keeping You Healthy  Get These Tests  Blood Pressure- Have your blood pressure checked by your healthcare provider at least once a year.  Normal blood pressure is 120/80.  Weight- Have your body mass index (BMI) calculated to screen for obesity.  BMI is a measure of body fat based on height and weight.  You can calculate your own BMI at [employer]  Cholesterol- Have your cholesterol checked every year.  Diabetes- Have your blood sugar checked every year if you have high blood pressure, high cholesterol, a family history of diabetes or if you are overweight.  Pap Test - Have a pap test every 1 to 5 years if you have been sexually active.  If you are older than 65 and recent pap tests have been normal you may not need additional pap tests.  In addition, if you have had a hysterectomy  for benign disease additional pap tests are not necessary.  Mammogram-Yearly mammograms are essential for early detection of breast cancer  Screening for Colon Cancer- Colonoscopy starting at age 30. Screening may begin sooner depending on your family history and other health conditions.  Follow up colonoscopy as directed by your Gastroenterologist.  Screening for Osteoporosis- Screening begins at age 68 with bone density scanning, sooner if you are at higher risk for developing Osteoporosis.  Get these medicines  Calcium with Vitamin D- Your body requires 1200-1500 mg of Calcium a day and [phone] IU of Vitamin D a day.  You can only absorb 500 mg of Calcium at a time therefore Calcium must be taken in 2 or 3 separate doses throughout the day.  Hormones- Hormone therapy has been associated with increased risk for certain cancers and heart disease.  Talk to your healthcare provider about if you need relief from menopausal symptoms.  Aspirin- Ask your healthcare provider about taking Aspirin to prevent Heart Disease and Stroke.  Get  these Immuniztions  Flu shot- Every fall  Pneumonia shot- Once after the age of 2; if you are younger ask your healthcare provider if you need a pneumonia shot.  Tetanus- Every ten years.  Zostavax- Once after the age of 58 to prevent shingles.  Take these steps  Don't smoke- Your healthcare provider can help you quit. For tips on how to quit, ask your healthcare provider or go to www.smokefree.gov or call 1-800 QUIT-NOW.  Be physically active- Exercise 5 days a week for a minimum of 30 minutes.  If you are not already physically active, start slow and gradually work up to 30 minutes of moderate physical activity.  Try walking, dancing, bike riding, swimming, etc.  Eat a healthy diet- Eat a variety of healthy foods such as fruits, vegetables, whole grains, low fat milk, low fat cheeses, yogurt, lean meats, chicken, fish, eggs, dried beans, tofu, etc.  For more information go to www.thenutritionsource.org  Dental visit- Brush and floss teeth twice daily; visit your dentist twice a year.  Eye exam- Visit your Optometrist or Ophthalmologist yearly.  Drink alcohol in moderation- Limit alcohol intake to one drink or less a day.  Never drink and drive.  Depression- Your emotional health is as important as your physical health.  If you're feeling down or losing interest in things you normally enjoy, please talk to your healthcare provider.  Seat Belts- can save your life; always wear one  Smoke/Carbon Monoxide detectors- These detectors need to be installed on the appropriate level of  your home.  Replace batteries at least once a year.  Violence- If anyone is threatening or hurting you, please tell your healthcare provider.  Living Will/ Health care power of attorney- Discuss with your healthcare provider and family.

## 2017-09-11 NOTE — Progress Notes (Signed)
Subjective:    Patient ID: Marissa Willis, female    DOB: 05/28/1956, 62 y.o.   MRN: [id number]  HPI Marissa Willis is a 62 year old female who presents today for her annual. She has no specific complaint today but would like some information regarding exercise, diet, and diabetes. She states that she started exercising last week and has been excited about that. She states that her diet has not been good because she has been eating out a lot more. She states that she used to cook but has gotten away from that because her mother recently passed away and they have more free time. She also states that she has been checking her blood sugars fasting in the morning and they have ranged from ~100-114. She started checking them because she has been gaining weight. She has also noticed a little bit of numbness/tingling sensation to her feet when she walks for extended periods of time. She is also having some intermittent bilateral hip pain. She thought it started when she was on lipitor so she stopped it. Pain improved but did not go away.  She requests a UA due to urine odor but she denies any dysuria, frequency, urgency.  .. Active Ambulatory Problems    Diagnosis Date Noted  . COPD (chronic obstructive pulmonary disease) ([place]) 12/26/2012  . Asthma 12/26/2012  . Complex partial seizure ([place]) 03/02/2013  . Former smoker 12/09/2013  . Hyperlipidemia 12/09/2013  . Aortic atherosclerosis ([place]) 12/28/2014  . Bilateral lower extremity edema 07/06/2015  . Renal calculi 12/09/2015  . Kidney calculi 01/12/2016  . Finger fracture, right 08/24/2016  . Bilateral hip pain 09/11/2017  . Epigastric pain 09/11/2017  . RUQ pain 09/11/2017  . Class 1 obesity due to excess calories without serious comorbidity with body mass index (BMI) of 34.0 to 34.9 in adult 09/11/2017   Resolved Ambulatory Problems    Diagnosis Date Noted  . Hand injury, right, initial encounter 08/10/2016   Past Medical History:  Diagnosis  Date  . Asthma with COPD ([place])   . Complex partial seizure disorder [facility]) neurologist-  dr Krista Blue  . History of cerebral aneurysm repair   . History of kidney stones   . History of partial seizures   . OSA on CPAP   . Retained ureteral stent   . SUI (stress urinary incontinence, female)       Review of Systems  Constitutional: Negative for chills, fatigue and fever.  HENT: Negative for sinus pressure and sinus pain.   Eyes: Negative for visual disturbance.  Respiratory: Negative for cough, chest tightness and shortness of breath.   Cardiovascular: Positive for leg swelling. Negative for chest pain and palpitations.  Gastrointestinal: Positive for abdominal distention. Negative for constipation, diarrhea, nausea and vomiting.  Genitourinary: Negative for dysuria.  Neurological: Positive for numbness.       Objective:   Physical Exam  Constitutional: She is oriented to person, place, and time. She appears well-developed and well-nourished. No distress.  HENT:  Head: Normocephalic and atraumatic.  Right Ear: External ear normal.  Left Ear: External ear normal.  Mouth/Throat: Oropharynx is clear and moist. No oropharyngeal exudate.  Eyes: Conjunctivae are normal. Right eye exhibits no discharge. Left eye exhibits no discharge.  Neck: Normal range of motion. Neck supple. No thyromegaly present.  Cardiovascular: Normal rate, regular rhythm and normal heart sounds.  No murmur heard. Pulmonary/Chest: Effort normal and breath sounds normal. No respiratory distress.  Abdominal: She exhibits distension. There is tenderness in the right  upper quadrant.  Lymphadenopathy:    She has no cervical adenopathy.  Neurological: She is alert and oriented to person, place, and time.  Skin: No rash noted.  Psychiatric: She has a normal mood and affect. Her behavior is normal.  Vitals reviewed.         Assessment & Plan:  [name] KitchenMarland KitchenLaronda was seen today for annual exam and obesity.  Diagnoses and  all orders for this visit:  Routine physical examination -     Lipid Panel w/reflex Direct LDL -     COMPLETE METABOLIC PANEL WITH GFR -     Hemoglobin A1c -     MM SCREENING BREAST TOMO BILATERAL -     CBC  Elevated fasting glucose -     COMPLETE METABOLIC PANEL WITH GFR -     Hemoglobin A1c  Visit for screening mammogram -     MM SCREENING BREAST TOMO BILATERAL  Mixed hyperlipidemia -     Lipid Panel w/reflex Direct LDL  Need for hepatitis C screening test -     Hepatitis C Antibody  Bilateral hip pain  Epigastric pain -     Lipase -     omeprazole (PRILOSEC) 40 MG capsule; Take 1 capsule (40 mg total) by mouth daily. -     US Abdomen Complete  RUQ pain -     US Abdomen Complete  Class 1 obesity due to excess calories without serious comorbidity with body mass index (BMI) of 34.0 to 34.9 in adult -     Amb ref to Medical Nutrition Therapy-MNT  Abnormal urine odor -     Urine Culture   .[name] Depression screen PHQ 2/9 09/11/2017  Decreased Interest 1  Down, Depressed, Hopeless 1  PHQ - 2 Score 2  Altered sleeping 0  Tired, decreased energy 0  Change in appetite 1  Feeling bad or failure about yourself  1  Trouble concentrating 0  Moving slowly or fidgety/restless 0  Suicidal thoughts 0  PHQ-9 Score 4  Difficult doing work/chores Not difficult at all   .[name] Discussed 150 minutes of exercise a week.  Encouraged vitamin D 1000 units and Calcium 1300mg  or 4 servings of dairy a day.  Fasting labs ordered today.  Mammogram ordered.  Colonoscopy up to date.   Pt is obese. Discussed healthy diet and exercise. Weight loss could help hip pain.pt declined imaging today. I did refer her to nutirtionist.    Concerned with abdominal exam today. There was tenderness and pain on exam. Will get u/s of abdomen. Will start omeprazole daily. Will STOP ASA and NSAIDs. Follow up in 1 month sooner if needed based on labs or imaging.

## 2017-09-12 ENCOUNTER — Ambulatory Visit (INDEPENDENT_AMBULATORY_CARE_PROVIDER_SITE_OTHER): Payer: BLUE CROSS/BLUE SHIELD

## 2017-09-12 DIAGNOSIS — Z1231 Encounter for screening mammogram for malignant neoplasm of breast: Secondary | ICD-10-CM | POA: Diagnosis not present

## 2017-09-12 LAB — LIPASE: Lipase: 20 U/L (ref 7–60)

## 2017-09-12 NOTE — Progress Notes (Signed)
Call pt: normal mammogram. Follow up in 1 year.

## 2017-09-13 ENCOUNTER — Ambulatory Visit (INDEPENDENT_AMBULATORY_CARE_PROVIDER_SITE_OTHER): Payer: BLUE CROSS/BLUE SHIELD

## 2017-09-13 DIAGNOSIS — K760 Fatty (change of) liver, not elsewhere classified: Secondary | ICD-10-CM

## 2017-09-13 LAB — CBC
HEMATOCRIT: 40.1 % (ref 35.0–45.0)
Hemoglobin: 14.5 g/dL (ref 11.7–15.5)
MCH: 34.1 pg — AB (ref 27.0–33.0)
MCHC: 36.2 g/dL — ABNORMAL HIGH (ref 32.0–36.0)
MCV: 94.4 fL (ref 80.0–100.0)
MPV: 10.3 fL (ref 7.5–12.5)
PLATELETS: 267 10*3/uL (ref 140–400)
RBC: 4.25 10*6/uL (ref 3.80–5.10)
RDW: 12.3 % (ref 11.0–15.0)
WBC: 6.7 10*3/uL (ref 3.8–10.8)

## 2017-09-13 LAB — COMPLETE METABOLIC PANEL WITH GFR
AG RATIO: 1.6 (calc) (ref 1.0–2.5)
ALT: 59 U/L — ABNORMAL HIGH (ref 6–29)
AST: 37 U/L — ABNORMAL HIGH (ref 10–35)
Albumin: 3.9 g/dL (ref 3.6–5.1)
Alkaline phosphatase (APISO): 69 U/L (ref 33–130)
BUN: 14 mg/dL (ref 7–25)
CALCIUM: 9.8 mg/dL (ref 8.6–10.4)
CO2: 29 mmol/L (ref 20–32)
CREATININE: 0.94 mg/dL (ref 0.50–0.99)
Chloride: 104 mmol/L (ref 98–110)
GFR, EST AFRICAN AMERICAN: 74 mL/min/{1.73_m2} (ref >=60)
GFR, EST NON AFRICAN AMERICAN: 64 mL/min/{1.73_m2} (ref >=60)
GLOBULIN: 2.5 g/dL (ref 1.9–3.7)
Glucose, Bld: 99 mg/dL (ref 65–99)
POTASSIUM: 4.5 mmol/L (ref 3.5–5.3)
SODIUM: 139 mmol/L (ref 135–146)
TOTAL PROTEIN: 6.4 g/dL (ref 6.1–8.1)
Total Bilirubin: 0.4 mg/dL (ref 0.2–1.2)

## 2017-09-13 LAB — LIPID PANEL W/REFLEX DIRECT LDL
CHOLESTEROL: 274 mg/dL — AB (ref <200)
HDL: 43 mg/dL — AB (ref >50)
LDL Cholesterol (Calc): 197 mg/dL (calc) — ABNORMAL HIGH
Non-HDL Cholesterol (Calc): 231 mg/dL (calc) — ABNORMAL HIGH (ref <130)
Total CHOL/HDL Ratio: 6.4 (calc) — ABNORMAL HIGH (ref <5.0)
Triglycerides: 172 mg/dL — ABNORMAL HIGH (ref <150)

## 2017-09-13 LAB — HEMOGLOBIN A1C
EAG (MMOL/L): 6.3 (calc)
Hgb A1c MFr Bld: 5.6 % of total Hgb (ref <5.7)
Mean Plasma Glucose: 114 (calc)

## 2017-09-13 LAB — HEPATITIS C ANTIBODY
HEP C AB: NONREACTIVE
SIGNAL TO CUT-OFF: 0.02 (ref <1.00)

## 2017-09-16 ENCOUNTER — Encounter: Payer: Self-pay | Admitting: Physician Assistant

## 2017-09-16 DIAGNOSIS — K760 Fatty (change of) liver, not elsewhere classified: Secondary | ICD-10-CM | POA: Insufficient documentation

## 2017-09-16 MED ORDER — ATORVASTATIN CALCIUM 40 MG PO TABS: 40.0000 mg | ORAL_TABLET | Freq: Every day | ORAL | 3 refills | Status: DC

## 2017-09-16 NOTE — Progress Notes (Signed)
Sent!

## 2017-09-16 NOTE — Addendum Note (Signed)
Addended by: Donella Stade on: 09/16/2017 10:05 PM   Modules accepted: Orders

## 2017-09-16 NOTE — Progress Notes (Signed)
Call pt: pancreatic enzymes look good.  Hep C negative.  Sugars stable but borderline "prediabetes".  Cholesterol is very high. You really need to be on a statin. What are your thoughts?  Liver enzyme elevated likely due to fatty liver via ultrasound.

## 2017-09-16 NOTE — Progress Notes (Signed)
Call pt: no gallbladder etiology . Fat accumulation around liver and weight loss could help this. Excessive alcohol can do this as well. Watch alcohol consumption.

## 2017-09-17 MED FILL — ATORVASTATIN 40 MG TABLET: 40 | 90 days supply | Qty: 90 | Fill #0

## 2017-09-27 MED FILL — PROAIR RESPICLICK INHAL PWD: 108 (90 BAS | 25 days supply | Qty: 1 | Fill #2

## 2017-10-28 ENCOUNTER — Encounter: Payer: Self-pay | Admitting: Emergency Medicine

## 2017-10-28 ENCOUNTER — Emergency Department
Admission: EM | Admit: 2017-10-28 | Discharge: 2017-10-28 | Disposition: A | Discharge status: Home / Self Care | Payer: BLUE CROSS/BLUE SHIELD | Source: Home / Self Care

## 2017-10-28 DIAGNOSIS — L089 Local infection of the skin and subcutaneous tissue, unspecified: Secondary | ICD-10-CM

## 2017-10-28 DIAGNOSIS — W57XXXA Bitten or stung by nonvenomous insect and other nonvenomous arthropods, initial encounter: Secondary | ICD-10-CM

## 2017-10-28 MED ORDER — DOXYCYCLINE HYCLATE 100 MG PO CAPS: 100.0000 mg | ORAL_CAPSULE | Freq: Two times a day (BID) | ORAL | 0 refills | Status: DC

## 2017-10-28 MED FILL — DOXYCYCLINE HYCLATE 100 MG: 100 | 10 days supply | Qty: 20 | Fill #0

## 2017-10-28 NOTE — ED Provider Notes (Signed)
[name]    CSN: [id number] Arrival date & time: 10/28/17  1550     History   Chief Complaint Chief Complaint  Patient presents with  . Tick Removal    HPI Marissa Willis is a 62 y.o. female.  Patient was mowing her lawn last week on Tuesday and 2 days later found a tick bite on her right buttock.  Her husband plucked it off.  It is gotten more erythematous around the area that the tick was removed from.  The tick pulled out a little patch of skin with it.  The area of erythema is about the size of a quarter.  It itches.  She is not sick. HPI  Past Medical History:  Diagnosis Date  . Asthma with COPD ([place])   . Complex partial seizure disorder [facility]) neurologist-  dr Krista Blue   dx 2014  after x2 seiaures march and sept 2014  -- on keppra  . 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 History of kidney stones   . History of partial seizures    x2 seizure in 2014  dx complex parital seizures started on keppra-- per pt no seizures since off keppra  . OSA on CPAP   . Retained ureteral stent    right side migrated  . SUI (stress urinary incontinence, female)    mild    Patient Active Problem List   Diagnosis Date Noted  . Hepatic steatosis 09/16/2017  . Bilateral hip pain 09/11/2017  . Epigastric pain 09/11/2017  . RUQ pain 09/11/2017  . Class 1 obesity due to excess calories without serious comorbidity with body mass index (BMI) of 34.0 to 34.9 in adult 09/11/2017  . Finger fracture, right 08/24/2016  . Kidney calculi 01/12/2016  . Renal calculi 12/09/2015  . Bilateral lower extremity edema 07/06/2015  . Aortic atherosclerosis ([place]) 12/28/2014  . Former smoker 12/09/2013  . Hyperlipidemia 12/09/2013  . Complex partial seizure ([place]) 03/02/2013  . COPD (chronic obstructive pulmonary disease) ([place]) 12/26/2012  . Asthma 12/26/2012    Past Surgical History:  Procedure Laterality Date  . ANAL FISSURE  REPAIR  1994  . BLADDER SUSPENSION  1991  . 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 CYSTOSCOPY WITH URETEROSCOPY AND STENT PLACEMENT Right 12/09/2015   Procedure: CYSTOSCOPY WITH retrograde AND STENT PLACEMENT;  Surgeon: Franchot Gallo, MD;  Location: [facility];  Service: Urology;  Laterality: Right;  . CYSTOSCOPY WITH URETEROSCOPY AND STENT PLACEMENT Right 02/23/2016   Procedure: CYSTOSCOPY  AND STENT EXTRACTION;  Surgeon: Franchot Gallo, MD;  Location[facility];  Service: Urology;  Laterality: Right;  . IR GENERIC HISTORICAL  01/12/2016   IR URETERAL STENT RIGHT NEW ACCESS W/O SEP NEPHROSTOMY CATH 01/12/2016 Corrie Mckusick, DO [facility]  . NEPHROLITHOTOMY Right 01/12/2016   Procedure: RIGHT  PERCUTANEOUS NEPHROLITHOTOMY  insertion double j stent flexible cystoscopy  removable right double j stent;  Surgeon: Franchot Gallo, MD;  Location: [facility];  Service: Urology;  Laterality: Right;  . TUBAL LIGATION  1991    OB History   None      Home Medications    Prior to Admission medications   Medication Sig Start Date End Date Taking? Authorizing Provider  Albuterol Sulfate (PROAIR RESPICLICK) 865 (90 Base) MCG/ACT AEPB Inhale 2 puffs into the lungs every 6 (six) hours as needed (shortness  of breath/wheezing). 11/06/16   Breeback, Jade L, PA-C  atorvastatin (LIPITOR) 40 MG tablet Take 1 tablet (40 mg total) by mouth daily. 09/16/17   Breeback, Royetta Car, PA-C  omeprazole (PRILOSEC) 40 MG capsule Take 1 capsule (40 mg total) by mouth daily. 09/11/17   Donella Stade, PA-C    Family History Family History  Problem Relation Age of Onset  . Diabetes Mother   . Heart disease Mother   . Hypertension Mother   . Heart disease Father     Social History Social History   Tobacco Use  . Smoking status: Former Smoker    Packs/day: 1.50    Years: 45.00    Pack years: 67.50    Types: Cigarettes    Start date:  07/20/2013    Last attempt to quit: 01/09/2014    Years since quitting: 3.8  . Smokeless tobacco: Never Used  Substance Use Topics  . Alcohol use: Yes    Alcohol/week: 0.0 oz    Comment: 1-2 drinks daily  . Drug use: Yes    Types: Marijuana     Allergies   Patient has no known allergies.   Review of Systems Review of Systems Constitutional: Unremarkable Musculoskeletal: Unremarkable Dermatologic: As above  Physical Exam Triage Vital Signs ED Triage Vitals [10/28/17 1614]  Enc Vitals Group     BP (!) 146/74     Pulse Rate 74     Resp      Temp 98 F (36.7 C)     Temp Source Oral     SpO2 95 %     Weight 187 lb (84.8 kg)     Height      Head Circumference      Peak Flow      Pain Score 0     Pain Loc      Pain Edu?      Excl. in [place]?    No data found.  Updated Vital Signs BP (!) 146/74 (BP Location: Right Arm)   Pulse 74   Temp 98 F (36.7 C) (Oral)   Wt 187 lb (84.8 kg)   SpO2 95%   BMI 32.10 kg/m   Visual Acuity Right Eye Distance:   Left Eye Distance:   Bilateral Distance:    Right Eye Near:   Left Eye Near:    Bilateral Near:     Physical Exam Bite on hip where a little central white spot where the tick was pulled off from, surrounded by about a 1.5 to 2 cm rim of bright erythema, homogenous.  Mildly indurated.  UC Treatments / Results  Labs (all labs ordered are listed, but only abnormal results are displayed) Labs Reviewed - No data to display  EKG None  Radiology No results found.  Procedures Procedures (including critical care time)  Medications Ordered in UC Medications - No data to display  Initial Impression / Assessment and Plan / UC Course  I have reviewed the triage vital signs and the nursing notes.  Pertinent labs & imaging results that were available during my care of the patient were reviewed by me and considered in my medical decision making (see chart for details).     Tick bite with local inflammation, probably  does not represent a true erythema migrans but more just the local inflammation from the tick bite. Final Clinical Impressions(s) / UC Diagnoses   Final diagnoses:  Tick bite, initial encounter  Skin inflammation     Discharge Instructions  Take doxycycline 100 mg 1 twice daily.  Sunburn precautions as discussed.  Take over-the-counter Zyrtec (cetirizine) if needed for itching.  Topical Caladryl or hydrocortisone cream can help soothe the symptoms if needed.  Return or go see your primary care doctor if symptoms are getting worse or you are showing signs of illness with fever, muscle aches, headache, neck stiffness, etc.      ED Prescriptions    None     Controlled Substance Prescriptions [place] Controlled Substance Registry consulted? No   Posey Boyer, MD 10/28/17 [phone]

## 2017-10-28 NOTE — Discharge Instructions (Signed)
Take doxycycline 100 mg 1 twice daily.  Sunburn precautions as discussed.  Take over-the-counter Zyrtec (cetirizine) if needed for itching.  Topical Caladryl or hydrocortisone cream can help soothe the symptoms if needed.  Return or go see your primary care doctor if symptoms are getting worse or you are showing signs of illness with fever, muscle aches, headache, neck stiffness, etc.

## 2017-10-28 NOTE — ED Triage Notes (Signed)
Pt states she noticed a tick on Thursday on her right hip. She mowed her yard on Tuesday. She removed it with tweezers. C/o itching and burning at site. Denies rash.

## 2018-01-22 ENCOUNTER — Telehealth: Payer: Self-pay | Admitting: Physician Assistant

## 2018-01-22 DIAGNOSIS — Z1211 Encounter for screening for malignant neoplasm of colon: Secondary | ICD-10-CM

## 2018-01-22 NOTE — Telephone Encounter (Signed)
Pt called and states she is over due for her Routine Colonoscopy Screening and would like a referral.

## 2018-01-22 NOTE — Telephone Encounter (Signed)
Spoke with Pt, we have report where pt states she had last one in 03/2008. She reports she had polyps and was supposed to follow up in 3 years. We do not have record. She is overdue. Referral placed. She would prefer [place] location.

## 2018-02-14 ENCOUNTER — Other Ambulatory Visit: Payer: Self-pay | Admitting: Physician Assistant

## 2018-02-14 ENCOUNTER — Encounter: Payer: Self-pay | Admitting: Physician Assistant

## 2018-02-14 ENCOUNTER — Ambulatory Visit (INDEPENDENT_AMBULATORY_CARE_PROVIDER_SITE_OTHER): Payer: BLUE CROSS/BLUE SHIELD | Admitting: Physician Assistant

## 2018-02-14 VITALS — BP 150/70 | HR 59 | Ht 64.0 in | Wt 185.0 lb

## 2018-02-14 DIAGNOSIS — L0591 Pilonidal cyst without abscess: Secondary | ICD-10-CM

## 2018-02-14 DIAGNOSIS — E782 Mixed hyperlipidemia: Secondary | ICD-10-CM

## 2018-02-14 DIAGNOSIS — K648 Other hemorrhoids: Secondary | ICD-10-CM | POA: Diagnosis not present

## 2018-02-14 DIAGNOSIS — Z1211 Encounter for screening for malignant neoplasm of colon: Secondary | ICD-10-CM | POA: Diagnosis not present

## 2018-02-14 DIAGNOSIS — D125 Benign neoplasm of sigmoid colon: Secondary | ICD-10-CM | POA: Diagnosis not present

## 2018-02-14 DIAGNOSIS — R1907 Generalized intra-abdominal and pelvic swelling, mass and lump: Secondary | ICD-10-CM

## 2018-02-14 DIAGNOSIS — R7301 Impaired fasting glucose: Secondary | ICD-10-CM

## 2018-02-14 DIAGNOSIS — R600 Localized edema: Secondary | ICD-10-CM

## 2018-02-14 DIAGNOSIS — K635 Polyp of colon: Secondary | ICD-10-CM

## 2018-02-14 MED ORDER — ROSUVASTATIN CALCIUM 20 MG PO TABS: 20.0000 mg | ORAL_TABLET | Freq: Every day | ORAL | 1 refills | Status: DC

## 2018-02-14 MED ORDER — HYDROCORTISONE ACETATE 25 MG RE SUPP: 25.0000 mg | Freq: Two times a day (BID) | RECTAL | 0 refills | Status: DC

## 2018-02-14 MED ORDER — HYDROCHLOROTHIAZIDE 12.5 MG PO TABS: 12.5000 mg | ORAL_TABLET | Freq: Every day | ORAL | 2 refills | Status: DC

## 2018-02-14 MED FILL — HYDROCHLOROTHIAZIDE 12.5 MG: 12.5 | 30 days supply | Qty: 30 | Fill #0

## 2018-02-14 MED FILL — ROSUVASTATIN CALCIUM 20 MG: 20 | 90 days supply | Qty: 90 | Fill #0

## 2018-02-14 NOTE — Progress Notes (Signed)
Labs ordered for imaging

## 2018-02-14 NOTE — Progress Notes (Signed)
Subjective:    Patient ID: Marissa Willis, female    DOB: Aug 23, 1955, 62 y.o.   MRN: [id number]  HPI  Pt is a 62 yo female who presents to the clinic with painful bump on the left side of her tailbone. She has noticed pain when sitting and bump for about 1 year and has gotten progressively worse. A few weeks ago she noticed a boil like lesion in that area but it cleared with warm compresses. She comes in today because it is more and more painful. Notices it the most when she is sitting and especially when she is in the care. Her brother had a pilonidal cyst. She has a hx of internal hemorrhoids and colon polyps. Her last colonoscopy was 03/2008 and was supposed to follow up in 5 years. She has noticed more bright red blood with bowel movements. No fever, chills, body aches. No recent trauma.   lipitor causes muscle aches. She stopped taking would like to try something else.   .. Active Ambulatory Problems    Diagnosis Date Noted  . COPD (chronic obstructive pulmonary disease) ([place]) 12/26/2012  . Asthma 12/26/2012  . Complex partial seizure ([place]) 03/02/2013  . Former smoker 12/09/2013  . Hyperlipidemia 12/09/2013  . Aortic atherosclerosis ([place]) 12/28/2014  . Bilateral lower extremity edema 07/06/2015  . Renal calculi 12/09/2015  . Kidney calculi 01/12/2016  . Finger fracture, right 08/24/2016  . Bilateral hip pain 09/11/2017  . Epigastric pain 09/11/2017  . RUQ pain 09/11/2017  . Class 1 obesity due to excess calories without serious comorbidity with body mass index (BMI) of 34.0 to 34.9 in adult 09/11/2017  . Hepatic steatosis 09/16/2017   Resolved Ambulatory Problems    Diagnosis Date Noted  . Hand injury, right, initial encounter 08/10/2016   Past Medical History:  Diagnosis Date  . Asthma with COPD ([place])   . Complex partial seizure disorder [facility]) neurologist-  dr Krista Blue  . History of cerebral aneurysm repair   . History of kidney stones   . History of partial seizures   .  OSA on CPAP   . Retained ureteral stent   . SUI (stress urinary incontinence, female)      Review of Systems See HPI.     Objective:   Physical Exam  Constitutional: She is oriented to person, place, and time. She appears well-developed and well-nourished.  HENT:  Head: Normocephalic and atraumatic.  Cardiovascular: Normal rate and regular rhythm.  Pulmonary/Chest: Effort normal and breath sounds normal.  Genitourinary:     Neurological: She is alert and oriented to person, place, and time.  Psychiatric: She has a normal mood and affect. Her behavior is normal.          Assessment & Plan:  [name] KitchenMarland KitchenDiagnoses and all orders for this visit:  Cyst near tailbone -     CT PELVIS W CONTRAST  Colon cancer screening -     Ambulatory referral to Gastroenterology  Polyp of sigmoid colon, unspecified type -     Ambulatory referral to Gastroenterology  Internal hemorrhoids -     hydrocortisone (ANUSOL-HC) 25 MG suppository; Place 1 suppository (25 mg total) rectally 2 (two) times daily.  Generalized intra-abdominal and pelvic swelling, mass and lump -     CT PELVIS W CONTRAST  Bilateral lower extremity edema -     hydrochlorothiazide (HYDRODIURIL) 12.5 MG tablet; Take 1 tablet (12.5 mg total) by mouth daily. As needed for lower extremity swelling.  Mixed hyperlipidemia -  rosuvastatin (CRESTOR) 20 MG tablet; Take 1 tablet (20 mg total) by mouth daily.   Unclear etiology. Suspect pilonidal cyst. Due to duration. Will get CT of pelvis. Pt is past due for colonoscopy. Ordered today. anusol given for hemorrhoids. For now warm compresses and avoid sitting without good cushion and support.   Will try switch to crestor for HLD. Hopefully with a different metabolic pathway she will tolerate better.

## 2018-02-14 NOTE — Patient Instructions (Signed)
Pilonidal Cyst A pilonidal cyst is a fluid-filled sac. It forms beneath the skin near your tailbone, at the top of the crease of your buttocks. A pilonidal cyst that is not large or infected may not cause symptoms or problems. If the cyst becomes irritated or infected, it may fill with pus. This causes pain and swelling (pilonidal abscess). An infected cyst may need to be treated with medicine, drained, or removed. What are the causes? The cause of a pilonidal cyst is not known. One cause may be a hair that grows into your skin (ingrown hair). What increases the risk? Pilonidal cysts are more common in boys and men. Risk factors include:  Having lots of hair near the crease of the buttocks.  Being overweight.  Having a pilonidal dimple.  Wearing tight clothing.  Not bathing or showering frequently.  Sitting for long periods of time.  What are the signs or symptoms? Signs and symptoms of a pilonidal cyst may include:  Redness.  Pain and tenderness.  Warmth.  Swelling.  Pus.  Fever.  How is this diagnosed? Your health care provider may diagnose a pilonidal cyst based on your symptoms and a physical exam. The health care provider may do a blood test to check for infection. If your cyst is draining pus, your health care provider may take a sample of the drainage to be tested at a laboratory. How is this treated? Surgery is the usual treatment for an infected pilonidal cyst. You may also have to take medicines before surgery. The type of surgery you have depends on the size and severity of the infected cyst. The different kinds of surgery include:  Incision and drainage. This is a procedure to open and drain the cyst.  Marsupialization. In this procedure, a large cyst or abscess may be opened and kept open by stitching the edges of the skin to the cyst walls.  Cyst removal. This procedure involves opening the skin and removing all or part of the cyst.  Follow these  instructions at home:  Follow all of your surgeon's instructions carefully if you had surgery.  Take medicines only as directed by your health care provider.  If you were prescribed an antibiotic medicine, finish it all even if you start to feel better.  Keep the area around your pilonidal cyst clean and dry.  Clean the area as directed by your health care provider. Pat the area dry with a clean towel. Do not rub it as this may cause bleeding.  Remove hair from the area around the cyst as directed by your health care provider.  Do not wear tight clothing or sit in one place for long periods of time.  There are many different ways to close and cover an incision, including stitches, skin glue, and adhesive strips. Follow your health care provider's instructions on: ? Incision care. ? Bandage (dressing) changes and removal. ? Incision closure removal. Contact a health care provider if:  You have drainage, redness, swelling, or pain at the site of the cyst.  You have a fever. This information is not intended to replace advice given to you by your health care provider. Make sure you discuss any questions you have with your health care provider. Document Released: 06/01/2000 Document Revised: 11/10/2015 Document Reviewed: 10/22/2013 Elsevier Interactive Patient Education  2018 Elsevier Inc.  

## 2018-02-17 ENCOUNTER — Telehealth: Payer: Self-pay | Admitting: Physician Assistant

## 2018-02-17 ENCOUNTER — Encounter: Payer: Self-pay | Admitting: Physician Assistant

## 2018-02-17 DIAGNOSIS — R1907 Generalized intra-abdominal and pelvic swelling, mass and lump: Secondary | ICD-10-CM | POA: Insufficient documentation

## 2018-02-17 DIAGNOSIS — K648 Other hemorrhoids: Secondary | ICD-10-CM | POA: Insufficient documentation

## 2018-02-17 DIAGNOSIS — K635 Polyp of colon: Secondary | ICD-10-CM | POA: Insufficient documentation

## 2018-02-17 DIAGNOSIS — L0591 Pilonidal cyst without abscess: Secondary | ICD-10-CM | POA: Insufficient documentation

## 2018-02-17 DIAGNOSIS — D125 Benign neoplasm of sigmoid colon: Secondary | ICD-10-CM

## 2018-02-17 DIAGNOSIS — Z1211 Encounter for screening for malignant neoplasm of colon: Secondary | ICD-10-CM

## 2018-02-17 NOTE — Telephone Encounter (Signed)
Print off colonoscopy in careeverywhere and abstract please.

## 2018-02-19 ENCOUNTER — Other Ambulatory Visit: Payer: Self-pay | Admitting: Physician Assistant

## 2018-02-19 DIAGNOSIS — R1907 Generalized intra-abdominal and pelvic swelling, mass and lump: Secondary | ICD-10-CM

## 2018-02-19 NOTE — Telephone Encounter (Signed)
Per patient she will check.

## 2018-02-19 NOTE — Telephone Encounter (Signed)
Patient called and stated that she called her insurance and it would be cheaper for her to get imaging at [facility]. Can you place new orders? Thanks

## 2018-02-19 NOTE — Progress Notes (Signed)
C-

## 2018-02-19 NOTE — Telephone Encounter (Signed)
Order placed. Do we need to call to get her scheduled?

## 2018-02-20 LAB — COMPLETE METABOLIC PANEL WITH GFR
AG RATIO: 1.4 (calc) (ref 1.0–2.5)
ALT: 46 U/L — AB (ref 6–29)
AST: 30 U/L (ref 10–35)
Albumin: 4.2 g/dL (ref 3.6–5.1)
Alkaline phosphatase (APISO): 77 U/L (ref 33–130)
BUN: 17 mg/dL (ref 7–25)
CO2: 27 mmol/L (ref 20–32)
Calcium: 9.8 mg/dL (ref 8.6–10.4)
Chloride: 101 mmol/L (ref 98–110)
Creat: 0.94 mg/dL (ref 0.50–0.99)
GFR, EST AFRICAN AMERICAN: 74 mL/min/{1.73_m2} (ref >=60)
GFR, EST NON AFRICAN AMERICAN: 64 mL/min/{1.73_m2} (ref >=60)
GLUCOSE: 94 mg/dL (ref 65–99)
Globulin: 2.9 g/dL (calc) (ref 1.9–3.7)
Potassium: 3.9 mmol/L (ref 3.5–5.3)
Sodium: 138 mmol/L (ref 135–146)
TOTAL PROTEIN: 7.1 g/dL (ref 6.1–8.1)
Total Bilirubin: 0.6 mg/dL (ref 0.2–1.2)

## 2018-02-21 NOTE — Progress Notes (Signed)
Call pt: labs look good. One liver enzyme just a hair elevated. Better than previous labs.

## 2018-02-25 NOTE — Telephone Encounter (Signed)
Awaiting colonoscopy results.   She is Elta Angell in their records.     [place] Address: [address] [place], [place], [place] [address] Phone: [phone]

## 2018-03-04 ENCOUNTER — Telehealth: Payer: Self-pay | Admitting: Physician Assistant

## 2018-03-04 DIAGNOSIS — L0591 Pilonidal cyst without abscess: Secondary | ICD-10-CM

## 2018-03-04 DIAGNOSIS — K5790 Diverticulosis of intestine, part unspecified, without perforation or abscess without bleeding: Secondary | ICD-10-CM | POA: Insufficient documentation

## 2018-03-04 NOTE — Telephone Encounter (Signed)
CT results reviewed. No acute abnormalities.  You did have some plaque seen in some of your arteries. Starting back on lipitor could help with this.  You do have some diverticulosis(pockets) in colon.  Nothing to worry about.   Do you still have the mass?

## 2018-03-05 ENCOUNTER — Other Ambulatory Visit: Payer: Self-pay

## 2018-03-05 MED ORDER — ATORVASTATIN CALCIUM 40 MG PO TABS: 40.0000 mg | ORAL_TABLET | Freq: Every day | ORAL | 3 refills | Status: DC

## 2018-03-05 MED FILL — ATORVASTATIN 40 MG TABLET: 40 | 90 days supply | Qty: 90 | Fill #0

## 2018-03-05 NOTE — Telephone Encounter (Signed)
Done

## 2018-03-05 NOTE — Telephone Encounter (Signed)
I called and spoke with Marissa Willis about her CT results. Patient states she would like to start lipitor. Patient states she is still in pain today and still has the mass as well.

## 2018-03-05 NOTE — Telephone Encounter (Signed)
I sent referral to general surgery to consult about cyst near tailbone.   Ok to send lipitor 20mg  daily #30 refills 5

## 2018-03-06 ENCOUNTER — Encounter: Payer: Self-pay | Admitting: Physician Assistant

## 2018-03-12 ENCOUNTER — Ambulatory Visit: Payer: BLUE CROSS/BLUE SHIELD | Admitting: Physician Assistant

## 2018-03-24 ENCOUNTER — Telehealth: Payer: Self-pay | Admitting: Surgery

## 2018-03-24 NOTE — Telephone Encounter (Signed)
Patient evaluated for pain and subjective sensation of mass just left of the coccyx.  CT scan unrevealing. No mass on physical exam.  MRI pelvis ordered to evaluate for ligamentous or fistulous etiology of pain. Patient's insurance contacted for peer to peer authorization for MRI Spoke with nurse reviewer, "Alinda Sierras," and then with Dr. Barbaraann Boys.  MRI approved for diagnostic imaging valid until November 1 Authorization # [id number]

## 2018-03-25 ENCOUNTER — Other Ambulatory Visit: Payer: Self-pay | Admitting: Surgery

## 2018-03-25 DIAGNOSIS — M533 Sacrococcygeal disorders, not elsewhere classified: Secondary | ICD-10-CM

## 2018-03-26 NOTE — Addendum Note (Signed)
Addended by: Narda Rutherford on: 03/26/2018 08:34 AM   Modules accepted: Orders

## 2018-03-26 NOTE — Telephone Encounter (Signed)
Ok it has been 10 years. Is she ok with ordering a new colonoscopy?

## 2018-03-26 NOTE — Telephone Encounter (Signed)
The only colonoscopy record from [place] is from 2009.

## 2018-03-26 NOTE — Telephone Encounter (Addendum)
Sent referral. Patient agreed.

## 2018-03-30 ENCOUNTER — Ambulatory Visit
Admission: RE | Admit: 2018-03-30 | Discharge: 2018-03-30 | Disposition: A | Discharge status: Home / Self Care | Payer: BLUE CROSS/BLUE SHIELD | Source: Ambulatory Visit | Attending: Surgery | Admitting: Surgery

## 2018-03-30 DIAGNOSIS — M533 Sacrococcygeal disorders, not elsewhere classified: Secondary | ICD-10-CM

## 2018-03-30 MED ORDER — GADOBENATE DIMEGLUMINE 529 MG/ML IV SOLN
17.0000 mL | Freq: Once | INTRAVENOUS | Status: AC | PRN
  Administered 2018-03-30: 17 mL via INTRAVENOUS

## 2018-03-31 ENCOUNTER — Telehealth: Payer: Self-pay | Admitting: Surgery

## 2018-03-31 NOTE — Telephone Encounter (Signed)
Patient updated with MRI report. No new fistula or mass or any structurally apparent cause for her pain. She reports some stretching has been helping. Will refer for pelvic PT. No surgical follow up required at this time.

## 2018-04-16 ENCOUNTER — Other Ambulatory Visit: Payer: Self-pay

## 2018-04-16 ENCOUNTER — Encounter: Payer: Self-pay | Admitting: Physical Therapy

## 2018-04-16 ENCOUNTER — Ambulatory Visit: Payer: BLUE CROSS/BLUE SHIELD | Attending: Surgery | Admitting: Physical Therapy

## 2018-04-16 DIAGNOSIS — R252 Cramp and spasm: Secondary | ICD-10-CM | POA: Insufficient documentation

## 2018-04-16 DIAGNOSIS — M6281 Muscle weakness (generalized): Secondary | ICD-10-CM | POA: Diagnosis present

## 2018-04-16 NOTE — Therapy (Addendum)
[facility] [address], [place], [place], [address] Phone: [phone]   Fax:  [phone]  Physical Therapy Treatment  Patient Details  Name: Marissa Willis MRN: [id number] Date of Birth: 08-Jun-1956 Referring Provider (PT): Clovis Riley, MD   Encounter Date: 04/16/2018      04/16/18 1521      PT Visits / Re-Eval  Visit Number 1  Number of Visits    Date for PT Re-Evaluation 07/09/2018  Authorization  Authorization Type    Authorization Time Period    Authorization - Visit Number    Authorization - Number of Visits    PT Time Calculation  PT Start Time 1446  PT Stop Time 1528  PT Time Calculation (min) 42 min (calculated)  PT - End of Session  Equipment Utilized During Treatment    Activity Tolerance Patient tolerated treatment well; Patient limited by pain  Behavior During Therapy [facility] for tasks assessed/performed     Past Medical History:  Diagnosis Date  . Asthma with COPD ([place])   . Complex partial seizure disorder [facility]) neurologist-  dr Krista Blue   dx 2014  after x2 seiaures march and sept 2014  -- on keppra  . 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 History of kidney stones   . History of partial seizures    x2 seizure in 2014  dx complex parital seizures started on keppra-- per pt no seizures since off keppra  . OSA on CPAP   . Retained ureteral stent    right side migrated  . SUI (stress urinary incontinence, female)    mild    Past Surgical History:  Procedure Laterality Date  . ANAL FISSURE REPAIR  1994  . BLADDER SUSPENSION  1991  . CEREBRAL EMBOLIZATION  2011     at 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 CYSTOSCOPY WITH URETEROSCOPY AND STENT PLACEMENT Right 12/09/2015   Procedure: CYSTOSCOPY WITH retrograde AND STENT PLACEMENT;  Surgeon: Franchot Gallo, MD;  Location: [facility];  Service:  Urology;  Laterality: Right;  . CYSTOSCOPY WITH URETEROSCOPY AND STENT PLACEMENT Right 02/23/2016   Procedure: CYSTOSCOPY  AND STENT EXTRACTION;  Surgeon: Franchot Gallo, MD;  Location: [facility];  Service: Urology;  Laterality: Right;  . IR GENERIC HISTORICAL  01/12/2016   IR URETERAL STENT RIGHT NEW ACCESS W/O SEP NEPHROSTOMY CATH 01/12/2016 Corrie Mckusick, DO [facility]  . NEPHROLITHOTOMY Right 01/12/2016   Procedure: RIGHT  PERCUTANEOUS NEPHROLITHOTOMY  insertion double j stent flexible cystoscopy  removable right double j stent;  Surgeon: Franchot Gallo, MD;  Location: [facility];  Service: Urology;  Laterality: Right;  . TUBAL LIGATION  1991    There were no vitals filed for this visit.      [facility] PT Assessment - 04/18/18 0001      Assessment   Medical Diagnosis  M53.3 (ICD-10-CM) - Coccydynia    Referring Provider (PT)  Clovis Riley, MD    Onset Date/Surgical Date  --   about a year ago and getting worse   Prior Therapy  No      Precautions   Precautions  None      Restrictions   Weight Bearing Restrictions  No      Balance Screen   Has the patient fallen in the past 6 months  No      Home Environment  Living Environment  Private residence    Living Arrangements  Spouse/significant other      Prior Function   Level of Independence  Independent    Vocation  Part time employment    Vocation Requirements  dog walking - 3 miles/day      Cognition   Overall Cognitive Status  Within Functional Limits for tasks assessed      Posture/Postural Control   Posture/Postural Control  Postural limitations    Postural Limitations  Anterior pelvic tilt;Increased lumbar lordosis;Rounded Shoulders      PROM   Overall PROM Comments  hip ER limited 50% bilat      Strength   Right Hip Extension  4/5    Right Hip External Rotation   4/5    Right Hip ABduction  4/5    Left Hip Extension  4/5    Left Hip External Rotation  4/5    Left Hip ABduction  4/5       Palpation   SI assessment   WNL    Palpation comment  glutes very tender to palpation      Transfers   Comments  increased pain with sit to stand      Ambulation/Gait   Gait Pattern  Within Functional Limits                Pelvic Floor Special Questions - 04/18/18 0001    Prior Pelvic/Prostate Exam  Yes    Are you Pregnant or attempting pregnancy?  No    Prior Pregnancies  Yes    Number of Vaginal Deliveries  2    Any difficulty with labor and deliveries  Yes    Episiotomy Performed  Yes   a lot of tearing   Currently Sexually Active  No    Marinoff Scale  discomfort that does not affect completion   feels like it's hitting something   Urinary Leakage  Yes    How often  1-2/day    Pad use  pad 1/day    Activities that cause leaking  Coughing;Sneezing    Urinary urgency  Yes   mild urge then really have to get there quick   Fecal incontinence  No    Fluid intake  4-5 tall glasses/day    Falling out feeling (prolapse)  Yes    Skin Integrity  Intact;Hemorroids    Prolapse  Other    Prolapse other  possibly uterine prolapse, hard to see due to high tone and unable to fully relax bulbocavernosis    Pelvic Floor Internal Exam  pt identity confirmed and informed consent given to perform internal soft tissue assessment and treatment    Exam Type  Vaginal    Sensation  hypersensative throughout    Palpation  external palpation not sensative, internal palpation tender throughout especially pubococcygeus with decreased strength on left side, increased fascial restrictions and tenderness  along urethra Rt>LT    Strength  weak squeeze, no lift    Strength # of reps  2    Strength # of seconds  4    Tone  high           04/16/18 1520  Last Filed Value        PT Education  Education Details toilet and urge techniques toilet and urge techniques  Person(s) Educated Patient Patient  Methods Explanation; Demonstration; Handout; Verbal cues Explanation;  Demonstration; Handout; Verbal cues  Comprehension Verbalized understanding; Returned demonstration  PT Short Term Goals - 04/18/18 1742      PT SHORT TERM GOAL #1   Title  pt will have ability to have BM without excess bearing down    Time  4    Period  Weeks    Status  New    Target Date  05/14/18      PT SHORT TERM GOAL #2   Title  pt will be ind with initial HEP to stretch and bulge pelvic floor    Time  4    Period  Weeks    Status  New    Target Date  05/14/18        PT Long Term Goals - 04/17/18 0809      PT LONG TERM GOAL #1   Title  Pt will demonstrate bilateral hip strength of at least 4+/5 throughout for improved transfers without increased pain and muscle spasms in pelvic floor.    Time  12    Period  Weeks    Status  New    Target Date  07/09/18      PT LONG TERM GOAL #2   Title  pt will report 50% less pain during intercourse    Time  12    Period  Weeks    Status  New    Target Date  07/09/18      PT LONG TERM GOAL #3   Title  pt will report 50% less pain when doing sit to stand transfer    Time  12    Period  Weeks    Status  New    Target Date  07/09/18      PT LONG TERM GOAL #4   Title  pt will be able to return to a normal exercise routine as part of healthy lifestyle without increased coccys pain    Time  12    Period  Weeks    Status  New    Target Date  07/09/18      PT LONG TERM GOAL #5   Title  pt will be able to drive one one hour trips without increased tailbone pain    Time  12    Period  Weeks    Status  New    Target Date  07/09/18            Plan - 04/18/18 1135    Clinical Impression Statement  Pt presents to clnic due to coccyx pain that has been worsening over the last year.  She has severe tenderness to palpation of glutes and thorughout pelvic floor with fascial restrictions around the urethra.  She has tight and high tone pubococcygeus and levator ani muscles.  Pt demonstrates LE weakness  and mentioned above as well as pelvic floor MMT of 2/5.  Pt has urinary leakage with stress and urge daily.  She has decreased hip rotation and posture abnormalities mentioned above.  Pt lacks coordination and abilty to bulge pelvic floor muscles.  Pt will benefit from skilled PT to address impairments so she can return to functional activities and improved quality of life.      History and Personal Factors relevant to plan of care:  2 vaginal deliveries, bladder sling, tubal ligation surgery, anal fissure repair surgery    Clinical Presentation  Evolving    Clinical Presentation due to:  pt having worsening symptoms over the last year    Clinical Decision Making  Moderate    Rehab Potential  Excellent  PT Frequency  1x / week    PT Duration  8 weeks    PT Treatment/Interventions  ADLs/Self Care Home Management;Biofeedback;Cryotherapy;Electrical Stimulation;Moist Heat;Therapeutic activities;Patient/family education;Neuromuscular re-education;Therapeutic exercise;Manual techniques;Passive range of motion;Scar mobilization;Dry needling;Taping    PT Next Visit Plan  abdominal massage, glute and hip stretch, breathing and bulging pelvic floor, biofeedback downtraining    Recommended Other Services  eval 04/16/18    Consulted and Agree with Plan of Care  Patient       Patient will benefit from skilled therapeutic intervention in order to improve the following deficits and impairments:  Pain, Impaired tone, Increased muscle spasms, Decreased strength, Decreased range of motion, Decreased coordination  Visit Diagnosis: Cramp and spasm  Muscle weakness (generalized)     Problem List Patient Active Problem List   Diagnosis Date Noted  . Diverticulosis 03/04/2018  . Cyst near tailbone 02/17/2018  . Generalized intra-abdominal and pelvic swelling, mass and lump 02/17/2018  . Polyp of sigmoid colon 02/17/2018  . Internal hemorrhoids 02/17/2018  . Hepatic steatosis 09/16/2017  . Bilateral hip  pain 09/11/2017  . Epigastric pain 09/11/2017  . RUQ pain 09/11/2017  . Class 1 obesity due to excess calories without serious comorbidity with body mass index (BMI) of 34.0 to 34.9 in adult 09/11/2017  . Finger fracture, right 08/24/2016  . Kidney calculi 01/12/2016  . Renal calculi 12/09/2015  . Bilateral lower extremity edema 07/06/2015  . Aortic atherosclerosis ([place]) 12/28/2014  . Former smoker 12/09/2013  . Mixed hyperlipidemia 12/09/2013  . Complex partial seizure ([place]) 03/02/2013  . COPD (chronic obstructive pulmonary disease) ([place]) 12/26/2012  . Asthma 12/26/2012    Zannie Cove, PT 04/18/2018, 5:58 PM  [facility] [address] [place], [place], [address] Phone: [phone]   Fax:  [phone]  Name: Marissa Willis MRN: [id number] Date of Birth: 03-Mar-1956  PHYSICAL THERAPY DISCHARGE SUMMARY  Visits from Start of Care: 1  Current functional level related to goals / functional outcomes: See aobve, eval only   Remaining deficits: See above   Education / Equipment: HEP  Plan: Patient agrees to discharge.  Patient goals were not met. Patient is being discharged due to not returning since the last visit.  ?????    No f/u visits were scheduled  Zannie Cove, PT 05/20/18 9:43 AM

## 2018-04-16 NOTE — Patient Instructions (Signed)
Relaxation Exercises with the Urge to Void   When you experience an urge to void:  FIRST  Stop and stand very still    Sit down if you can    Don't move    You need to stay very still to maintain control  SECOND Squeeze your pelvic floor muscles 5 times, like a quick flick, to keep from leaking  THIRD Relax  Take a deep breath and then let it out  Try to make the urge go away by using relaxation and visualization techniques  FINALLY When you feel the urge go away somewhat, walk normally to the bathroom.   If the urge gets suddenly stronger on the way, you may stop again and relax to regain control.   Toileting Techniques for Bowel Movements (Defecation) Using your belly (abdomen) and pelvic floor muscles to have a bowel movement is usually instinctive.  Sometimes people can have problems with these muscles and have to relearn proper defecation (emptying) techniques.  If you have weakness in your muscles, organs that are falling out, decreased sensation in your pelvis, or ignore your urge to go, you may find yourself straining to have a bowel movement.  You are straining if you are: . holding your breath or taking in a huge gulp of air and holding it  . keeping your lips and jaw tensed and closed tightly . turning red in the face because of excessive pushing or forcing . developing or worsening your  hemorrhoids . getting faint while pushing . not emptying completely and have to defecate many times a day  If you are straining, you are actually making it harder for yourself to have a bowel movement.  Many people find they are pulling up with the pelvic floor muscles and closing off instead of opening the anus. Due to lack pelvic floor relaxation and coordination the abdominal muscles, one has to work harder to push the feces out.  Many people have never been taught how to defecate efficiently and effectively.  Notice what happens to your body when you are having a bowel movement.   While you are sitting on the toilet pay attention to the following areas: . Jaw and mouth position . Angle of your hips   . Whether your feet touch the ground or not . Arm placement  . Spine position . Waist . Belly tension . Anus (opening of the anal canal)  An Evacuation/Defecation Plan   Here are the 4 basic points:  1. Lean forward enough for your elbows to rest on your knees 2. Support your feet on the floor or use a low stool if your feet don't touch the floor  3. Push out your belly as if you have swallowed a beach ball-you should feel a widening of your waist 4. Open and relax your pelvic floor muscles, rather than tightening around the anus      The following conditions my require modifications to your toileting posture:  . If you have had surgery in the past that limits your back, hip, pelvic, knee or ankle flexibility . Constipation   Your healthcare practitioner may make the following additional suggestions and adjustments:  1) Sit on the toilet  a) Make sure your feet are supported. b) Notice your hip angle and spine position-most people find it effective to lean forward or raise their knees, which can help the muscles around the anus to relax  c) When you lean forward, place your forearms on your thighs for support  2) Relax suggestions a) Breath deeply in through your nose and out slowly through your mouth as if you are smelling the flowers and blowing out the candles. b) To become aware of how to relax your muscles, contracting and releasing muscles can be helpful.  Pull your pelvic floor muscles in tightly by using the image of holding back gas, or closing around the anus (visualize making a circle smaller) and lifting the anus up and in.  Then release the muscles and your anus should drop down and feel open. Repeat 5 times ending with the feeling of relaxation. c) Keep your pelvic floor muscles relaxed; let your belly bulge out. d) The digestive tract starts at  the mouth and ends at the anal opening, so be sure to relax both ends of the tube.  Place your tongue on the roof of your mouth with your teeth separated.  This helps relax your mouth and will help to relax the anus at the same time.  3) Empty (defecation) a) Keep your pelvic floor and sphincter relaxed, then bulge your anal muscles.  Make the anal opening wide.  b) Stick your belly out as if you have swallowed a beach ball. c) Make your belly wall hard using your belly muscles while continuing to breathe. Doing this makes it easier to open your anus. d) Breath out and give a grunt (or try using other sounds such as ahhhh, shhhhh, ohhhh or grrrrrrr).  4) Finish a) As you finish your bowel movement, pull the pelvic floor muscles up and in.  This will leave your anus in the proper place rather than remaining pushed out and down. If you leave your anus pushed out and down, it will start to feel as though that is normal and give you incorrect signals about needing to have a bowel movement.   [facility] [address] [place], [place] [address]

## 2018-04-23 LAB — HM COLONOSCOPY

## 2018-04-29 ENCOUNTER — Encounter: Payer: Self-pay | Admitting: Physician Assistant

## 2018-08-13 ENCOUNTER — Telehealth: Payer: Self-pay | Admitting: Physician Assistant

## 2018-08-13 DIAGNOSIS — J441 Chronic obstructive pulmonary disease with (acute) exacerbation: Secondary | ICD-10-CM

## 2018-08-13 DIAGNOSIS — J42 Unspecified chronic bronchitis: Secondary | ICD-10-CM

## 2018-08-13 NOTE — Telephone Encounter (Signed)
Referral placed.

## 2018-08-13 NOTE — Telephone Encounter (Signed)
Ok for referral?

## 2018-08-13 NOTE — Telephone Encounter (Signed)
Pt called and left a message stating she needed a new referral to [facility], she has not seen them in 5 years so they are treating her as a new patient. She is in need of a new Bi-Pap machine.

## 2019-03-20 ENCOUNTER — Ambulatory Visit (INDEPENDENT_AMBULATORY_CARE_PROVIDER_SITE_OTHER): Payer: Medicare Other | Admitting: Sports Medicine

## 2019-03-20 ENCOUNTER — Encounter: Payer: Self-pay | Admitting: Sports Medicine

## 2019-03-20 DIAGNOSIS — I10 Essential (primary) hypertension: Secondary | ICD-10-CM | POA: Diagnosis not present

## 2019-03-20 DIAGNOSIS — J42 Unspecified chronic bronchitis: Secondary | ICD-10-CM | POA: Diagnosis not present

## 2019-03-20 MED ORDER — AZITHROMYCIN 250 MG PO TABS: ORAL_TABLET | ORAL | 0 refills | Status: DC

## 2019-03-20 MED ORDER — PREDNISONE 50 MG PO TABS: 50.0000 mg | ORAL_TABLET | Freq: Every day | ORAL | 0 refills | Status: DC

## 2019-03-20 MED ORDER — LISINOPRIL-HYDROCHLOROTHIAZIDE 10-12.5 MG PO TABS: 1.0000 | ORAL_TABLET | Freq: Every day | ORAL | 3 refills | Status: DC

## 2019-03-20 MED FILL — LISINOPRIL-HCTZ 10-12.5 MG: 10-12.5 | 30 days supply | Qty: 30 | Fill #0

## 2019-03-20 MED FILL — AZITHROMYCIN 250 MG TABLET: 250 | 5 days supply | Qty: 6 | Fill #0

## 2019-03-20 MED FILL — predniSONE 50 MG TABS: 50 | 5 days supply | Qty: 5 | Fill #0

## 2019-03-20 NOTE — Progress Notes (Signed)
SOB, warm/flush, elevated BP, cough x 1 week. Has DX of COPD, pt states she has stopped smoking and feels a lot of those issues resolved. Pt not taking any of her medications, self discontinued all. Has inhaler, NOT using it   **does reports she took Niacin for 3 weeks- maybe causing some of the flush/earm feelings?

## 2019-03-20 NOTE — Progress Notes (Signed)
Virtual Visit via WebEx/MyChart   I connected with  Marissa Willis  on 03/20/19 via WebEx/MyChart/Doximity Video and verified that I am speaking with the correct person using two identifiers.   I discussed the limitations, risks, security and privacy concerns of performing an evaluation and management service by WebEx/MyChart/Doximity Video, including the higher likelihood of inaccurate diagnosis and treatment, and the availability of in person appointments.  We also discussed the likely need of an additional face to face encounter for complete and high quality delivery of care.  I also discussed with the patient that there may be a patient responsible charge related to this service. The patient expressed understanding and wishes to proceed.  Provider location is either at home or medical facility. Patient location is at their home, different from provider location. People involved in care of the patient during this telehealth encounter were myself, my nurse/medical assistant, and my front office/scheduling team member.  Subjective:    CC: Coughing  HPI: This is a pleasant 63 year old female with COPD, hypertension.  She has stopped all of her medications, for the past week she is had increasing cough, mild shortness of breath, no chest pain, she is having some flushing but no overt fevers, chills, muscle aches, body aches.  Symptoms are mild, persist.  I reviewed the past medical history, family history, social history, surgical history, and allergies today and no changes were needed.  Please see the problem list section below in epic for further details.  Past Medical History: Past Medical History:  Diagnosis Date  . Asthma with COPD ([place])   . Complex partial seizure disorder [facility]) neurologist-  dr Krista Blue   dx 2014  after x2 seiaures march and sept 2014  -- on keppra  . 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 History of  kidney stones   . History of partial seizures    x2 seizure in 2014  dx complex parital seizures started on keppra-- per pt no seizures since off keppra  . OSA on CPAP   . Retained ureteral stent    right side migrated  . SUI (stress urinary incontinence, female)    mild   Past Surgical History: Past Surgical History:  Procedure Laterality Date  . ANAL FISSURE REPAIR  1994  . BLADDER SUSPENSION  1991  . 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 CYSTOSCOPY WITH URETEROSCOPY AND STENT PLACEMENT Right 12/09/2015   Procedure: CYSTOSCOPY WITH retrograde AND STENT PLACEMENT;  Surgeon: Franchot Gallo, MD;  Location: [facility];  Service: Urology;  Laterality: Right;  . CYSTOSCOPY WITH URETEROSCOPY AND STENT PLACEMENT Right 02/23/2016   Procedure: CYSTOSCOPY  AND STENT EXTRACTION;  Surgeon: Franchot Gallo, MD;  Location: [facility];  Service: Urology;  Laterality: Right;  . IR GENERIC HISTORICAL  01/12/2016   IR URETERAL STENT RIGHT NEW ACCESS W/O SEP NEPHROSTOMY CATH 01/12/2016 Corrie Mckusick, DO [facility]-INTER[facility]  . NEPHROLITHOTOMY Right 01/12/2016   Procedure: RIGHT  PERCUTANEOUS NEPHROLITHOTOMY  insertion double j stent flexible cystoscopy  removable right double j stent;  Surgeon: Franchot Gallo, MD;  Location: [facility];  Service: Urology;  Laterality: Right;  . TUBAL LIGATION  1991   Social History: Social History   Socioeconomic History  . Marital status: Married    Spouse name: Liliane Channel  . Number of children: 2  . Years of education:  14  . Highest education level: Not on file  Occupational History  . Not on file  Social Needs  . Financial resource strain: Not on file  . Food insecurity    Worry: Not on file    Inability: Not on file  . Transportation needs    Medical: Not on file    Non-medical: Not on file  Tobacco Use  . Smoking status: Former Smoker    Packs/day: 1.50    Years: 45.00    Pack years:  67.50    Types: Cigarettes    Start date: 07/20/2013    Quit date: 01/09/2014    Years since quitting: 5.1  . Smokeless tobacco: Never Used  Substance and Sexual Activity  . Alcohol use: Yes    Comment: 1-2 drinks daily  . Drug use: Yes    Types: Marijuana  . Sexual activity: Not on file  Lifestyle  . Physical activity    Days per week: Not on file    Minutes per session: Not on file  . Stress: Not on file  Relationships  . Social Herbalist on phone: Not on file    Gets together: Not on file    Attends religious service: Not on file    Active member of club or organization: Not on file    Attends meetings of clubs or organizations: Not on file    Relationship status: Not on file  Other Topics Concern  . Not on file  Social History Narrative   Lives at home with husband Liliane Channel ) and 65 yr old mother.   2 daughters   Pt is Rt handed   Patient has a college education.   Patient drinks a cup of coffee daily and occasionally tea.   Family History: Family History  Problem Relation Age of Onset  . Diabetes Mother   . Heart disease Mother   . Hypertension Mother   . Heart disease Father    Allergies: No Known Allergies Medications: See med rec.  Review of Systems: No fevers, chills, night sweats, weight loss, chest pain, or shortness of breath.   Objective:    General: Speaking full sentences, no audible heavy breathing.  Sounds alert and appropriately interactive.  Appears well.  Face symmetric.  Extraocular movements intact.  Pupils equal and round.  No nasal flaring or accessory muscle use visualized.  No other physical exam performed due to the non-physical nature of this visit.  Impression and Recommendations:    COPD (chronic obstructive pulmonary disease) Off of all medications. For a week she has had cough, mild shortness of breath, flushing. No recorded fevers, no myalgias, no anosmia. I do think she is probably starting a mild COPD exacerbation,  possibly infectious considering constitutional symptoms. Adding prednisone, azithromycin. If she is not better in 1 week we will need to do a physical exam and likely get some labs as well as imaging including chest x-ray and possible echo.  Benign essential hypertension Looking back blood pressures have been elevated on multiple visits and is very elevated today. Adding lisinopril/HCTZ, we can reevaluate her blood pressure at follow-up appointments.   I discussed the above assessment and treatment plan with the patient. The patient was provided an opportunity to ask questions and all were answered. The patient agreed with the plan and demonstrated an understanding of the instructions.   The patient was advised to call back or seek an in-person evaluation if the symptoms worsen or if the condition  fails to improve as anticipated.   I provided 25 minutes of non-face-to-face time during this encounter, 15 minutes of additional time was needed to gather information, review chart, records, communicate/coordinate with staff remotely, troubleshooting the multiple errors that we get every time when trying to do video calls through the electronic medical record, WebEx, and Doximity, restart the encounter multiple times due to instability of the software, as well as complete documentation.   ___________________________________________ Gwen Her. Dianah Field, M.D., ABFM., CAQSM. Primary Care and Sports Medicine  [facility]  Adjunct Professor of [place] [employer] [facility] [facility] [employer] [facility] [employer]

## 2019-03-20 NOTE — Assessment & Plan Note (Signed)
Looking back blood pressures have been elevated on multiple visits and is very elevated today. Adding lisinopril/HCTZ, we can reevaluate her blood pressure at follow-up appointments.

## 2019-03-20 NOTE — Assessment & Plan Note (Signed)
Off of all medications. For a week she has had cough, mild shortness of breath, flushing. No recorded fevers, no myalgias, no anosmia. I do think she is probably starting a mild COPD exacerbation, possibly infectious considering constitutional symptoms. Adding prednisone, azithromycin. If she is not better in 1 week we will need to do a physical exam and likely get some labs as well as imaging including chest x-ray and possible echo.

## 2019-04-17 ENCOUNTER — Other Ambulatory Visit: Payer: Self-pay | Admitting: Neurology

## 2019-04-17 ENCOUNTER — Other Ambulatory Visit: Payer: Self-pay | Admitting: Sports Medicine

## 2019-04-17 DIAGNOSIS — I10 Essential (primary) hypertension: Secondary | ICD-10-CM

## 2019-04-17 MED ORDER — LISINOPRIL-HYDROCHLOROTHIAZIDE 10-12.5 MG PO TABS: 1.0000 | ORAL_TABLET | Freq: Every day | ORAL | 0 refills | Status: DC

## 2019-08-18 ENCOUNTER — Other Ambulatory Visit: Payer: Self-pay | Admitting: Physician Assistant

## 2019-08-18 DIAGNOSIS — I10 Essential (primary) hypertension: Secondary | ICD-10-CM

## 2019-09-08 ENCOUNTER — Ambulatory Visit (INDEPENDENT_AMBULATORY_CARE_PROVIDER_SITE_OTHER): Payer: Medicare Other | Admitting: Physician Assistant

## 2019-09-08 ENCOUNTER — Other Ambulatory Visit: Payer: Self-pay

## 2019-09-08 ENCOUNTER — Encounter: Payer: Self-pay | Admitting: Physician Assistant

## 2019-09-08 VITALS — BP 137/58 | HR 67 | Ht 64.0 in | Wt 191.0 lb

## 2019-09-08 DIAGNOSIS — R0602 Shortness of breath: Secondary | ICD-10-CM

## 2019-09-08 DIAGNOSIS — I10 Essential (primary) hypertension: Secondary | ICD-10-CM | POA: Diagnosis not present

## 2019-09-08 DIAGNOSIS — Z780 Asymptomatic menopausal state: Secondary | ICD-10-CM

## 2019-09-08 DIAGNOSIS — D126 Benign neoplasm of colon, unspecified: Secondary | ICD-10-CM

## 2019-09-08 DIAGNOSIS — Z13820 Encounter for screening for osteoporosis: Secondary | ICD-10-CM

## 2019-09-08 DIAGNOSIS — Z87891 Personal history of nicotine dependence: Secondary | ICD-10-CM

## 2019-09-08 DIAGNOSIS — R413 Other amnesia: Secondary | ICD-10-CM

## 2019-09-08 DIAGNOSIS — E782 Mixed hyperlipidemia: Secondary | ICD-10-CM

## 2019-09-08 DIAGNOSIS — J441 Chronic obstructive pulmonary disease with (acute) exacerbation: Secondary | ICD-10-CM

## 2019-09-08 DIAGNOSIS — R5383 Other fatigue: Secondary | ICD-10-CM

## 2019-09-08 DIAGNOSIS — Z131 Encounter for screening for diabetes mellitus: Secondary | ICD-10-CM

## 2019-09-08 MED ORDER — LISINOPRIL-HYDROCHLOROTHIAZIDE 10-12.5 MG PO TABS: 1.0000 | ORAL_TABLET | Freq: Every day | ORAL | 1 refills | Status: DC

## 2019-09-08 MED ORDER — PITAVASTATIN CALCIUM 1 MG PO TABS: 1.0000 | ORAL_TABLET | Freq: Every day | ORAL | 1 refills | Status: DC

## 2019-09-08 MED ORDER — PREDNISONE 20 MG PO TABS: ORAL_TABLET | ORAL | 0 refills | Status: DC

## 2019-09-08 MED FILL — predniSONE 20 MG TABS: 20 | 13 days supply | Qty: 20 | Fill #0

## 2019-09-08 NOTE — Progress Notes (Signed)
Subjective:    Patient ID: Marissa Willis, female    DOB: 1956-03-25, 64 y.o.   MRN: [id number]  HPI  Patient is a 64 year old female who is a former smoker and suspected COPD without lung function test, hypertension, hyperlipidemia who presents to the clinic for follow-up on medications and above diagnoses.  Patient is doing well with her blood pressure.  She denies any headaches, dizziness, chest pain, palpitations.  Her biggest concern today is recent worsening of shortness of breath.  For the last 6 months she is progressively had more more problems with breathing.  She would get short of breath and winded when going up just a few flights of stairs.  She often feels like something is pressing on her chest.  She does use a BiPAP at night.  She does report that she does not lie flat at night she feels better with being propped up.  She does walk on a treadmill about 1 mile every day and seems to tolerate that better than day today walking.  She uses her albuterol inhaler frequently and does get benefit.  She denies any lower extremity edema.  We have her labeled as COPD due to her smoking history and symptoms but she has not had pulmonary function tests or spirometry.  She did officially quit 2 years ago.  She smoked for 51 years.  She only has albuterol for as needed symptoms.  Today she has more of a productive cough.  She denies any fever, chills, headaches, sore throat, sinus pressure.    .. Active Ambulatory Problems    Diagnosis Date Noted  . COPD (chronic obstructive pulmonary disease) ([place]) 12/26/2012  . Asthma 12/26/2012  . Complex partial seizure ([place]) 03/02/2013  . Former smoker 12/09/2013  . Mixed hyperlipidemia 12/09/2013  . Aortic atherosclerosis ([place]) 12/28/2014  . Bilateral lower extremity edema 07/06/2015  . Renal calculi 12/09/2015  . Kidney calculi 01/12/2016  . Finger fracture, right 08/24/2016  . Bilateral hip pain 09/11/2017  . Epigastric pain 09/11/2017  .  RUQ pain 09/11/2017  . Class 1 obesity due to excess calories without serious comorbidity with body mass index (BMI) of 34.0 to 34.9 in adult 09/11/2017  . Hepatic steatosis 09/16/2017  . Cyst near tailbone 02/17/2018  . Generalized intra-abdominal and pelvic swelling, mass and lump 02/17/2018  . Polyp of sigmoid colon 02/17/2018  . Internal hemorrhoids 02/17/2018  . Diverticulosis 03/04/2018  . Benign essential hypertension 03/20/2019  . Adenomatous polyp of colon 09/08/2019  . Low serum vitamin B12 09/09/2019  . Elevated fasting glucose 09/09/2019  . SOB (shortness of breath) on exertion 09/09/2019  . Memory changes 09/09/2019  . Fatigue 09/09/2019  . Age related osteoporosis 09/09/2019  . Post-menopausal 09/09/2019   Resolved Ambulatory Problems    Diagnosis Date Noted  . Hand injury, right, initial encounter 08/10/2016   Past Medical History:  Diagnosis Date  . Asthma with COPD ([place])   . Complex partial seizure disorder [facility] neurologist-  dr Krista Blue  . History of cerebral aneurysm repair   . History of kidney stones   . History of partial seizures   . OSA on CPAP   . Retained ureteral stent   . SUI (stress urinary incontinence, female)       Review of Systems See HPI.     Objective:   Physical Exam Vitals reviewed.  Constitutional:      Appearance: Normal appearance. She is obese.  HENT:     Head: Normocephalic.  Right Ear: Tympanic membrane normal.     Left Ear: Tympanic membrane normal.     Nose: Nose normal.     Mouth/Throat:     Mouth: Mucous membranes are moist.  Eyes:     Extraocular Movements: Extraocular movements intact.     Conjunctiva/sclera: Conjunctivae normal.     Pupils: Pupils are equal, round, and reactive to light.  Neck:     Vascular: No carotid bruit.  Cardiovascular:     Rate and Rhythm: Normal rate and regular rhythm.     Pulses: Normal pulses.     Heart sounds: No murmur.  Pulmonary:     Comments: Coarse breath sounds.   Expiratory wheezing throughout both lungs on exam.  Abdominal:     General: Bowel sounds are normal. There is no distension.     Palpations: Abdomen is soft.     Tenderness: There is no abdominal tenderness.  Musculoskeletal:     Right lower leg: No edema.     Left lower leg: No edema.  Neurological:     General: No focal deficit present.     Mental Status: She is alert and oriented to person, place, and time.  Psychiatric:        Mood and Affect: Mood normal.           Assessment & Plan:  Marissa KitchenMarland KitchenGaetana was seen today for follow-up.  Diagnoses and all orders for this visit:  SOB (shortness of breath) on exertion -     COMPLETE METABOLIC PANEL WITH GFR -     TSH -     CBC -     Fe+TIBC+Fer -     B12  Benign essential hypertension -     lisinopril-hydrochlorothiazide (ZESTORETIC) 10-12.5 MG tablet; Take 1 tablet by mouth daily. -     COMPLETE METABOLIC PANEL WITH GFR  Adenomatous polyp of colon, unspecified part of colon  Screening for osteoporosis -     DG Bone Density  Mixed hyperlipidemia -     Lipid Panel w/reflex Direct LDL -     Pitavastatin Calcium 1 MG TABS; Take 1 tablet (1 mg total) by mouth at bedtime.  Screening for diabetes mellitus -     COMPLETE METABOLIC PANEL WITH GFR  Fatigue, unspecified type -     TSH -     CBC -     Fe+TIBC+Fer -     B12  Memory changes -     TSH -     CBC -     B12  COPD exacerbation (HCC) -     predniSONE (DELTASONE) 20 MG tablet; Take 3 tablets for 3 days, take 2 tablets for 3 days, take 1 tablet for 3 days, take 1/2 tablet for 4 days.  Post-menopausal   Refilled medications needed today.  Ordered labs to evaluate screening, fatigue, SOB.   Treated for COPD exacerbation with prednisone. Vitals reassuring at 96 percent.  Pt would qualify for CT low dose screening due to 51 pack year hx and former smoker.   For SOB- evaluate with echo. No signs of ext edema today. Come back in 2 weeks for spironmetry to test  lung function. Suspect needs to start a daily inhaler to help with lung function.   Will recheck lipid but with your history you need to be on statin. livalo is more tolerable. Sent to pharmacy.   Per patient done colonoscopy-sent for records.  Scheduled for mammogram and ordered bone density.   Follow up after  echo and will do spironmetry here in office.

## 2019-09-09 ENCOUNTER — Encounter: Payer: Self-pay | Admitting: Physician Assistant

## 2019-09-09 DIAGNOSIS — M810 Age-related osteoporosis without current pathological fracture: Secondary | ICD-10-CM

## 2019-09-09 DIAGNOSIS — R413 Other amnesia: Secondary | ICD-10-CM | POA: Insufficient documentation

## 2019-09-09 DIAGNOSIS — Z780 Asymptomatic menopausal state: Secondary | ICD-10-CM | POA: Insufficient documentation

## 2019-09-09 DIAGNOSIS — R7301 Impaired fasting glucose: Secondary | ICD-10-CM | POA: Insufficient documentation

## 2019-09-09 DIAGNOSIS — R5383 Other fatigue: Secondary | ICD-10-CM | POA: Insufficient documentation

## 2019-09-09 DIAGNOSIS — E538 Deficiency of other specified B group vitamins: Secondary | ICD-10-CM | POA: Insufficient documentation

## 2019-09-09 DIAGNOSIS — R0602 Shortness of breath: Secondary | ICD-10-CM | POA: Insufficient documentation

## 2019-09-09 HISTORY — DX: Age-related osteoporosis without current pathological fracture: M81.0

## 2019-09-09 HISTORY — DX: Other amnesia: R41.3

## 2019-09-09 NOTE — Progress Notes (Signed)
Marissa Willis,   B12 is low. We could start with b12 shot in office 1036mcg and then start taking 1038mcg daily.  Make sure you are also taking 1000units of D3 OTC daily with this.  Recheck in 3 months.   Iron, iron stores and hemoglobin look good.   Thyroid looks perfect and kidney look perfect.   Your glucose was up some we need to get A!C to evaluate for diabetes.  Your liver enzyme was a little up but better than the past and not concerning.   Your cholesterol was a better than 1 year ago but still not to goal. Start livalo and recheck in 4-6 months.

## 2019-09-11 LAB — COMPLETE METABOLIC PANEL WITH GFR
AG Ratio: 1.6 (calc) (ref 1.0–2.5)
ALT: 45 U/L — ABNORMAL HIGH (ref 6–29)
AST: 29 U/L (ref 10–35)
Albumin: 4.2 g/dL (ref 3.6–5.1)
Alkaline phosphatase (APISO): 57 U/L (ref 37–153)
BUN: 16 mg/dL (ref 7–25)
CO2: 29 mmol/L (ref 20–32)
Calcium: 9.8 mg/dL (ref 8.6–10.4)
Chloride: 102 mmol/L (ref 98–110)
Creat: 0.88 mg/dL (ref 0.50–0.99)
GFR, Est African American: 79 mL/min/{1.73_m2} (ref >=60)
GFR, Est Non African American: 68 mL/min/{1.73_m2} (ref >=60)
Globulin: 2.6 g/dL (calc) (ref 1.9–3.7)
Glucose, Bld: 105 mg/dL — ABNORMAL HIGH (ref 65–99)
Potassium: 4.5 mmol/L (ref 3.5–5.3)
Sodium: 138 mmol/L (ref 135–146)
Total Bilirubin: 0.5 mg/dL (ref 0.2–1.2)
Total Protein: 6.8 g/dL (ref 6.1–8.1)

## 2019-09-11 LAB — CBC
HCT: 43 % (ref 35.0–45.0)
Hemoglobin: 14.6 g/dL (ref 11.7–15.5)
MCH: 33 pg (ref 27.0–33.0)
MCHC: 34 g/dL (ref 32.0–36.0)
MCV: 97.3 fL (ref 80.0–100.0)
MPV: 10.1 fL (ref 7.5–12.5)
Platelets: 325 10*3/uL (ref 140–400)
RBC: 4.42 10*6/uL (ref 3.80–5.10)
RDW: 12.7 % (ref 11.0–15.0)
WBC: 8.3 10*3/uL (ref 3.8–10.8)

## 2019-09-11 LAB — LIPID PANEL W/REFLEX DIRECT LDL
Cholesterol: 233 mg/dL — ABNORMAL HIGH (ref <200)
HDL: 47 mg/dL — ABNORMAL LOW (ref >=50)
LDL Cholesterol (Calc): 156 mg/dL (calc) — ABNORMAL HIGH
Non-HDL Cholesterol (Calc): 186 mg/dL (calc) — ABNORMAL HIGH (ref <130)
Total CHOL/HDL Ratio: 5 (calc) — ABNORMAL HIGH (ref <5.0)
Triglycerides: 160 mg/dL — ABNORMAL HIGH (ref <150)

## 2019-09-11 LAB — TSH: TSH: 1.91 mIU/L (ref 0.40–4.50)

## 2019-09-11 LAB — IRON,TIBC AND FERRITIN PANEL
%SAT: 25 % (calc) (ref 16–45)
Ferritin: 45 ng/mL (ref 16–288)
Iron: 94 ug/dL (ref 45–160)
TIBC: 377 mcg/dL (calc) (ref 250–450)

## 2019-09-11 LAB — HEMOGLOBIN A1C W/OUT EAG: Hgb A1c MFr Bld: 5.3 % of total Hgb (ref <5.7)

## 2019-09-11 LAB — VITAMIN B12: Vitamin B-12: 275 pg/mL (ref 200–1100)

## 2019-09-11 NOTE — Progress Notes (Signed)
Great news.   You are not a diabetic. A1C in normal range.

## 2019-09-16 ENCOUNTER — Other Ambulatory Visit: Payer: Self-pay

## 2019-09-16 ENCOUNTER — Ambulatory Visit (INDEPENDENT_AMBULATORY_CARE_PROVIDER_SITE_OTHER): Payer: Medicare Other

## 2019-09-16 ENCOUNTER — Other Ambulatory Visit: Payer: Self-pay | Admitting: Physician Assistant

## 2019-09-16 ENCOUNTER — Ambulatory Visit (HOSPITAL_BASED_OUTPATIENT_CLINIC_OR_DEPARTMENT_OTHER)
Admission: RE | Admit: 2019-09-16 | Discharge: 2019-09-16 | Disposition: A | Discharge status: Home / Self Care | Payer: Medicare Other | Source: Ambulatory Visit | Attending: Physician Assistant | Admitting: Physician Assistant

## 2019-09-16 DIAGNOSIS — R0602 Shortness of breath: Secondary | ICD-10-CM | POA: Diagnosis present

## 2019-09-16 DIAGNOSIS — Z780 Asymptomatic menopausal state: Secondary | ICD-10-CM | POA: Diagnosis not present

## 2019-09-16 NOTE — Progress Notes (Signed)
  Echocardiogram 2D Echocardiogram has been performed.  Marissa Willis 09/16/2019, 1:41 PM

## 2019-09-21 ENCOUNTER — Encounter: Payer: Self-pay | Admitting: Physician Assistant

## 2019-09-21 DIAGNOSIS — M8580 Other specified disorders of bone density and structure, unspecified site: Secondary | ICD-10-CM | POA: Insufficient documentation

## 2019-09-22 ENCOUNTER — Other Ambulatory Visit: Payer: Self-pay

## 2019-09-22 ENCOUNTER — Encounter: Payer: Self-pay | Admitting: Physician Assistant

## 2019-09-22 ENCOUNTER — Ambulatory Visit (INDEPENDENT_AMBULATORY_CARE_PROVIDER_SITE_OTHER): Payer: Medicare Other | Admitting: Physician Assistant

## 2019-09-22 VITALS — BP 107/63 | HR 70 | Temp 98.8°F | Ht 64.0 in | Wt 188.0 lb

## 2019-09-22 DIAGNOSIS — I517 Cardiomegaly: Secondary | ICD-10-CM

## 2019-09-22 DIAGNOSIS — Z87891 Personal history of nicotine dependence: Secondary | ICD-10-CM | POA: Diagnosis not present

## 2019-09-22 DIAGNOSIS — I700 Atherosclerosis of aorta: Secondary | ICD-10-CM | POA: Diagnosis not present

## 2019-09-22 DIAGNOSIS — J449 Chronic obstructive pulmonary disease, unspecified: Secondary | ICD-10-CM

## 2019-09-22 MED ORDER — TRELEGY ELLIPTA 100-62.5-25 MCG/INH IN AEPB: 1.0000 | INHALATION_SPRAY | Freq: Every day | RESPIRATORY_TRACT | 5 refills | Status: DC

## 2019-09-22 MED ORDER — ALBUTEROL SULFATE HFA 108 (90 BASE) MCG/ACT IN AERS
2.0000 | INHALATION_SPRAY | Freq: Once | RESPIRATORY_TRACT | Status: AC
  Administered 2019-09-22: 2 via RESPIRATORY_TRACT

## 2019-09-22 MED ORDER — PITAVASTATIN MAGNESIUM 2 MG PO TABS: 1.0000 | ORAL_TABLET | Freq: Every day | ORAL | 1 refills | Status: DC

## 2019-09-22 NOTE — Progress Notes (Signed)
Patient presents to clinic for a spirometry test. She reports she is a former smoker and has shortness of breath. She tolerated the procedure well.

## 2019-09-22 NOTE — Patient Instructions (Signed)
Chronic Obstructive Pulmonary Disease Chronic obstructive pulmonary disease (COPD) is a long-term (chronic) lung problem. When you have COPD, it is hard for air to get in and out of your lungs. Usually the condition gets worse over time, and your lungs will never return to normal. There are things you can do to keep yourself as healthy as possible.  Your doctor may treat your condition with: ? Medicines. ? Oxygen. ? Lung surgery.  Your doctor may also recommend: ? Rehabilitation. This includes steps to make your body work better. It may involve a team of specialists. ? Quitting smoking, if you smoke. ? Exercise and changes to your diet. ? Comfort measures (palliative care). Follow these instructions at home: Medicines  Take over-the-counter and prescription medicines only as told by your doctor.  Talk to your doctor before taking any cough or allergy medicines. You may need to avoid medicines that cause your lungs to be dry. Lifestyle  If you smoke, stop. Smoking makes the problem worse. If you need help quitting, ask your doctor.  Avoid being around things that make your breathing worse. This may include smoke, chemicals, and fumes.  Stay active, but remember to rest as well.  Learn and use tips on how to relax.  Make sure you get enough sleep. Most adults need at least 7 hours of sleep every night.  Eat healthy foods. Eat smaller meals more often. Rest before meals. Controlled breathing Learn and use tips on how to control your breathing as told by your doctor. Try:  Breathing in (inhaling) through your nose for 1 second. Then, pucker your lips and breath out (exhale) through your lips for 2 seconds.  Putting one hand on your belly (abdomen). Breathe in slowly through your nose for 1 second. Your hand on your belly should move out. Pucker your lips and breathe out slowly through your lips. Your hand on your belly should move in as you breathe out.  Controlled coughing Learn  and use controlled coughing to clear mucus from your lungs. Follow these steps: 1. Lean your head a little forward. 2. Breathe in deeply. 3. Try to hold your breath for 3 seconds. 4. Keep your mouth slightly open while coughing 2 times. 5. Spit any mucus out into a tissue. 6. Rest and do the steps again 1 or 2 times as needed. General instructions  Make sure you get all the shots (vaccines) that your doctor recommends. Ask your doctor about a flu shot and a pneumonia shot.  Use oxygen therapy and pulmonary rehabilitation if told by your doctor. If you need home oxygen therapy, ask your doctor if you should buy a tool to measure your oxygen level (oximeter).  Make a COPD action plan with your doctor. This helps you to know what to do if you feel worse than usual.  Manage any other conditions you have as told by your doctor.  Avoid going outside when it is very hot, cold, or humid.  Avoid people who have a sickness you can catch (contagious).  Keep all follow-up visits as told by your doctor. This is important. Contact a doctor if:  You cough up more mucus than usual.  There is a change in the color or thickness of the mucus.  It is harder to breathe than usual.  Your breathing is faster than usual.  You have trouble sleeping.  You need to use your medicines more often than usual.  You have trouble doing your normal activities such as getting dressed   or walking around the house. Get help right away if:  You have shortness of breath while resting.  You have shortness of breath that stops you from: ? Being able to talk. ? Doing normal activities.  Your chest hurts for longer than 5 minutes.  Your skin color is more blue than usual.  Your pulse oximeter shows that you have low oxygen for longer than 5 minutes.  You have a fever.  You feel too tired to breathe normally. Summary  Chronic obstructive pulmonary disease (COPD) is a long-term lung problem.  The way your  lungs work will never return to normal. Usually the condition gets worse over time. There are things you can do to keep yourself as healthy as possible.  Take over-the-counter and prescription medicines only as told by your doctor.  If you smoke, stop. Smoking makes the problem worse. This information is not intended to replace advice given to you by your health care provider. Make sure you discuss any questions you have with your health care provider. Document Revised: 05/17/2017 Document Reviewed: 07/09/2016 Elsevier Patient Education  2020 Elsevier Inc.  

## 2019-09-22 NOTE — Progress Notes (Signed)
Subjective:    Patient ID: Marissa Willis, female    DOB: Oct 19, 1955, 64 y.o.   MRN: [id number]  HPI  Pt is a 64 yo former smoker with aortic atherosclerosis, OSA, HTN who complains of SOB worsening. She finished a prednisone taper which made her feel much better. She is here for spirometry.   She has not been called for lung cancer screening CT.   Not able to afford livalo. Needs something else.    .. Active Ambulatory Problems    Diagnosis Date Noted  . Stage 3 severe COPD by GOLD classification ([place]) 12/26/2012  . Asthma 12/26/2012  . Complex partial seizure ([place]) 03/02/2013  . Former smoker 12/09/2013  . Mixed hyperlipidemia 12/09/2013  . Aortic atherosclerosis ([place]) 12/28/2014  . Bilateral lower extremity edema 07/06/2015  . Renal calculi 12/09/2015  . Kidney calculi 01/12/2016  . Finger fracture, right 08/24/2016  . Bilateral hip pain 09/11/2017  . Epigastric pain 09/11/2017  . RUQ pain 09/11/2017  . Class 1 obesity due to excess calories without serious comorbidity with body mass index (BMI) of 34.0 to 34.9 in adult 09/11/2017  . Hepatic steatosis 09/16/2017  . Cyst near tailbone 02/17/2018  . Generalized intra-abdominal and pelvic swelling, mass and lump 02/17/2018  . Polyp of sigmoid colon 02/17/2018  . Internal hemorrhoids 02/17/2018  . Diverticulosis 03/04/2018  . Benign essential hypertension 03/20/2019  . Adenomatous polyp of colon 09/08/2019  . Low serum vitamin B12 09/09/2019  . Elevated fasting glucose 09/09/2019  . SOB (shortness of breath) on exertion 09/09/2019  . Memory changes 09/09/2019  . Fatigue 09/09/2019  . Age related osteoporosis 09/09/2019  . Post-menopausal 09/09/2019  . Osteopenia 09/21/2019  . Left ventricular hypertrophy 09/22/2019   Resolved Ambulatory Problems    Diagnosis Date Noted  . Hand injury, right, initial encounter 08/10/2016   Past Medical History:  Diagnosis Date  . Asthma with COPD ([place])   . Complex partial  seizure disorder [facility] neurologist-  dr Krista Blue  . History of cerebral aneurysm repair   . History of kidney stones   . History of partial seizures   . OSA on CPAP   . Retained ureteral stent   . SUI (stress urinary incontinence, female)       Review of Systems    see HPI.  Objective:   Physical Exam Vitals reviewed.  Constitutional:      Appearance: Normal appearance. She is obese.  Cardiovascular:     Rate and Rhythm: Normal rate and regular rhythm.     Pulses: Normal pulses.     Heart sounds: Normal heart sounds.  Pulmonary:     Effort: Pulmonary effort is normal.     Breath sounds: Normal breath sounds.  Musculoskeletal:     Cervical back: Normal range of motion and neck supple.  Neurological:     General: No focal deficit present.     Mental Status: She is alert and oriented to person, place, and time.  Psychiatric:        Mood and Affect: Mood normal.           Assessment & Plan:  [name] KitchenMarland KitchenTangelia was seen today for spirometry.  Diagnoses and all orders for this visit:  Stage 3 severe COPD by GOLD classification ([place]) -     Fluticasone-Umeclidin-Vilant (TRELEGY ELLIPTA) 100-62.5-25 MCG/INH AEPB; Inhale 1 puff into the lungs daily. -     albuterol (VENTOLIN HFA) 108 (90 Base) MCG/ACT inhaler 2 puff  Left ventricular hypertrophy  Former smoker  Aortic atherosclerosis (HCC) -     Pitavastatin Magnesium 2 MG TABS; Take 1 tablet by mouth daily.   Sent livalo to [place] drug to start.   Discussed echo. Normal EF. Some LVH. Continue to monitor BP. BP controlled today.   Will send note for CT low dose lung screening.   Discussed SOB coming from COPD.  FEV1 41percent.  FVC was 61 percent.   Severe obstruction.   Pulse ox 97 percent today.  Start trelegy one puff daily.  Albuterol as needed.  Follow up in 3 months.  Discussed pulmonary rehab and lung exercises.

## 2019-09-22 NOTE — Progress Notes (Signed)
Reviewed with patient in office today

## 2019-09-24 ENCOUNTER — Telehealth: Payer: Self-pay | Admitting: Neurology

## 2019-09-24 NOTE — Telephone Encounter (Signed)
Patient left vm that Trelegy Ellipta had a copay of $485 dollars. I tried to sign patient up for copay card online, but she does not qualify since she is over 77, even though she has Pharmacist, community.   Patient is asking for an alternative. Please advise.

## 2019-09-25 MED ORDER — BEVESPI AEROSPHERE 9-4.8 MCG/ACT IN AERO: 2.0000 | INHALATION_SPRAY | Freq: Two times a day (BID) | RESPIRATORY_TRACT | 2 refills | Status: DC

## 2019-09-25 NOTE — Telephone Encounter (Signed)
Patient made aware.

## 2019-09-25 NOTE — Telephone Encounter (Signed)
I sent bevsepi to replace trelegy.

## 2019-09-28 NOTE — Telephone Encounter (Signed)
Patient states copay on this drug is $415. She wants to know if anything older would be better?

## 2019-09-30 NOTE — Telephone Encounter (Signed)
We are going to have to go through medicare D program and get her set up with nebulizer and budesonide solution. Do you have that form to order all the supplies through company?

## 2019-10-01 NOTE — Telephone Encounter (Signed)
Placed in [employer] [place] for completion.

## 2019-10-01 NOTE — Telephone Encounter (Signed)
Patient made aware. I do have forms for Marissa Willis to complete.

## 2019-10-02 NOTE — Telephone Encounter (Signed)
Faxed to [employer] to [phone] with confirmation received.

## 2019-10-02 NOTE — Telephone Encounter (Signed)
Signed.

## 2019-10-05 ENCOUNTER — Encounter: Payer: Self-pay | Admitting: Physician Assistant

## 2019-10-07 ENCOUNTER — Encounter: Payer: Self-pay | Admitting: Physician Assistant

## 2019-10-08 NOTE — Addendum Note (Signed)
Addended byAnnamaria Helling on: 10/08/2019 01:26 PM   Modules accepted: Orders

## 2019-10-19 NOTE — Progress Notes (Signed)
I called Marissa Willis and they are calling to get patient today to get her on the schedule. - CF

## 2019-12-14 ENCOUNTER — Telehealth: Payer: Self-pay | Admitting: Neurology

## 2019-12-14 DIAGNOSIS — E1169 Type 2 diabetes mellitus with other specified complication: Secondary | ICD-10-CM

## 2019-12-14 DIAGNOSIS — I10 Essential (primary) hypertension: Secondary | ICD-10-CM

## 2019-12-14 MED ORDER — LISINOPRIL-HYDROCHLOROTHIAZIDE 10-12.5 MG PO TABS: 1.0000 | ORAL_TABLET | Freq: Every day | ORAL | 1 refills | Status: DC

## 2019-12-14 NOTE — Telephone Encounter (Signed)
Patient called for a couple reasons.  1. She states she is not happy with [facility] order pharmacy and wanted Lisinopril sent to [facility]. Done.   2. She wants to know if cholesterol medication is working and wanted order to get lab repeated. According to last result note she was to have labs rechecked 4-6 months after starting medication at the end of March. Order placed but I will let patient know to wait til at least 4 months.   3. She had stated she is due for repeat breathing evaluation on 12/22/2019 but she doesn't feel like she needs it because she hasn't been on breathing medications. It looks like she is just scheduled for regular 3 month follow up. Please advise.

## 2019-12-14 NOTE — Telephone Encounter (Signed)
Tried to call with no answer and no vm.

## 2019-12-14 NOTE — Telephone Encounter (Signed)
Yes wait for 4 months before starts cholesterol medication.  Please start inhalers so we can see how you are breathing. Did you get it filled?

## 2019-12-15 NOTE — Telephone Encounter (Signed)
Spoke with patient. Made her aware to wait at least another month before having labs drawn.   She states nebulizer/inhalers were too expensive and she never heard from [employer].   Called and LMOM with [employer] to call me back to let me know they received order we sent and what patient needs to do to get medication [phone]). Awaiting call back.

## 2019-12-15 NOTE — Telephone Encounter (Signed)
[employer] states they had marked order that we gave her a nebulizer in the office. Cleared that up and they are shipping nebulizer/supplies to patient. Patient made aware. She will delay follow up appt for one month to see her after she gets medications.

## 2019-12-22 ENCOUNTER — Ambulatory Visit: Payer: Medicare Other | Admitting: Physician Assistant

## 2019-12-23 ENCOUNTER — Telehealth: Payer: Self-pay | Admitting: Neurology

## 2019-12-23 NOTE — Telephone Encounter (Signed)
Patient left vm stating she received nebulizer but no medication. Will call company and contact patient back.

## 2019-12-24 NOTE — Telephone Encounter (Signed)
Called [employer] and they state they do not supply medications, just the nebulizer and supplies. I know we sent here due to patient being unable to afford inhalers. Please advise next steps.

## 2019-12-25 MED ORDER — BUDESONIDE 0.25 MG/2ML IN SUSP: 0.2500 mg | Freq: Two times a day (BID) | RESPIRATORY_TRACT | 5 refills | Status: DC

## 2019-12-25 NOTE — Telephone Encounter (Signed)
Patient made aware.

## 2019-12-25 NOTE — Telephone Encounter (Signed)
I sent budesonide to use with nebulizer twice a day.

## 2019-12-28 ENCOUNTER — Telehealth: Payer: Self-pay | Admitting: Physician Assistant

## 2019-12-28 MED FILL — BUDESONIDE 0.25 MG/2ML SUSP: 0.25 | 30 days supply | Qty: 60 | Fill #0

## 2019-12-28 NOTE — Telephone Encounter (Signed)
Received fax 7/9 for PA on Budesonide sent through cover my meds waiting on determination. - CF

## 2019-12-30 NOTE — Telephone Encounter (Signed)
Received fax from [facility] they denied coverage under part D for Budesonide stated it may be covered under part B placing in providers box for review. - CF

## 2020-01-15 ENCOUNTER — Telehealth: Payer: Self-pay

## 2020-01-15 NOTE — Telephone Encounter (Signed)
[facility] billing trailer for iron.   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)  D50.8 Other iron deficiency anemias  D50.9 Iron deficiency anemia, unspecified  D51.0 Vitamin B12 deficiency anemia due to intrinsic factor deficiency  D51.8 Other vitamin B12 deficiency anemias  D51.9 Vitamin B12 deficiency anemia, unspecified  D52.9 Folate deficiency anemia, unspecified  D53.9 Nutritional anemia, unspecified  D63.1 Anemia in chronic kidney disease  D64.9 Anemia, unspecified  E11.22 Type 2 diabetes mellitus with diabetic chronic kidney disease  E11.65 Type 2 diabetes mellitus with hyperglycemia  E11.9 Type 2 diabetes mellitus without complications  V69.7 Iron deficiency  I50.9 Heart failure, unspecified  M25.50 Pain in unspecified joint  N18.4 Chronic kidney disease, stage 4 (severe)  N18.9 Chronic kidney disease, unspecified  R79.89 Other specified abnormal findings of blood chemistry  R79.9 Abnormal finding of blood chemistry, unspecified   Last updated 06/19/2019

## 2020-01-18 NOTE — Telephone Encounter (Signed)
Other b12 anemias

## 2020-01-26 NOTE — Telephone Encounter (Signed)
Added

## 2020-04-21 LAB — HM COLONOSCOPY

## 2020-06-24 ENCOUNTER — Other Ambulatory Visit: Payer: Self-pay | Admitting: Physician Assistant

## 2020-06-24 DIAGNOSIS — I10 Essential (primary) hypertension: Secondary | ICD-10-CM

## 2020-07-04 ENCOUNTER — Encounter: Payer: Self-pay | Admitting: Physician Assistant

## 2020-08-19 ENCOUNTER — Ambulatory Visit (INDEPENDENT_AMBULATORY_CARE_PROVIDER_SITE_OTHER): Payer: Medicare Other | Admitting: Physician Assistant

## 2020-08-19 ENCOUNTER — Other Ambulatory Visit: Payer: Self-pay

## 2020-08-19 ENCOUNTER — Encounter: Payer: Self-pay | Admitting: Physician Assistant

## 2020-08-19 VITALS — BP 112/63 | HR 76 | Ht 64.0 in | Wt 191.0 lb

## 2020-08-19 DIAGNOSIS — E785 Hyperlipidemia, unspecified: Secondary | ICD-10-CM

## 2020-08-19 DIAGNOSIS — J449 Chronic obstructive pulmonary disease, unspecified: Secondary | ICD-10-CM | POA: Diagnosis not present

## 2020-08-19 DIAGNOSIS — E1169 Type 2 diabetes mellitus with other specified complication: Secondary | ICD-10-CM | POA: Diagnosis not present

## 2020-08-19 DIAGNOSIS — I10 Essential (primary) hypertension: Secondary | ICD-10-CM | POA: Diagnosis not present

## 2020-08-19 DIAGNOSIS — I700 Atherosclerosis of aorta: Secondary | ICD-10-CM

## 2020-08-19 DIAGNOSIS — K6289 Other specified diseases of anus and rectum: Secondary | ICD-10-CM

## 2020-08-19 DIAGNOSIS — I739 Peripheral vascular disease, unspecified: Secondary | ICD-10-CM

## 2020-08-19 MED ORDER — LISINOPRIL-HYDROCHLOROTHIAZIDE 10-12.5 MG PO TABS: 1.0000 | ORAL_TABLET | Freq: Every day | ORAL | 3 refills | Status: DC

## 2020-08-19 MED ORDER — PITAVASTATIN MAGNESIUM 2 MG PO TABS: 1.0000 | ORAL_TABLET | Freq: Every day | ORAL | 3 refills | Status: DC

## 2020-08-19 MED ORDER — DULOXETINE HCL 30 MG PO CPEP: 30.0000 mg | ORAL_CAPSULE | Freq: Every day | ORAL | 0 refills | Status: DC

## 2020-08-19 MED ORDER — PROAIR RESPICLICK 108 (90 BASE) MCG/ACT IN AEPB: 2.0000 | INHALATION_SPRAY | Freq: Four times a day (QID) | RESPIRATORY_TRACT | 1 refills | Status: DC | PRN

## 2020-08-19 NOTE — Patient Instructions (Addendum)
Will get ABI of bilateral legs.    Peripheral Vascular Disease  Peripheral vascular disease (PVD) is a disease of the blood vessels. PVD may also be called peripheral artery disease (PAD) or poor circulation. PVD is the blocking or hardening of the arteries anywhere within the circulatory system beyond the heart. This can result in a decreased supply of blood to the arms, legs, and internal organs, such as the stomach or kidneys. However, PVD most often affects a person's lower legs and feet. Without treatment, PVD often worsens. PVD can lead to acute limb ischemia. This occurs when an arm or leg suddenly has trouble getting enough blood. This is a medical emergency. What are the causes? The most common cause of PVD is atherosclerosis. This is a buildup of fatty material and other substances (plaque)inside your arteries. Pieces of plaque can break off from the walls of an artery and become stuck in a smaller artery, blocking blood flow and possibly causing acute limb ischemia. Other common causes of PVD include:  Blood clots that form inside the blood vessels.  Injuries to blood vessels.  Diseases that cause inflammation of blood vessels or cause blood vessel tightening (spasms). What increases the risk? The following factors may make you more likely to develop this condition:  A family history of PVD.  Common medical conditions, including: ? High cholesterol. ? Diabetes. ? High blood pressure (hypertension). ? Heart disease. ? Known atherosclerotic disease in another area of the body. ? Past injury, such as burns or a broken bone.  Other medical conditions, such as: ? Buerger's disease. This is caused by inflamed blood vessels in your hands and feet. ? Some forms of arthritis. ? Birth defects that affect the arteries in your legs. ? Kidney disease.  Using tobacco and nicotine products.  Not getting enough exercise.  Obesity.  Being age 55 or older, or being age 36 or older  and having the other risk factors. What are the signs or symptoms? This condition may cause different symptoms. Your symptoms depend on what body part is not getting enough blood. Common signs and symptoms include:  Cramps in your buttocks, legs, and feet.  Intermittent claudication. This is pain and weakness in your legs during activity that resolves with rest.  Leg pain at rest and leg numbness, tingling, or weakness.  Coldness in a leg or foot, especially when compared to the other leg or foot.  Skin or hair changes. These can include: ? Hair loss. ? Shiny skin. ? Pale or bluish skin. ? Thick toenails.  Inability to get or maintain an erection (erectile dysfunction).  Tiredness (fatigue).  Weak pulse or no pulse in the feet. People with PVD are more likely to develop open wounds (ulcers) and sores on their toes, feet, or legs. The ulcers or sores may take longer than normal to heal. How is this diagnosed? PVD is diagnosed based on your signs and symptoms, a physical exam, and your medical history. You may also have other tests to find the cause. Tests include:  Ankle-brachial index test.This test compares the blood pressure readings of the legs and arms. ? This may also include an exercise ankle-brachial index test in which you walk on a treadmill to check your symptoms.  Doppler ultrasound. This takes pictures of blood flow through your blood vessels.  Imaging studies that use dye to show blood flow. These are: ? CT angiogram. ? Magnetic resonance angiogram, or MRA. How is this treated? Treatment for PVD depends on  the cause of your condition, how severe your symptoms are, and your age. Underlying causes need to be treated and controlled. These include long-term (chronic) conditions, such as diabetes, high cholesterol, and hypertension. Treatment may include:  Lifestyle changes, such as: ? Quitting tobacco use. ? Exercising regularly. ? Following a low-fat,  low-cholesterol diet. ? Not drinking alcohol.  Taking medicines, such as: ? Blood thinners to prevent blood clots. ? Medicines to improve blood flow. ? Medicines to improve cholesterol levels.  Procedures, such as: ? Angioplasty. This uses an inflated balloon to open a blocked artery and improve blood flow. ? Stent implant. This inserts a small mesh tube to keep a blocked artery open. ? Peripheral bypass surgery. This reroutes blood flow around a blocked artery. ? Surgery to remove dead tissue from an infected wound (debridement). ? Amputation. This is surgical removal of the affected limb. It may be necessary in cases of acute limb ischemia when medical or surgical treatments have not helped. Follow these instructions at home: Medicines  Take over-the-counter and prescription medicines only as told by your health care provider.  If you are taking blood thinners: ? Talk with your health care provider before you take any medicines that contain aspirin or NSAIDs, such as ibuprofen. These medicines increase your risk for dangerous bleeding. ? Take your medicine exactly as told, at the same time every day. ? Avoid activities that could cause injury or bruising, and follow instructions about how to prevent falls. ? Wear a medical alert bracelet or carry a card that lists what medicines you take. Lifestyle  Exercise regularly. Ask your health care provider about some good activities for you.  Talk with your health care provider about maintaining a healthy weight. If needed, ask about losing weight.  Eat a diet that is low in fat and cholesterol. If you need help, talk with your health care provider.  Do not drink alcohol.  Do not use any products that contain nicotine or tobacco. These products include cigarettes, chewing tobacco, and vaping devices, such as e-cigarettes. If you need help quitting, ask your health care provider.      General instructions  Take good care of your feet.  To do this: ? Wear comfortable shoes that fit well. ? Check your feet often for any cuts or sores.  Get an annual influenza vaccine.  Keep all follow-up visits. This is important. Where to find more information  Society for Vascular Surgery: vascular.org  American Heart Association: heart.org  National Heart, Lung, and Blood Institute: https://www.[place].biz/ Contact a health care provider if:  You have leg cramps while walking.  You have leg pain when you rest.  Your leg or foot feels cold.  Your skin changes color.  You have erectile dysfunction.  You have cuts or sores on your legs or feet that do not heal. Get help right away if:  You have sudden changes in color and feeling of your arms or legs, such as: ? Your arm or leg turns cold, numb, and blue. ? Your arm or leg becomes red, warm, swollen, painful, or numb.  You have any symptoms of a stroke. "BE FAST" is an easy way to remember the main warning signs of a stroke: ? B - Balance. Signs are dizziness, sudden trouble walking, or loss of balance. ? E - Eyes. Signs are trouble seeing or a sudden change in vision. ? F - Face. Signs are sudden weakness or numbness of the face, or the face or eyelid  drooping on one side. ? A - Arms. Signs are weakness or numbness in an arm. This happens suddenly and usually on one side of the body. ? S - Speech. Signs are sudden trouble speaking, slurred speech, or trouble understanding what people say. ? T - Time. Time to call emergency services. Write down what time symptoms started.  You have other signs of a stroke, such as: ? A sudden, severe headache with no known cause. ? Nausea or vomiting. ? Seizure.  You have chest pain or trouble breathing. These symptoms may represent a serious problem that is an emergency. Do not wait to see if the symptoms will go away. Get medical help right away. Call your local emergency services (911 in the [place] Do not drive yourself to the  hospital. Summary  Peripheral vascular disease (PVD) is a disease of the blood vessels.  PVD is the blocking or hardening of the arteries anywhere within the circulatory system beyond the heart.  PVD may cause different symptoms. Your symptoms depend on what part of your body is not getting enough blood.  Treatment for PVD depends on what caused it, how severe your symptoms are, and your age. This information is not intended to replace advice given to you by your health care provider. Make sure you discuss any questions you have with your health care provider. Document Revised: 12/07/2019 Document Reviewed: 12/07/2019 Elsevier Patient Education  [place].

## 2020-08-19 NOTE — Progress Notes (Signed)
Subjective:    Patient ID: Marissa Willis, female    DOB: 02-23-1956, 65 y.o.   MRN: [id number]  HPI  Patient is a 65 year old female with aortic atherosclerosis, hypertension, COPD stage III, former smoker, asthma, HLD who presents to the clinic for follow up.   COPD and breathing seems to be fairly well managed.  She has Pulmicort and albuterol.  She admits she is not using either regularly.  She has stopped smoking.  She only smokes weed.  She is not exercising but finds it manageable to do most tasks.  Patient does admit to not taking her cholesterol medication.  She ran out and has not got it refilled.  She was able to tolerate Livalo.  She does mention having some bilateral leg cramping and pain with walking.  Goes away at rest.  Patient denies any chest pain, palpitations, headache, vision changes, dizziness.  She is taking her lisinopril/HCTZ daily.  Patient has had some rectal pain for many years now.  It is ongoing.  She had an MRI done in 2019 which was normal.  She has tried pelvic floor physical therapy.  She saw gastroenterology last year with a normal colonoscopy.  Does seem to feel better after bowel movements.  She has a history of a fistula.  Sitting hurts the most.  Sits baths help the most.  .. Active Ambulatory Problems    Diagnosis Date Noted  . Stage 3 severe COPD by GOLD classification ([place]) 12/26/2012  . Asthma 12/26/2012  . Complex partial seizure ([place]) 03/02/2013  . Former smoker 12/09/2013  . Mixed hyperlipidemia 12/09/2013  . Aortic atherosclerosis ([place]) 12/28/2014  . Bilateral lower extremity edema 07/06/2015  . Renal calculi 12/09/2015  . Kidney calculi 01/12/2016  . Finger fracture, right 08/24/2016  . Bilateral hip pain 09/11/2017  . Epigastric pain 09/11/2017  . RUQ pain 09/11/2017  . Class 1 obesity due to excess calories without serious comorbidity with body mass index (BMI) of 34.0 to 34.9 in adult 09/11/2017  . Hepatic steatosis 09/16/2017   . Cyst near tailbone 02/17/2018  . Generalized intra-abdominal and pelvic swelling, mass and lump 02/17/2018  . Polyp of sigmoid colon 02/17/2018  . Internal hemorrhoids 02/17/2018  . Diverticulosis 03/04/2018  . Benign essential hypertension 03/20/2019  . Adenomatous polyp of colon 09/08/2019  . Low serum vitamin B12 09/09/2019  . Elevated fasting glucose 09/09/2019  . SOB (shortness of breath) on exertion 09/09/2019  . Memory changes 09/09/2019  . Fatigue 09/09/2019  . Age related osteoporosis 09/09/2019  . Post-menopausal 09/09/2019  . Osteopenia 09/21/2019  . Left ventricular hypertrophy 09/22/2019  . Claudication of both lower extremities ([place]) 08/22/2020  . Hyperlipidemia associated with type 2 diabetes mellitus ([place]) 08/22/2020   Resolved Ambulatory Problems    Diagnosis Date Noted  . Hand injury, right, initial encounter 08/10/2016   Past Medical History:  Diagnosis Date  . Asthma with COPD ([place])   . Complex partial seizure disorder [facility]) neurologist-  dr Krista Blue  . History of cerebral aneurysm repair   . History of kidney stones   . History of partial seizures   . OSA on CPAP   . Retained ureteral stent   . SUI (stress urinary incontinence, female)     Review of Systems  All other systems reviewed and are negative.      Objective:   Physical Exam Vitals reviewed.  Constitutional:      Appearance: Normal appearance. She is obese.  HENT:     Head:  Normocephalic.  Cardiovascular:     Rate and Rhythm: Normal rate and regular rhythm.     Pulses: Normal pulses.     Heart sounds: Normal heart sounds.  Pulmonary:     Effort: Pulmonary effort is normal.     Breath sounds: Normal breath sounds.  Musculoskeletal:     Right lower leg: No edema.     Left lower leg: No edema.  Neurological:     General: No focal deficit present.     Mental Status: She is alert and oriented to person, place, and time.  Psychiatric:        Mood and Affect: Mood normal.           Assessment & Plan:  [name] KitchenMarland KitchenKemia was seen today for follow-up.  Diagnoses and all orders for this visit:  Aortic atherosclerosis (HCC) -     Pitavastatin Magnesium 2 MG TABS; Take 1 tablet by mouth daily. -     VAS [place] ABI WITH/WO TBI  Benign essential hypertension -     lisinopril-hydrochlorothiazide (ZESTORETIC) 10-12.5 MG tablet; Take 1 tablet by mouth daily. -     COMPLETE METABOLIC PANEL WITH GFR  Stage 3 severe COPD by GOLD classification (HCC) -     Albuterol Sulfate (PROAIR RESPICLICK) 505 (90 Base) MCG/ACT AEPB; Inhale 2 puffs into the lungs every 6 (six) hours as needed (shortness of breath/wheezing).  Hyperlipidemia associated with type 2 diabetes mellitus (HCC) -     Lipid Panel w/reflex Direct LDL -     VAS [place] ABI WITH/WO TBI  Claudication of both lower extremities (HCC) -     VAS [place] ABI WITH/WO TBI  Rectal pain -     DULoxetine (CYMBALTA) 30 MG capsule; Take 1 capsule (30 mg total) by mouth daily.   Patient's blood pressure looks phenomenal today.  Continue lisinopril HCTZ. CMP ordered for management.  Discussed COPD and progressive nature.  Her stopping smoking has probably helped her the most.  Continue budesonide more regularly and albuterol as needed.  Follow-up with any significant changes in breathing.  Concerned with patient stopping Livalo and her history of hyperlipidemia and aortic atherosclerosis.  Strongly recommended her going back on Livalo.  Patient agrees.  Some of her bilateral leg cramping sounds like PVD.  We will get ABIs to evaluate.  Patient has had ongoing rectal pain for at least 4 years now.  She has been worked up with MRI and pelvic floor physical therapy in the past.  Do think we are transitioning into more chronic pain.  She does not want to try another round of pelvic floor physical therapy.  Suggest the Cymbalta trial 30 mg daily.  She has also been seen by GI and has up-to-date colonoscopy.  She could always start working up low back for  more back etiology of symptoms if persist.   Follow up in 3 months.

## 2020-08-22 ENCOUNTER — Encounter: Payer: Self-pay | Admitting: Physician Assistant

## 2020-08-22 DIAGNOSIS — I739 Peripheral vascular disease, unspecified: Secondary | ICD-10-CM | POA: Insufficient documentation

## 2020-08-22 DIAGNOSIS — E785 Hyperlipidemia, unspecified: Secondary | ICD-10-CM | POA: Insufficient documentation

## 2020-08-22 DIAGNOSIS — E1169 Type 2 diabetes mellitus with other specified complication: Secondary | ICD-10-CM | POA: Insufficient documentation

## 2020-08-30 ENCOUNTER — Ambulatory Visit (HOSPITAL_COMMUNITY)
Admission: RE | Admit: 2020-08-30 | Discharge: 2020-08-30 | Disposition: A | Discharge status: Home / Self Care | Payer: Medicare Other | Source: Ambulatory Visit | Attending: Physician Assistant | Admitting: Physician Assistant

## 2020-08-30 ENCOUNTER — Other Ambulatory Visit: Payer: Self-pay

## 2020-08-30 DIAGNOSIS — I739 Peripheral vascular disease, unspecified: Secondary | ICD-10-CM | POA: Insufficient documentation

## 2020-08-30 DIAGNOSIS — I700 Atherosclerosis of aorta: Secondary | ICD-10-CM | POA: Diagnosis present

## 2020-08-30 DIAGNOSIS — E785 Hyperlipidemia, unspecified: Secondary | ICD-10-CM | POA: Insufficient documentation

## 2020-08-30 DIAGNOSIS — E1169 Type 2 diabetes mellitus with other specified complication: Secondary | ICD-10-CM

## 2020-08-30 NOTE — Progress Notes (Signed)
ABI has been completed.   Preliminary results in CV Proc.   Abram Sander 08/30/2020 10:18 AM

## 2020-08-30 NOTE — Progress Notes (Signed)
No peripheral vascular disease.

## 2020-10-14 ENCOUNTER — Other Ambulatory Visit (HOSPITAL_BASED_OUTPATIENT_CLINIC_OR_DEPARTMENT_OTHER): Payer: Self-pay

## 2020-10-14 ENCOUNTER — Ambulatory Visit: Payer: Medicare Other | Attending: Internal Medicine

## 2020-10-14 ENCOUNTER — Other Ambulatory Visit: Payer: Self-pay | Admitting: Neurology

## 2020-10-14 DIAGNOSIS — Z23 Encounter for immunization: Secondary | ICD-10-CM

## 2020-10-14 MED ORDER — PROAIR RESPICLICK 108 (90 BASE) MCG/ACT IN AEPB
INHALATION_SPRAY | RESPIRATORY_TRACT | 1 refills | Status: DC
  Filled 2020-10-14: qty 1, 30d supply, fill #0
  Filled 2021-07-14: qty 1, 30d supply, fill #1

## 2020-10-14 MED ORDER — PFIZER-BIONT COVID-19 VAC-TRIS 30 MCG/0.3ML IM SUSP
INTRAMUSCULAR | 0 refills | Status: DC
  Filled 2020-10-14: qty 0.3, 1d supply, fill #0

## 2020-10-14 NOTE — Telephone Encounter (Signed)
[place] called and left vm stating patient's insurance is not covering proair respiclick, but will cover plain proair or ventolin. Okay to switch? RX pended.

## 2020-10-14 NOTE — Progress Notes (Signed)
   Covid-19 Vaccination Clinic  Name:  Marissa Willis    MRN: [id number] DOB: [birth year]  10/14/2020  Marissa Willis was observed post Covid-19 immunization for 15 minutes without incident. She was provided with Vaccine Information Sheet and instruction to access the V-Safe system.   Marissa Willis was instructed to call 911 with any severe reactions post vaccine: [name] Difficulty breathing  . Swelling of face and throat  . A fast heartbeat  . A bad rash all over body  . Dizziness and weakness   Immunizations Administered    Name Date Dose VIS Date Route   PFIZER Comrnaty(Gray TOP) Covid-19 Vaccine 10/14/2020 12:27 PM 0.3 mL 05/26/2020 Intramuscular   Manufacturer: [place]   Lot: [id number]   NDC: [phone]

## 2020-11-18 ENCOUNTER — Other Ambulatory Visit: Payer: Self-pay | Admitting: Neurology

## 2020-11-18 DIAGNOSIS — R748 Abnormal levels of other serum enzymes: Secondary | ICD-10-CM

## 2020-11-18 DIAGNOSIS — N289 Disorder of kidney and ureter, unspecified: Secondary | ICD-10-CM

## 2020-11-18 LAB — COMPLETE METABOLIC PANEL WITH GFR
AG Ratio: 1.7 (calc) (ref 1.0–2.5)
ALT: 84 U/L — ABNORMAL HIGH (ref 6–29)
AST: 55 U/L — ABNORMAL HIGH (ref 10–35)
Albumin: 4.5 g/dL (ref 3.6–5.1)
Alkaline phosphatase (APISO): 60 U/L (ref 37–153)
BUN/Creatinine Ratio: 15 (calc) (ref 6–22)
BUN: 16 mg/dL (ref 7–25)
CO2: 31 mmol/L (ref 20–32)
Calcium: 11 mg/dL — ABNORMAL HIGH (ref 8.6–10.4)
Chloride: 99 mmol/L (ref 98–110)
Creat: 1.04 mg/dL — ABNORMAL HIGH (ref 0.50–0.99)
GFR, Est African American: 64 mL/min/{1.73_m2} (ref >=60)
GFR, Est Non African American: 56 mL/min/{1.73_m2} — ABNORMAL LOW (ref >=60)
Globulin: 2.7 g/dL (calc) (ref 1.9–3.7)
Glucose, Bld: 97 mg/dL (ref 65–99)
Potassium: 4.5 mmol/L (ref 3.5–5.3)
Sodium: 139 mmol/L (ref 135–146)
Total Bilirubin: 0.5 mg/dL (ref 0.2–1.2)
Total Protein: 7.2 g/dL (ref 6.1–8.1)

## 2020-11-18 LAB — LIPID PANEL W/REFLEX DIRECT LDL
Cholesterol: 186 mg/dL (ref <200)
HDL: 57 mg/dL (ref >=50)
LDL Cholesterol (Calc): 104 mg/dL (calc) — ABNORMAL HIGH
Non-HDL Cholesterol (Calc): 129 mg/dL (calc) (ref <130)
Total CHOL/HDL Ratio: 3.3 (calc) (ref <5.0)
Triglycerides: 145 mg/dL (ref <150)

## 2020-11-18 NOTE — Progress Notes (Signed)
Marissa Willis,   Cholesterol is MUCH better.  Glucose is good.   Kidney function decreased quite a bit from one year ago. Calcium level is up too.  We need to recheck kidney function in 1 month to see if trending up or down. Will check vitamin D, Thyroid and PTH at that time as well to look closer at your parathyroid.  Your liver enzymes have been up and down over the years but up again.  Drink plenty of water, avoid alcohol and tylenol and get labs in one month.

## 2021-02-14 ENCOUNTER — Other Ambulatory Visit (HOSPITAL_BASED_OUTPATIENT_CLINIC_OR_DEPARTMENT_OTHER): Payer: Self-pay

## 2021-02-15 ENCOUNTER — Emergency Department (INDEPENDENT_AMBULATORY_CARE_PROVIDER_SITE_OTHER)
Admission: EM | Admit: 2021-02-15 | Discharge: 2021-02-15 | Disposition: A | Discharge status: Home / Self Care | Payer: Medicare Other | Source: Home / Self Care

## 2021-02-15 DIAGNOSIS — R300 Dysuria: Secondary | ICD-10-CM

## 2021-02-15 DIAGNOSIS — R339 Retention of urine, unspecified: Secondary | ICD-10-CM | POA: Diagnosis not present

## 2021-02-15 LAB — POCT URINALYSIS DIP (MANUAL ENTRY)
Bilirubin, UA: NEGATIVE
Glucose, UA: NEGATIVE mg/dL
Ketones, POC UA: NEGATIVE mg/dL
Leukocytes, UA: NEGATIVE
Nitrite, UA: NEGATIVE
Protein Ur, POC: NEGATIVE mg/dL
Spec Grav, UA: 1.015 (ref 1.010–1.025)
Urobilinogen, UA: 0.2 E.U./dL
pH, UA: 7 (ref 5.0–8.0)

## 2021-02-15 NOTE — ED Triage Notes (Signed)
PT presents with dysuria, urinary urgency, and fluid retention x4 days.

## 2021-02-15 NOTE — ED Provider Notes (Signed)
[name]    CSN: [id number] Arrival date & time: 02/15/21  1214      History   Chief Complaint Chief Complaint  Patient presents with   Urinary Urgency   fluid retention   Dysuria    HPI Marissa Willis is a 65 y.o. female.   HPI 65 year old female presents with dysuria, urinary urgency, and urinary retention x4 days.  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   History of kidney stones    History of partial seizures    x2 seizure in 2014  dx complex parital seizures started on keppra-- per pt no seizures since off keppra   OSA on CPAP    Retained ureteral stent    right side migrated   SUI (stress urinary incontinence, female)    mild    Patient Active Problem List   Diagnosis Date Noted   Claudication of both lower extremities ([place]) 08/22/2020   Hyperlipidemia associated with type 2 diabetes mellitus ([place]) 08/22/2020   Left ventricular hypertrophy 09/22/2019   Osteopenia 09/21/2019   Low serum vitamin B12 09/09/2019   Elevated fasting glucose 09/09/2019   SOB (shortness of breath) on exertion 09/09/2019   Memory changes 09/09/2019   Fatigue 09/09/2019   Age related osteoporosis 09/09/2019   Post-menopausal 09/09/2019   Adenomatous polyp of colon 09/08/2019   Benign essential hypertension 03/20/2019   Diverticulosis 03/04/2018   Cyst near tailbone 02/17/2018   Generalized intra-abdominal and pelvic swelling, mass and lump 02/17/2018   Polyp of sigmoid colon 02/17/2018   Internal hemorrhoids 02/17/2018   Hepatic steatosis 09/16/2017   Bilateral hip pain 09/11/2017   Epigastric pain 09/11/2017   RUQ pain 09/11/2017   Class 1 obesity due to excess calories  without serious comorbidity with body mass index (BMI) of 34.0 to 34.9 in adult 09/11/2017   Finger fracture, right 08/24/2016   Kidney calculi 01/12/2016   Renal calculi 12/09/2015   Bilateral lower extremity edema 07/06/2015   Aortic atherosclerosis ([place]) 12/28/2014   Former smoker 12/09/2013   Mixed hyperlipidemia 12/09/2013   Complex partial seizure ([place]) 03/02/2013   Stage 3 severe COPD by GOLD classification ([place]) 12/26/2012   Asthma 12/26/2012    Past Surgical History:  Procedure Laterality Date   [place]  2011     at [facility]   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   CYSTOSCOPY WITH URETEROSCOPY AND STENT PLACEMENT Right 12/09/2015   Procedure: CYSTOSCOPY WITH retrograde AND STENT PLACEMENT;  Surgeon: Franchot Gallo, MD;  Location: [facility];  Service: Urology;  Laterality: Right;   CYSTOSCOPY WITH URETEROSCOPY AND STENT PLACEMENT Right 02/23/2016   Procedure: CYSTOSCOPY  AND STENT EXTRACTION;  Surgeon: Franchot Gallo, MD;  Location: [facility];  Service: Urology;  Laterality: Right;   IR GENERIC HISTORICAL  01/12/2016   IR URETERAL STENT RIGHT NEW ACCESS W/O SEP NEPHROSTOMY CATH 01/12/2016 Corrie Mckusick, DO [facility]   NEPHROLITHOTOMY Right 01/12/2016   Procedure: RIGHT  PERCUTANEOUS NEPHROLITHOTOMY  insertion double j stent flexible cystoscopy  removable right double j stent;  Surgeon: Franchot Gallo,  MD;  Location: [facility];  Service: Urology;  Laterality: Right;   TUBAL LIGATION  1991    OB History   No obstetric history on file.      Home Medications    Prior to Admission medications   Medication Sig Start Date End Date Taking? Authorizing Provider  Albuterol Sulfate (PROAIR RESPICLICK) 123XX123 (90 Base) MCG/ACT AEPB Inhale 2 puffs into the lungs every 6 (six) hours as needed (shortness of breath/wheezing). 08/19/20   Breeback, Jade L, PA-C  Albuterol Sulfate (PROAIR  RESPICLICK) 123XX123 (90 Base) MCG/ACT AEPB Inhale 2 puffs by mouth into the lungs every 6 hours as needed for shortness of breath and wheezing 08/19/20   Donella Stade, PA-C  aspirin EC 81 MG tablet Take 81 mg by mouth daily. Swallow whole.    [provider]  budesonide (PULMICORT) 0.25 MG/2ML nebulizer solution Take 2 mLs (0.25 mg total) by nebulization in the morning and at bedtime. 12/25/19   Breeback, Jade L, PA-C  COVID-19 mRNA Vac-TriS, Pfizer, (PFIZER-BIONT COVID-19 VAC-TRIS) SUSP injection Inject into the muscle. 10/14/20   Carlyle Basques, MD  DULoxetine (CYMBALTA) 30 MG capsule Take 1 capsule (30 mg total) by mouth daily. Patient not taking: Reported on 02/15/2021 08/19/20   Donella Stade, PA-C  lisinopril-hydrochlorothiazide (ZESTORETIC) 10-12.5 MG tablet Take 1 tablet by mouth daily. 08/19/20   Breeback, Royetta Car, PA-C  Multiple Vitamin (MULTIVITAMIN) capsule Take 1 capsule by mouth daily.    [provider]  Pitavastatin Magnesium 2 MG TABS Take 1 tablet by mouth daily. 08/19/20   Donella Stade, PA-C    Family History Family History  Problem Relation Age of Onset   Diabetes Mother    Heart disease Mother    Hypertension Mother    Heart disease Father     Social History Social History   Tobacco Use   Smoking status: Former    Packs/day: 1.50    Years: 45.00    Pack years: 67.50    Types: Cigarettes    Quit date: 08/07/2017    Years since quitting: 3.5   Smokeless tobacco: Never  Vaping Use   Vaping Use: Never used  Substance Use Topics   Alcohol use: Yes    Comment: 1-2 drinks daily   Drug use: Yes    Types: Marijuana     Allergies   Atorvastatin and Crestor [rosuvastatin]   Review of Systems Review of Systems  Genitourinary:  Positive for difficulty urinating, dysuria and frequency.  All other systems reviewed and are negative.   Physical Exam Triage Vital Signs ED Triage Vitals  Enc Vitals Group     BP 02/15/21 1244 136/72     Pulse Rate  02/15/21 1244 63     Resp 02/15/21 1244 14     Temp 02/15/21 1244 99 F (37.2 C)     Temp Source 02/15/21 1244 Oral     SpO2 02/15/21 1244 95 %     Weight 02/15/21 1247 192 lb (87.1 kg)     Height 02/15/21 1247 '5\' 3"'$  (1.6 m)     Head Circumference --      Peak Flow --      Pain Score 02/15/21 1246 0     Pain Loc --      Pain Edu? --      Excl. in [place]? --    No data found.  Updated Vital Signs BP 136/72 (BP Location: Left Arm)   Pulse 63   Temp 99  F (37.2 C) (Oral)   Resp 14   Ht '5\' 3"'$  (1.6 m)   Wt 192 lb (87.1 kg)   SpO2 95%   BMI 34.01 kg/m      Physical Exam Vitals and nursing note reviewed.  Constitutional:      General: She is not in acute distress.    Appearance: She is obese. She is not ill-appearing.  HENT:     Head: Normocephalic and atraumatic.     Mouth/Throat:     Mouth: Mucous membranes are moist.     Pharynx: Oropharynx is clear.  Eyes:     Extraocular Movements: Extraocular movements intact.     Conjunctiva/sclera: Conjunctivae normal.     Pupils: Pupils are equal, round, and reactive to light.  Cardiovascular:     Rate and Rhythm: Normal rate and regular rhythm.     Pulses: Normal pulses.     Heart sounds: Normal heart sounds.  Pulmonary:     Effort: Pulmonary effort is normal.     Breath sounds: Normal breath sounds. No wheezing, rhonchi or rales.  Abdominal:     Tenderness: There is no right CVA tenderness or left CVA tenderness.  Musculoskeletal:        General: Normal range of motion.     Cervical back: Normal range of motion and neck supple.  Skin:    General: Skin is warm and dry.  Neurological:     General: No focal deficit present.     Mental Status: She is alert and oriented to person, place, and time. Mental status is at baseline.  Psychiatric:        Mood and Affect: Mood normal.        Behavior: Behavior normal.        Thought Content: Thought content normal.     UC Treatments / Results  Labs (all labs ordered are  listed, but only abnormal results are displayed) Labs Reviewed  POCT URINALYSIS DIP (MANUAL ENTRY) - Abnormal; Notable for the following components:      Result Value   Blood, UA trace-intact (*)    All other components within normal limits  URINE CULTURE    EKG   Radiology No results found.  Procedures Procedures (including critical care time)  Medications Ordered in UC Medications - No data to display  Initial Impression / Assessment and Plan / UC Course  I have reviewed the triage vital signs and the nursing notes.  Pertinent labs & imaging results that were available during my care of the patient were reviewed by me and considered in my medical decision making (see chart for details).     MDM: 1. Dysuria-UA reveals trace blood, urine culture ordered; 2.  Urinary retention-Advised patient we will follow-up with urine culture results once received.  Advised/instructed patient if unable to urinate please go to nearest ED for immediate evaluation.  Patient discharged home, hemodynamically stable. Final Clinical Impressions(s) / UC Diagnoses   Final diagnoses:  Dysuria  Urinary retention     Discharge Instructions      Advised patient we will follow-up with urine culture results once received.  Advised/instructed patient if unable to urinate please go to nearest ED for immediate evaluation.     ED Prescriptions   None    PDMP not reviewed this encounter.   Eliezer Lofts, [place] 02/15/21 1428

## 2021-02-15 NOTE — Discharge Instructions (Addendum)
Advised patient we will follow-up with urine culture results once received.  Advised/instructed patient if unable to urinate please go to nearest ED for immediate evaluation.

## 2021-02-17 LAB — URINE CULTURE
MICRO NUMBER:: 12319041
SPECIMEN QUALITY:: ADEQUATE

## 2021-02-18 ENCOUNTER — Telehealth: Payer: Self-pay | Admitting: Emergency Medicine

## 2021-02-18 NOTE — Telephone Encounter (Signed)
Message left for pt to call back - pharmacy on record is [facility] outpatient [facility]

## 2021-02-18 NOTE — Telephone Encounter (Signed)
Call back from [facility] regarding pharmacy for antibiotic. RN reviewed results with Jackelyn Poling, who stated she did not want to use [facility] or [facility]. [facility] or [facility] due to the holiday weekend. Pt declined and requested antibiotic be sent to pharmacy on record which is closed over the weekend, unknown if it is open on Labor day. Debbie verbalized an understanding that she should go to the ER if she develops worsening symptoms.

## 2021-02-19 MED ORDER — NITROFURANTOIN MONOHYD MACRO 100 MG PO CAPS
100.0000 mg | ORAL_CAPSULE | Freq: Two times a day (BID) | ORAL | 0 refills | Status: DC
  Filled 2021-02-19: qty 10, 5d supply, fill #0

## 2021-02-19 NOTE — Telephone Encounter (Signed)
Patient called regarding her urine culture report.  She still having minor symptoms.  She chooses to have her antibiotics sent to the med center at [facility] outpatient pharmacy.  This was discussed with her with the RN on duty.  I reviewed patient's chart.  Reviewed her culture report.  Reviewed her allergies.  We will send Macrodantin to the pharmacy of her choice

## 2021-02-21 ENCOUNTER — Other Ambulatory Visit (HOSPITAL_BASED_OUTPATIENT_CLINIC_OR_DEPARTMENT_OTHER): Payer: Self-pay

## 2021-03-15 ENCOUNTER — Ambulatory Visit: Payer: Medicare Other | Attending: Internal Medicine

## 2021-03-15 ENCOUNTER — Other Ambulatory Visit (HOSPITAL_BASED_OUTPATIENT_CLINIC_OR_DEPARTMENT_OTHER): Payer: Self-pay

## 2021-03-15 DIAGNOSIS — Z23 Encounter for immunization: Secondary | ICD-10-CM

## 2021-03-15 MED ORDER — INFLUENZA VAC A&B SA ADJ QUAD 0.5 ML IM PRSY
PREFILLED_SYRINGE | INTRAMUSCULAR | 0 refills | Status: DC
  Filled 2021-03-15: qty 0.5, 1d supply, fill #0

## 2021-03-15 NOTE — Progress Notes (Signed)
   Covid-19 Vaccination Clinic  Name:  [name]    MRN: [id number] DOB: 09/24/1955  03/15/2021  Ms. Grothaus was observed post Covid-19 immunization for 15 minutes without incident. She was provided with Vaccine Information Sheet and instruction to access the V-Safe system.   Ms. Dimaano was instructed to call 911 with any severe reactions post vaccine: Difficulty breathing  Swelling of face and throat  A fast heartbeat  A bad rash all over body  Dizziness and weakness

## 2021-03-24 ENCOUNTER — Other Ambulatory Visit (HOSPITAL_BASED_OUTPATIENT_CLINIC_OR_DEPARTMENT_OTHER): Payer: Self-pay

## 2021-03-24 MED ORDER — COVID-19MRNA BIVAL VACC PFIZER 30 MCG/0.3ML IM SUSP
INTRAMUSCULAR | 0 refills | Status: DC
  Filled 2021-03-24: qty 0.3, 1d supply, fill #0

## 2021-04-04 ENCOUNTER — Encounter: Payer: Self-pay | Admitting: Physician Assistant

## 2021-04-04 ENCOUNTER — Other Ambulatory Visit: Payer: Self-pay | Admitting: Physician Assistant

## 2021-04-04 ENCOUNTER — Ambulatory Visit (INDEPENDENT_AMBULATORY_CARE_PROVIDER_SITE_OTHER): Payer: Medicare Other | Admitting: Physician Assistant

## 2021-04-04 VITALS — BP 130/69 | HR 79 | Temp 98.8°F | Wt 195.0 lb

## 2021-04-04 DIAGNOSIS — L989 Disorder of the skin and subcutaneous tissue, unspecified: Secondary | ICD-10-CM

## 2021-04-04 DIAGNOSIS — Z23 Encounter for immunization: Secondary | ICD-10-CM | POA: Diagnosis not present

## 2021-04-04 NOTE — Progress Notes (Signed)
   Subjective:    Patient ID: Marissa Willis, female    DOB: 12/20/1955, 65 y.o.   MRN: [id number]  HPI Pt is a 65 yo female who presents to the clinic to discuss a bump on her left back. She has had this for over a year. It used to be much smaller and getting bigger. It is on her bra line and becoming irritated. She thought it was a bug bite for a while but will not go away. It itches and burns.     Review of Systems See HPI.     Objective:   Physical Exam  Upper left back 2cm by 1.5cm erythematous slightly raised lesion.       Assessment & Plan:  [name] KitchenMarland KitchenNicky was seen today for skin lesion.  Diagnoses and all orders for this visit:  Skin lesion -     Surgical pathology  Need for influenza vaccination -     Flu Vaccine QUAD High Dose(Fluad)  Punch Biopsy Procedure Note  Pre-operative Diagnosis: Suspicious lesion  Post-operative Diagnosis: same  Locations: upper left back  Indications: concern for malignancy  Anesthesia: Lidocaine 1% with epinephrine without added sodium bicarbonate  Procedure Details  History of allergy to iodine: no Patient informed of the risks (including bleeding and infection) and benefits of the  procedure and Verbal informed consent obtained.  The lesion and surrounding area was given a sterile prep using chlorhexidine and draped in the usual sterile fashion. The skin was then stretched perpendicular to the skin tension lines and the lesion removed using the 23mm punch. The resulting ellipse was then closed. The wound was closed with 3-0 Prolene using simple interrupted stitches. Antibiotic ointment and a sterile dressing applied. The specimen was sent for pathologic examination. The patient tolerated the procedure well.  EBL: scant  Condition: Stable  Complications: none.  Plan: 1. Instructed to keep the wound dry and covered for 24-48h and clean thereafter. 2. Warning signs of infection were reviewed.   3. Recommended that the  patient use OTC acetaminophen as needed for pain.  4. Return for suture removal in 1 week.

## 2021-04-04 NOTE — Patient Instructions (Signed)
Skin Biopsy, Care After  This sheet gives you information about how to care for yourself after your procedure. Your health care provider may also give you more specific instructions. If you have problems or questions, contact your health care provider.  What can I expect after the procedure?  After the procedure, it is common to have:  Soreness.  Bruising.  Itching.  Follow these instructions at home:  Biopsy site care    Follow instructions from your health care provider about how to take care of your biopsy site. Make sure you:  Wash your hands with soap and water before and after you change your bandage (dressing). If soap and water are not available, use hand sanitizer.  Apply ointment on your biopsy site as directed by your health care provider.  Change your dressing as told by your health care provider.  Leave stitches (sutures), skin glue, or adhesive strips in place. These skin closures may need to stay in place for 2 weeks or longer. If adhesive strip edges start to loosen and curl up, you may trim the loose edges. Do not remove adhesive strips completely unless your health care provider tells you to do that.  If the biopsy area bleeds, apply gentle pressure for 10 minutes.  Check your biopsy site every day for signs of infection. Check for:  Redness, swelling, or pain.  Fluid or blood.  Warmth.  Pus or a bad smell.     General instructions  Rest and then return to your normal activities as told by your health care provider.  Take over-the-counter and prescription medicines only as told by your health care provider.  Keep all follow-up visits as told by your health care provider. This is important.  Contact a health care provider if:  You have redness, swelling, or pain around your biopsy site.  You have fluid or blood coming from your biopsy site.  Your biopsy site feels warm to the touch.  You have pus or a bad smell coming from your biopsy site.  You have a fever.  Your sutures, skin glue, or adhesive  strips loosen or come off sooner than expected.  Get help right away if:  You have bleeding that does not stop with pressure or a dressing.  Summary  After the procedure, it is common to have soreness, bruising, and itching at the site.  Follow instructions from your health care provider about how to take care of your biopsy site.  Check your biopsy site every day for signs of infection.  Contact a health care provider if you have redness, swelling, or pain around your biopsy site, or your biopsy site feels warm to the touch.  Keep all follow-up visits as told by your health care provider. This is important.  This information is not intended to replace advice given to you by your health care provider. Make sure you discuss any questions you have with your health care provider.  Document Revised: 12/02/2017 Document Reviewed: 12/02/2017  Elsevier Patient Education  2022 Elsevier Inc[employer]

## 2021-04-06 ENCOUNTER — Encounter: Payer: Self-pay | Admitting: Physician Assistant

## 2021-04-06 ENCOUNTER — Other Ambulatory Visit: Payer: Self-pay | Admitting: Physician Assistant

## 2021-04-06 DIAGNOSIS — C44519 Basal cell carcinoma of skin of other part of trunk: Secondary | ICD-10-CM

## 2021-04-06 NOTE — Progress Notes (Signed)
Path is actually already back and does show basal cell carcinoma a skin cancer. You will need to see a dermatology and have a procedure to make sure all the cancer cells have been removed. I will make referral.

## 2021-04-13 ENCOUNTER — Ambulatory Visit: Payer: Medicare Other | Admitting: Physician Assistant

## 2021-04-26 ENCOUNTER — Telehealth (INDEPENDENT_AMBULATORY_CARE_PROVIDER_SITE_OTHER): Payer: Medicare Other | Admitting: Physician Assistant

## 2021-04-26 ENCOUNTER — Other Ambulatory Visit (HOSPITAL_BASED_OUTPATIENT_CLINIC_OR_DEPARTMENT_OTHER): Payer: Self-pay

## 2021-04-26 VITALS — Ht 63.0 in | Wt 186.0 lb

## 2021-04-26 DIAGNOSIS — J329 Chronic sinusitis, unspecified: Secondary | ICD-10-CM | POA: Diagnosis not present

## 2021-04-26 DIAGNOSIS — J40 Bronchitis, not specified as acute or chronic: Secondary | ICD-10-CM | POA: Diagnosis not present

## 2021-04-26 DIAGNOSIS — H6983 Other specified disorders of Eustachian tube, bilateral: Secondary | ICD-10-CM | POA: Insufficient documentation

## 2021-04-26 DIAGNOSIS — J441 Chronic obstructive pulmonary disease with (acute) exacerbation: Secondary | ICD-10-CM

## 2021-04-26 DIAGNOSIS — R0981 Nasal congestion: Secondary | ICD-10-CM | POA: Insufficient documentation

## 2021-04-26 MED ORDER — PREDNISONE 50 MG PO TABS
ORAL_TABLET | ORAL | 0 refills | Status: DC
  Filled 2021-04-26: qty 5, 5d supply, fill #0

## 2021-04-26 MED ORDER — AZITHROMYCIN 250 MG PO TABS
ORAL_TABLET | ORAL | 0 refills | Status: DC
  Filled 2021-04-26: qty 6, 5d supply, fill #0

## 2021-04-26 NOTE — Progress Notes (Signed)
Covid test - negative this morning  Started over the weekend Sore throat Cough Congestion Green/yellow drainage Body aches Headaches Watery eyes  Grandchild is sick  Taking tylenol, alka seltzer cold

## 2021-04-26 NOTE — Progress Notes (Signed)
..  Virtual Visit via Video Note  I connected with Marissa Willis on 04/27/21 at  3:00 PM EST by a video enabled telemedicine application and verified that I am speaking with the correct person using two identifiers.  Location: Patient: home Provider: clinic  .[name] KitchenParticipating in visit:  Patient: Marissa Willis Provider: Iran Planas PA-C    I discussed the limitations of evaluation and management by telemedicine and the availability of in person appointments. The patient expressed understanding and agreed to proceed.  History of Present Illness: Pt is a 65 yo female with asthma and COPD stage 3 who presents to the clinic with ST, cough, congestion, sinus drainage, body aches, headaches for 6 days. She tested negative for covid this morning. Her grandchild is sick. She is taking tylenol, alka seltzer cold with little relief. She is having to use albuterol inhalers 4 to 5 times a day due to chest tightness.     Observations/Objective: No acute distress Productive cough No labored breathing Normal mood and appearance  .[name] Today's Vitals   04/26/21 1444  Weight: 186 lb (84.4 kg)  Height: 5\' 3"  (1.6 m)   Body mass index is 32.95 kg/m.    Assessment and Plan: [name] KitchenMarland KitchenErendida was seen today for copd.  Diagnoses and all orders for this visit:  Sinobronchitis -     predniSONE (DELTASONE) 50 MG tablet; Take one tablet by mouth daily for 5 days. -     azithromycin (ZITHROMAX Z-PAK) 250 MG tablet; Take 2 tablets (500 mg) by mouth on Day 1,  followed by 1 tablet (250 mg) once daily on Days 2 through 5.  COPD exacerbation (HCC)  Neg covid.  Hx of asthma.  Treated with zpak and prednisone.  Continue symptomatic care.  Continue to use rescue inhaler as needed and preventative daily.  Follow up as needed and if symptoms worsen.    Follow Up Instructions:    I discussed the assessment and treatment plan with the patient. The patient was provided an opportunity to ask questions and all were  answered. The patient agreed with the plan and demonstrated an understanding of the instructions.   The patient was advised to call back or seek an in-person evaluation if the symptoms worsen or if the condition fails to improve as anticipated.     Iran Planas, PA-C

## 2021-04-27 ENCOUNTER — Encounter: Payer: Self-pay | Admitting: Physician Assistant

## 2021-07-14 ENCOUNTER — Other Ambulatory Visit (HOSPITAL_BASED_OUTPATIENT_CLINIC_OR_DEPARTMENT_OTHER): Payer: Self-pay

## 2021-07-17 ENCOUNTER — Other Ambulatory Visit (HOSPITAL_BASED_OUTPATIENT_CLINIC_OR_DEPARTMENT_OTHER): Payer: Self-pay

## 2021-08-14 ENCOUNTER — Ambulatory Visit (INDEPENDENT_AMBULATORY_CARE_PROVIDER_SITE_OTHER): Payer: Medicare Other | Admitting: Physician Assistant

## 2021-08-14 ENCOUNTER — Other Ambulatory Visit: Payer: Self-pay

## 2021-08-14 ENCOUNTER — Encounter: Payer: Self-pay | Admitting: Physician Assistant

## 2021-08-14 VITALS — BP 135/76 | HR 66 | Ht 63.0 in | Wt 201.0 lb

## 2021-08-14 DIAGNOSIS — Z13820 Encounter for screening for osteoporosis: Secondary | ICD-10-CM

## 2021-08-14 DIAGNOSIS — I10 Essential (primary) hypertension: Secondary | ICD-10-CM

## 2021-08-14 DIAGNOSIS — J449 Chronic obstructive pulmonary disease, unspecified: Secondary | ICD-10-CM

## 2021-08-14 DIAGNOSIS — E785 Hyperlipidemia, unspecified: Secondary | ICD-10-CM | POA: Diagnosis not present

## 2021-08-14 DIAGNOSIS — G8929 Other chronic pain: Secondary | ICD-10-CM | POA: Insufficient documentation

## 2021-08-14 DIAGNOSIS — Z6835 Body mass index (BMI) 35.0-35.9, adult: Secondary | ICD-10-CM

## 2021-08-14 DIAGNOSIS — Z780 Asymptomatic menopausal state: Secondary | ICD-10-CM

## 2021-08-14 DIAGNOSIS — Z23 Encounter for immunization: Secondary | ICD-10-CM | POA: Diagnosis not present

## 2021-08-14 DIAGNOSIS — M545 Low back pain, unspecified: Secondary | ICD-10-CM

## 2021-08-14 DIAGNOSIS — I700 Atherosclerosis of aorta: Secondary | ICD-10-CM

## 2021-08-14 DIAGNOSIS — M25552 Pain in left hip: Secondary | ICD-10-CM

## 2021-08-14 DIAGNOSIS — E66812 Obesity, class 2: Secondary | ICD-10-CM

## 2021-08-14 DIAGNOSIS — R748 Abnormal levels of other serum enzymes: Secondary | ICD-10-CM | POA: Insufficient documentation

## 2021-08-14 DIAGNOSIS — R7301 Impaired fasting glucose: Secondary | ICD-10-CM | POA: Diagnosis not present

## 2021-08-14 DIAGNOSIS — K635 Polyp of colon: Secondary | ICD-10-CM

## 2021-08-14 DIAGNOSIS — M25551 Pain in right hip: Secondary | ICD-10-CM

## 2021-08-14 DIAGNOSIS — Z122 Encounter for screening for malignant neoplasm of respiratory organs: Secondary | ICD-10-CM

## 2021-08-14 MED ORDER — LISINOPRIL-HYDROCHLOROTHIAZIDE 10-12.5 MG PO TABS: 1.0000 | ORAL_TABLET | Freq: Every day | ORAL | 3 refills | Status: DC

## 2021-08-14 MED ORDER — PITAVASTATIN MAGNESIUM 2 MG PO TABS: 1.0000 | ORAL_TABLET | Freq: Every day | ORAL | 3 refills | Status: DC

## 2021-08-14 NOTE — Patient Instructions (Addendum)
Will refer for lung CT screening Will get labs today.  Will order bone density for march.  Will make referral to GI for November.   Follow up with Dr. Darene Lamer for low back and hip pain

## 2021-08-14 NOTE — Progress Notes (Signed)
Subjective:    Patient ID: Marissa Willis, female    DOB: Sep 08, 1955, 66 y.o.   MRN: [id number]  HPI Pt is a 66 yo obese female with COPD, HTN, HLD who presents to the clinic for medication follow up.   She has stopped smoking and breathing is much better. She cannot afford inhalers so uses pulmicort neb as needed. Twice a day keeps her up at night. SOB with exertion.   Pt request a different GI provider in [place].   Not checking BP at home. No CP, palpitations, headaches, vision changes.   She is having some low back and hip pain intermittent. ROM is not effected. No injury. No radiation of pain into legs. No strength changes.   .. Active Ambulatory Problems    Diagnosis Date Noted   Stage 3 severe COPD by GOLD classification ([place]) 12/26/2012   Asthma 12/26/2012   Complex partial seizure ([place]) 03/02/2013   Former smoker 12/09/2013   Mixed hyperlipidemia 12/09/2013   Aortic atherosclerosis ([place]) 12/28/2014   Bilateral lower extremity edema 07/06/2015   Renal calculi 12/09/2015   Kidney calculi 01/12/2016   Finger fracture, right 08/24/2016   Bilateral hip pain 09/11/2017   Epigastric pain 09/11/2017   RUQ pain 09/11/2017   Class 2 severe obesity due to excess calories with serious comorbidity and body mass index (BMI) of 35.0 to 35.9 in adult ([place]) 09/11/2017   Hepatic steatosis 09/16/2017   Cyst near tailbone 02/17/2018   Generalized intra-abdominal and pelvic swelling, mass and lump 02/17/2018   Polyp of sigmoid colon 02/17/2018   Internal hemorrhoids 02/17/2018   Diverticulosis 03/04/2018   Benign essential hypertension 03/20/2019   Adenomatous polyp of colon 09/08/2019   Low serum vitamin B12 09/09/2019   Elevated fasting glucose 09/09/2019   SOB (shortness of breath) on exertion 09/09/2019   Memory changes 09/09/2019   Fatigue 09/09/2019   Age related osteoporosis 09/09/2019   Post-menopausal 09/09/2019   Osteopenia 09/21/2019   Left ventricular hypertrophy  09/22/2019   Claudication of both lower extremities ([place]) 08/22/2020   Hyperlipidemia associated with type 2 diabetes mellitus ([place]) 08/22/2020   Dysfunction of both eustachian tubes 04/26/2021   Nasal congestion 04/26/2021   Serum calcium elevated 08/14/2021   Elevated liver enzymes 08/14/2021   Chronic bilateral low back pain without sciatica 08/14/2021   Osteoporosis screening 08/14/2021   Resolved Ambulatory Problems    Diagnosis Date Noted   Hand injury, right, initial encounter 08/10/2016   Past Medical History:  Diagnosis Date   Asthma with COPD ([place])    Complex partial seizure disorder [facility]) neurologist-  dr Krista Blue   History of cerebral aneurysm repair    History of kidney stones    History of partial seizures    OSA on CPAP    Retained ureteral stent    SUI (stress urinary incontinence, female)      Review of Systems  All other systems reviewed and are negative.     Objective:   Physical Exam Vitals reviewed.  Constitutional:      Appearance: Normal appearance. She is obese.  HENT:     Head: Normocephalic.  Cardiovascular:     Rate and Rhythm: Normal rate and regular rhythm.  Pulmonary:     Effort: Pulmonary effort is normal.  Musculoskeletal:     Right lower leg: No edema.     Left lower leg: No edema.     Comments: NROM at waist Tenderness over bilateral SI joints No tenderness over greater trochanters  NROM at hips  Neurological:     General: No focal deficit present.     Mental Status: She is alert and oriented to person, place, and time.  Psychiatric:        Mood and Affect: Mood normal.    .. Depression screen [facility] 2/9 09/11/2017  Decreased Interest 1  Down, Depressed, Hopeless 1  PHQ - 2 Score 2  Altered sleeping 0  Tired, decreased energy 0  Change in appetite 1  Feeling bad or failure about yourself  1  Trouble concentrating 0  Moving slowly or fidgety/restless 0  Suicidal thoughts 0  PHQ-9 Score 4  Difficult doing work/chores Not  difficult at all   . GAD 7 : Generalized Anxiety Score 09/11/2017  Nervous, Anxious, on Edge 0  Control/stop worrying 1  Worry too much - different things 1  Trouble relaxing 0  Restless 0  Easily annoyed or irritable 0  Afraid - awful might happen 0  Total GAD 7 Score 2  Anxiety Difficulty Not difficult at all          Assessment & Plan:  [name] KitchenMarland KitchenMakaiah was seen today for follow-up.  Diagnoses and all orders for this visit:  Benign essential hypertension -     BASIC METABOLIC PANEL WITH GFR -     lisinopril-hydrochlorothiazide (ZESTORETIC) 10-12.5 MG tablet; Take 1 tablet by mouth daily.  Stage 3 severe COPD by GOLD classification (HCC)  Hyperlipidemia LDL goal <100  Elevated fasting glucose -     Hemoglobin A1c  Elevated liver enzymes -     Hepatic function panel  Serum calcium elevated -     VITAMIN D 25 Hydroxy (Vit-D Deficiency, Fractures) -     PTH, Intact and Calcium  Post-menopausal -     VITAMIN D 25 Hydroxy (Vit-D Deficiency, Fractures) -     DG Bone Density; Future  Bilateral hip pain  Polyp of sigmoid colon, unspecified type -     Ambulatory referral to Gastroenterology  Class 2 severe obesity due to excess calories with serious comorbidity and body mass index (BMI) of 35.0 to 35.9 in adult [facility])  Osteoporosis screening -     DG Bone Density; Future  Screening for lung cancer -     Ambulatory Referral for Lung Cancer Scre  Aortic atherosclerosis (HCC) -     Pitavastatin Magnesium 2 MG TABS; Take 1 tablet by mouth daily.  Chronic bilateral low back pain without sciatica   Pulse ox 99 percent today Make sure using budesonide at least once every day since bid keeps patient up.  Albuterol only as needed.  Former smoker, low dose cT screening ordered for patient.   BP improved on 2nd recheck. Refill sent.   Lipid UTD. Livalo sent.   Need for bone density.  Declined mammogram.  Will make referral to GI for recheck colonoscopy Pneumonia  vaccine done  Declined shingrix/flu/covid/tdap.  Declined xrays today for hip and low back pain.  Discussed turmeric Follow up with Dr. Darene Lamer as needed.   Follow up in 3 months.

## 2021-08-14 NOTE — Addendum Note (Signed)
Addended by: Burton Apley A on: 08/14/2021 11:07 AM   Modules accepted: Orders

## 2021-08-15 LAB — BASIC METABOLIC PANEL WITH GFR
BUN: 12 mg/dL (ref 7–25)
CO2: 29 mmol/L (ref 20–32)
Calcium: 10.5 mg/dL — ABNORMAL HIGH (ref 8.6–10.4)
Chloride: 100 mmol/L (ref 98–110)
Creat: 1 mg/dL (ref 0.50–1.05)
Glucose, Bld: 91 mg/dL (ref 65–99)
Potassium: 4.3 mmol/L (ref 3.5–5.3)
Sodium: 140 mmol/L (ref 135–146)
eGFR: 61 mL/min/{1.73_m2} (ref >=60)

## 2021-08-15 LAB — PTH, INTACT AND CALCIUM
Calcium: 10.5 mg/dL — ABNORMAL HIGH (ref 8.6–10.4)
PTH: 23 pg/mL (ref 16–77)

## 2021-08-15 LAB — HEPATIC FUNCTION PANEL
AG Ratio: 1.4 (calc) (ref 1.0–2.5)
ALT: 114 U/L — ABNORMAL HIGH (ref 6–29)
AST: 97 U/L — ABNORMAL HIGH (ref 10–35)
Albumin: 4.2 g/dL (ref 3.6–5.1)
Alkaline phosphatase (APISO): 62 U/L (ref 37–153)
Bilirubin, Direct: 0.1 mg/dL (ref 0.0–0.2)
Globulin: 3.1 g/dL (calc) (ref 1.9–3.7)
Indirect Bilirubin: 0.4 mg/dL (calc) (ref 0.2–1.2)
Total Bilirubin: 0.5 mg/dL (ref 0.2–1.2)
Total Protein: 7.3 g/dL (ref 6.1–8.1)

## 2021-08-15 LAB — VITAMIN D 25 HYDROXY (VIT D DEFICIENCY, FRACTURES): Vit D, 25-Hydroxy: 49 ng/mL (ref 30–100)

## 2021-08-15 LAB — HEMOGLOBIN A1C
Hgb A1c MFr Bld: 5.5 % of total Hgb (ref <5.7)
Mean Plasma Glucose: 111 mg/dL
eAG (mmol/L): 6.2 mmol/L

## 2021-08-16 NOTE — Progress Notes (Signed)
PTH is low normal. I would cut back on calcium. How much are you taking?  Kidney looks good.  Vitamin D looks good.  A1C is normal.  Your liver enzymes are up.  You do have known fatty liver.  Are you ok with repeat abdominal ultrasound to look for any changes.  Need to work on healthy diet and weight loss.

## 2021-08-18 NOTE — Addendum Note (Signed)
Addended by: Donella Stade on: 08/18/2021 12:07 PM   Modules accepted: Orders

## 2021-08-19 ENCOUNTER — Other Ambulatory Visit: Payer: Self-pay

## 2021-08-19 ENCOUNTER — Encounter (HOSPITAL_BASED_OUTPATIENT_CLINIC_OR_DEPARTMENT_OTHER): Payer: Self-pay | Admitting: Emergency Medicine

## 2021-08-19 ENCOUNTER — Emergency Department (HOSPITAL_BASED_OUTPATIENT_CLINIC_OR_DEPARTMENT_OTHER): Payer: Medicare Other

## 2021-08-19 ENCOUNTER — Emergency Department (HOSPITAL_BASED_OUTPATIENT_CLINIC_OR_DEPARTMENT_OTHER)
Admission: EM | Admit: 2021-08-19 | Discharge: 2021-08-19 | Disposition: A | Discharge status: Home / Self Care | Payer: Medicare Other | Attending: Emergency Medicine | Admitting: Emergency Medicine

## 2021-08-19 DIAGNOSIS — W19XXXA Unspecified fall, initial encounter: Secondary | ICD-10-CM | POA: Insufficient documentation

## 2021-08-19 DIAGNOSIS — Z7951 Long term (current) use of inhaled steroids: Secondary | ICD-10-CM | POA: Diagnosis not present

## 2021-08-19 DIAGNOSIS — M545 Low back pain, unspecified: Secondary | ICD-10-CM | POA: Insufficient documentation

## 2021-08-19 DIAGNOSIS — M25511 Pain in right shoulder: Secondary | ICD-10-CM | POA: Insufficient documentation

## 2021-08-19 DIAGNOSIS — S299XXA Unspecified injury of thorax, initial encounter: Secondary | ICD-10-CM | POA: Diagnosis present

## 2021-08-19 DIAGNOSIS — S2241XA Multiple fractures of ribs, right side, initial encounter for closed fracture: Secondary | ICD-10-CM | POA: Diagnosis not present

## 2021-08-19 DIAGNOSIS — R911 Solitary pulmonary nodule: Secondary | ICD-10-CM

## 2021-08-19 DIAGNOSIS — Z7982 Long term (current) use of aspirin: Secondary | ICD-10-CM | POA: Insufficient documentation

## 2021-08-19 DIAGNOSIS — J449 Chronic obstructive pulmonary disease, unspecified: Secondary | ICD-10-CM | POA: Insufficient documentation

## 2021-08-19 DIAGNOSIS — J45909 Unspecified asthma, uncomplicated: Secondary | ICD-10-CM | POA: Diagnosis not present

## 2021-08-19 HISTORY — DX: Hyperlipidemia, unspecified: E78.5

## 2021-08-19 MED ORDER — FENTANYL CITRATE PF 50 MCG/ML IJ SOSY
50.0000 ug | PREFILLED_SYRINGE | Freq: Once | INTRAMUSCULAR | Status: AC
  Administered 2021-08-19: 50 ug via INTRAMUSCULAR
  Filled 2021-08-19: qty 1

## 2021-08-19 MED ORDER — OXYCODONE HCL 5 MG PO TABS
5.0000 mg | ORAL_TABLET | ORAL | 0 refills | Status: DC | PRN
Start: 2021-08-19 — End: 2021-11-16

## 2021-08-19 NOTE — ED Triage Notes (Signed)
Pt arrives pov with report of mechanical fall yesterday after slipping on wet deck. Pt denies loc, c/o right shoulder pain and right upper rib and back pain. Pt reports pain with deep inspiration. Endorses taking oxycotin '10mg'$  at 0530 with some relief.  ?

## 2021-08-19 NOTE — ED Notes (Signed)
Patient educated on IS and indications for use. Patient demonstrated good effort and understanding.  ?

## 2021-08-19 NOTE — Discharge Instructions (Addendum)
Take Tylenol (acetaminophen) 1000 mg 4 times a day for 1 week. This is the maximum dose of Tylenol (acetaminophen)usually take from all sources. Please check other over-the-counter medications and prescriptions to ensure you are not taking other medications that contain acetaminophen.  You may also take ibuprofen 400 mg 6 times a day alternating with or at the same time as tylenol.  Take oxycodone as needed for breakthrough pain.  This medication can be addicting, sedating and cause constipation.  If you can tolerate a lidocaine patch placed lightly over the area of pain you may also try this over the counter.    ?

## 2021-08-19 NOTE — ED Notes (Signed)
Family in room with pt, side rails x 2 up, call bell within reach. Instructed client not to get up without staff in room ?

## 2021-08-19 NOTE — ED Provider Notes (Signed)
[facility] EMERGENCY DEPARTMENT Provider Note   CSN: [id number] Arrival date & time: 08/19/21  4098     History  Chief Complaint  Patient presents with   Marissa Willis is a 66 y.o. female.  HPI     66 year old female with history of asthma COPD, complex partial seizure disorder, history of cerebral aneurysm repair, hyperlipidemia, OSA who presents with fall with right sided rib pain.   Reports that she slipped on a wet deck yesterday and had a mechanical fall.  Complains of right shoulder pain and right upper rib and back pain.  The pain worsens with inspiration and movement.  Took some of her husband's oxycontin last night and this AM without relief.  She did not hit her head or lose consciousness.  Denies any neck pain.  Reports that she had some lower back pain noted prior to the fall and had discussed with her PCP, and she continues to have some discomfort there.  Denies shortness of breath, just reports difficult to take a deep breath due to pain.  Denies numbness, weakness, difficulty walking, cough, fever, nausea, vomiting, urinary symptoms.  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 Authorizing Provider  oxyCODONE (ROXICODONE) 5 MG immediate release tablet Take 1-2 tablets (5-10 mg total) by mouth every 4  (four) hours as needed for severe pain. 08/19/21  Yes Gareth Morgan, MD  Albuterol Sulfate (PROAIR RESPICLICK) 119 (90 Base) MCG/ACT AEPB Inhale 2 puffs into the lungs every 6 (six) hours as needed (shortness of breath/wheezing). 08/19/20   Donella Stade, PA-C  aspirin EC 81 MG tablet Take 81 mg by mouth daily. Swallow whole.    [provider]  budesonide (PULMICORT) 0.25 MG/2ML nebulizer solution Take 2 mLs (0.25 mg total) by nebulization in the morning and at bedtime. 12/25/19   Breeback, Jade L, PA-C  lisinopril-hydrochlorothiazide (ZESTORETIC) 10-12.5 MG tablet Take 1 tablet by mouth daily. 08/14/21   Breeback, Royetta Car, PA-C  Multiple Vitamin (MULTIVITAMIN) capsule Take 1 capsule by mouth daily.    [provider]  Multiple Vitamins-Minerals (ICAPS) TABS Take by mouth.    [provider]  Pitavastatin Magnesium 2 MG TABS Take 1 tablet by mouth daily. 08/14/21   Breeback, Royetta Car, PA-C      Allergies    Atorvastatin and Crestor [rosuvastatin]    Review of Systems   Review of Systems See above  Physical Exam Updated Vital Signs BP (!) 158/80    Pulse 70    Temp 98.3 F (36.8 C) (Oral)    Resp 18    Ht '5\' 3"'$  (1.6 m)    Wt 90.7 kg    SpO2 95%  BMI 35.43 kg/m  Physical Exam Vitals and nursing note reviewed.  Constitutional:      General: She is not in acute distress.    Appearance: She is well-developed. She is not diaphoretic.  HENT:     Head: Normocephalic and atraumatic.  Eyes:     Conjunctiva/sclera: Conjunctivae normal.  Cardiovascular:     Rate and Rhythm: Normal rate and regular rhythm.     Heart sounds: Normal heart sounds. No murmur heard.   No friction rub. No gallop.  Pulmonary:     Effort: Pulmonary effort is normal. No respiratory distress.     Breath sounds: Normal breath sounds. No wheezing or rales.  Chest:     Chest wall: Tenderness (posterolateral) present.  Abdominal:     General: There is no distension.     Palpations: Abdomen is  soft.     Tenderness: There is no abdominal tenderness. There is no guarding.  Musculoskeletal:        General: Tenderness (ribs right side of back, no midline C/T spine tenderness, low Lspine tenderness) present.     Cervical back: Normal range of motion.  Skin:    General: Skin is warm and dry.     Findings: No erythema or rash.  Neurological:     Mental Status: She is alert and oriented to person, place, and time.    ED Results / Procedures / Treatments   Labs (all labs ordered are listed, but only abnormal results are displayed) Labs Reviewed - No data to display  EKG None  Radiology DG Lumbar Spine Complete  Result Date: 08/19/2021 CLINICAL DATA:  Back pain EXAM: LUMBAR SPINE - COMPLETE 4+ VIEW COMPARISON:  None. FINDINGS: No recent fracture is seen. Alignment of posterior margins of vertebral bodies is unremarkable. Degenerative changes are noted in facet joints, more so at L4-L5 and L5-S1 levels. Extensive arterial calcifications are seen in the aorta and its major branches. IMPRESSION: No recent fracture is seen in the lumbar spine. Degenerative changes are noted in the lumbar spine, more so at L4-L5 and L5-S1 levels. Electronically Signed   By: Elmer Picker M.D.   On: 08/19/2021 11:05   CT Chest Wo Contrast  Result Date: 08/19/2021 CLINICAL DATA:  Severe right-sided pain. EXAM: CT CHEST WITHOUT CONTRAST TECHNIQUE: Multidetector CT imaging of the chest was performed following the standard protocol without IV contrast. RADIATION DOSE REDUCTION: This exam was performed according to the departmental dose-optimization program which includes automated exposure control, adjustment of the mA and/or kV according to patient size and/or use of iterative reconstruction technique. COMPARISON:  None. FINDINGS: Cardiovascular: No significant vascular findings. Normal heart size. No pericardial effusion. Thoracic aortic atherosclerosis. Coronary artery atherosclerosis. Mediastinum/Nodes: No  enlarged mediastinal or axillary lymph nodes. Trachea and esophagus demonstrate no significant findings. 7 mm peripherally calcified left thyroid nodule. No follow-up imaging recommended. Lungs/Pleura: No focal consolidation, pleural effusion or pneumothorax. 3 mm right upper lobe pulmonary nodule. Biapical blebs. Upper Abdomen: No acute upper abdominal abnormality. Diffuse low-attenuation of the liver as can be seen with hepatic steatosis. Musculoskeletal: Age indeterminate T5 vertebral body compression fracture. Small bone island in the T5 vertebral body. Age indeterminate T6 vertebral body compression fracture. Chronic T11 vertebral body compression fracture. Thoracic spine kyphosis centered at T11. Degenerative disease with disc height loss at T10-11 and T11-12. acute mildly displaced right lateral fifth and posterolateral sixth rib fractures. Nondisplaced right posterolateral seventh and eighth rib fractures. IMPRESSION: 1. Acute mildly displaced right lateral fifth and posterolateral  sixth rib fractures. Nondisplaced right posterolateral seventh and eighth rib fractures. 2. Age indeterminate T5 and T6 vertebral body compression fractures. 3. Chronic T11 vertebral body compression fracture. 4. Hepatic steatosis. 5. Aortic Atherosclerosis (ICD10-I70.0). 6. A 3 mm right solid pulmonary nodule within the upper lobe. If patient is low risk for malignancy, no routine follow-up imaging is recommended; if patient is high risk for malignancy, a non-contrast Chest CT at 12 months is optional. If performed and the nodule is stable at 12 months, no further follow-up is recommended. These guidelines do not apply to immunocompromised patients and patients with cancer. Follow up in patients with significant comorbidities as clinically warranted. For lung cancer screening, adhere to Lung-RADS guidelines. Reference: Radiology. 2017; 284(1):228-43. Electronically Signed   By: Kathreen Devoid M.D.   On: 08/19/2021 11:19     Procedures Procedures    Medications Ordered in ED Medications  fentaNYL (SUBLIMAZE) injection 50 mcg (50 mcg Intramuscular Given 08/19/21 1105)    ED Course/ Medical Decision Making/ A&P                             66 year old female with history of asthma COPD, complex partial seizure disorder, history of cerebral aneurysm repair, hyperlipidemia, OSA who presents with fall with right sided rib pain.   No head trauma, anticoagulation, loss of consciousness, neck pain or neurologic symptoms and have low suspicion for intracranial or cervical spine injury.  She had some lower back pain prior to the fall, and x-ray was performed which showed no acute lumbar fracture.  Given significant amount of pain, age greater than 77, history of COPD, and insensitivity of x-ray to determine number of rib fractures, with increased mortality which additional rib fractures, CT of the chest was obtained to evaluate for rib fractures and other complications.  CT was completed and showed acute mildly displaced fractures of the lateral fifth, posterior lateral 6, nondisplaced fractures of the posterior lateral seventh and eighth ribs.  She is noted to have age-indeterminate vertebral body compression fractures, chronic T11 vertebral body compression fracture and a pulmonary nodule for which we recommend follow-up CT in 12 months given history of prior smoking.  Discussed recommendation for admission for pain control and pulmonary toilet given risk factors and number of rib fractures.  She is very clear she wants to go home.  Given her preference and knowing injury was yesterday without signs of other complications today, discussed home management.  She is able to use a spirometer and pull 1700 cc, recommend using the spirometer 10 times an hour.  She is given a prescription for oxycodone with discussion of the risks.  She has good support at home.  Discussed plan for outpatient management, reasons to return to the  emergency department Patient discharged in stable condition with understanding of reasons to return.         Final Clinical Impression(s) / ED Diagnoses Final diagnoses:  Fall, initial encounter  Closed fracture of multiple ribs of right side, initial encounter  Pulmonary nodule    Rx / DC Orders ED Discharge Orders          Ordered    oxyCODONE (ROXICODONE) 5 MG immediate release tablet  Every 4 hours PRN        08/19/21 1149              Gareth Morgan, MD 08/20/21 0710

## 2021-08-21 ENCOUNTER — Telehealth: Payer: Self-pay

## 2021-08-21 NOTE — Telephone Encounter (Signed)
Transition Care Management Unsuccessful Follow-up Telephone Call ? ?Date of discharge and from where:  08/19/2021 from [facility] ? ?Attempts:  1st Attempt ? ?Reason for unsuccessful TCM follow-up call:  Left voice message ? ? ? ?

## 2021-08-23 NOTE — Telephone Encounter (Signed)
Transition Care Management Unsuccessful Follow-up Telephone Call ? ?Date of discharge and from where:  08/19/2021 from [place] ? ?Attempts:  2nd Attempt ? ?Reason for unsuccessful TCM follow-up call:  Left voice message ? ? ? ?

## 2021-08-25 NOTE — Telephone Encounter (Signed)
Transition Care Management Follow-up Telephone Call ?Date of discharge and from where: 08/19/21 from [facility] ?How have you been since you were released from the hospital? Still in pain at times but overall doing ok. ?Any questions or concerns? No ? ?Items Reviewed: ?Did the pt receive and understand the discharge instructions provided? Yes  ?Medications obtained and verified? No  ?Other? No  ?Any new allergies since your discharge? No  ?Dietary orders reviewed? Yes ?Do you have support at home? Yes  ? ?Home Care and Equipment/Supplies: ?Were home health services ordered? not applicable ? ?Functional Questionnaire: (I = Independent and D = Dependent) ?ADLs: I ? ?Bathing/Dressing- I ? ?Meal Prep- I ? ?Eating- I ? ?Maintaining continence- I ? ?Transferring/Ambulation- I ? ?Managing Meds- I ? ?Follow up appointments reviewed: ? ?PCP Hospital f/u appt confirmed? No   ?Specialist Hospital f/u appt confirmed? No   ?Are transportation arrangements needed? No  ?If their condition worsens, is the pt aware to call PCP or go to the Emergency Dept.? Yes ?Was the patient provided with contact information for the PCP's office or ED? Yes ?Was to pt encouraged to call back with questions or concerns? Yes  ?

## 2021-09-20 ENCOUNTER — Other Ambulatory Visit: Payer: Medicare Other

## 2021-11-10 ENCOUNTER — Ambulatory Visit: Payer: Medicare Other | Admitting: Physician Assistant

## 2021-11-17 ENCOUNTER — Ambulatory Visit (INDEPENDENT_AMBULATORY_CARE_PROVIDER_SITE_OTHER): Payer: Medicare Other | Admitting: Physician Assistant

## 2021-11-17 ENCOUNTER — Encounter: Payer: Self-pay | Admitting: Physician Assistant

## 2021-11-17 VITALS — BP 140/98 | HR 96 | Ht 63.0 in | Wt 202.0 lb

## 2021-11-17 DIAGNOSIS — Z13820 Encounter for screening for osteoporosis: Secondary | ICD-10-CM

## 2021-11-17 DIAGNOSIS — I700 Atherosclerosis of aorta: Secondary | ICD-10-CM

## 2021-11-17 DIAGNOSIS — J449 Chronic obstructive pulmonary disease, unspecified: Secondary | ICD-10-CM

## 2021-11-17 DIAGNOSIS — I10 Essential (primary) hypertension: Secondary | ICD-10-CM | POA: Diagnosis not present

## 2021-11-17 DIAGNOSIS — Z1231 Encounter for screening mammogram for malignant neoplasm of breast: Secondary | ICD-10-CM

## 2021-11-17 DIAGNOSIS — H3530 Unspecified macular degeneration: Secondary | ICD-10-CM

## 2021-11-17 DIAGNOSIS — Z780 Asymptomatic menopausal state: Secondary | ICD-10-CM

## 2021-11-17 DIAGNOSIS — E782 Mixed hyperlipidemia: Secondary | ICD-10-CM

## 2021-11-17 DIAGNOSIS — M25551 Pain in right hip: Secondary | ICD-10-CM

## 2021-11-17 DIAGNOSIS — M25552 Pain in left hip: Secondary | ICD-10-CM

## 2021-11-17 DIAGNOSIS — M8580 Other specified disorders of bone density and structure, unspecified site: Secondary | ICD-10-CM

## 2021-11-17 DIAGNOSIS — G8929 Other chronic pain: Secondary | ICD-10-CM

## 2021-11-17 DIAGNOSIS — M545 Low back pain, unspecified: Secondary | ICD-10-CM

## 2021-11-17 DIAGNOSIS — Z6835 Body mass index (BMI) 35.0-35.9, adult: Secondary | ICD-10-CM

## 2021-11-17 DIAGNOSIS — R918 Other nonspecific abnormal finding of lung field: Secondary | ICD-10-CM

## 2021-11-17 MED ORDER — MELOXICAM 15 MG PO TABS: 15.0000 mg | ORAL_TABLET | Freq: Every day | ORAL | 0 refills | Status: DC

## 2021-11-17 NOTE — Patient Instructions (Signed)
Schedule bone density  Vitamin D goal 1000 units  Vitamin C '1200mg'$ 

## 2021-11-17 NOTE — Progress Notes (Signed)
Established Patient Office Visit  Subjective   Patient ID: Marissa Willis, female    DOB: Apr 06, 1956  Age: 66 y.o. MRN: [id number]  Chief Complaint  Patient presents with   Follow-up    HPI Pt is a 66 yo obese female with HTN, COPD, HLD, Chronic low back and hip pain who presents to the clinic for follow up.   Trelegy was over 600 dollars a month. She cannot afford. She admits she is not doing her budesonide daily. She does use her rescue inhaler most days at least once. She is easily winded. She has a productive cough most days. She has been 4 years without smoking. She has had screening CT and found benign appearing pulmonary nodules.   She denies any CP, palpitations, headaches, or vision changes.   She continues to have a lot of hip and low back pain due to arthritis. She is not taking anything for this.     GFR good 61    CT low dose Bone density Colonoscopy     OSA ?  [name] KitchenMarland Kitchen Active Ambulatory Problems    Diagnosis Date Noted   Stage 3 severe COPD by GOLD classification ([place]) 12/26/2012   Asthma 12/26/2012   Complex partial seizure ([place]) 03/02/2013   Former smoker 12/09/2013   Mixed hyperlipidemia 12/09/2013   Aortic atherosclerosis ([place]) 12/28/2014   Bilateral lower extremity edema 07/06/2015   Renal calculi 12/09/2015   Kidney calculi 01/12/2016   Finger fracture, right 08/24/2016   Bilateral hip pain 09/11/2017   Epigastric pain 09/11/2017   RUQ pain 09/11/2017   Class 2 severe obesity due to excess calories with serious comorbidity and body mass index (BMI) of 35.0 to 35.9 in adult ([place]) 09/11/2017   Hepatic steatosis 09/16/2017   Cyst near tailbone 02/17/2018   Generalized intra-abdominal and pelvic swelling, mass and lump 02/17/2018   Polyp of sigmoid colon 02/17/2018   Internal hemorrhoids 02/17/2018   Diverticulosis 03/04/2018   Benign essential hypertension 03/20/2019   Adenomatous polyp of colon 09/08/2019   Low serum vitamin B12 09/09/2019    Elevated fasting glucose 09/09/2019   SOB (shortness of breath) on exertion 09/09/2019   Memory changes 09/09/2019   Fatigue 09/09/2019   Age related osteoporosis 09/09/2019   Post-menopausal 09/09/2019   Osteopenia 09/21/2019   Left ventricular hypertrophy 09/22/2019   Claudication of both lower extremities ([place]) 08/22/2020   Hyperlipidemia associated with type 2 diabetes mellitus ([place]) 08/22/2020   Dysfunction of both eustachian tubes 04/26/2021   Nasal congestion 04/26/2021   Serum calcium elevated 08/14/2021   Elevated liver enzymes 08/14/2021   Chronic bilateral low back pain without sciatica 08/14/2021   Osteoporosis screening 08/14/2021   Resolved Ambulatory Problems    Diagnosis Date Noted   Hand injury, right, initial encounter 08/10/2016   Past Medical History:  Diagnosis Date   Asthma with COPD ([place])    Complex partial seizure disorder [facility]) neurologist-  dr Krista Blue   History of cerebral aneurysm repair    History of kidney stones    History of partial seizures    Hyperlipidemia    OSA on CPAP    Retained ureteral stent    SUI (stress urinary incontinence, female)        ROS See HPI.    Objective:     BP (!) 140/98   Pulse 96   Ht '5\' 3"'$  (1.6 m)   Wt 202 lb (91.6 kg)   SpO2 99%   BMI 35.78 kg/m  BP Readings  from Last 3 Encounters:  11/17/21 (!) 140/98  08/19/21 (!) 158/80  08/14/21 135/76   Wt Readings from Last 3 Encounters:  11/17/21 202 lb (91.6 kg)  08/19/21 200 lb (90.7 kg)  08/14/21 201 lb (91.2 kg)      Physical Exam Vitals reviewed.  Constitutional:      Appearance: Normal appearance. She is obese.  HENT:     Head: Normocephalic.  Cardiovascular:     Rate and Rhythm: Normal rate and regular rhythm.     Pulses: Normal pulses.     Heart sounds: Normal heart sounds.  Pulmonary:     Effort: Pulmonary effort is normal.     Breath sounds: Normal breath sounds.  Musculoskeletal:     Right lower leg: No edema.     Left lower leg:  No edema.  Neurological:     General: No focal deficit present.     Mental Status: She is alert and oriented to person, place, and time.  Psychiatric:        Mood and Affect: Mood normal.       The 10-year ASCVD risk score (Arnett DK, et al., 2019) is: 21.4%    Assessment & Plan:  [name] KitchenMarland KitchenAveryanna was seen today for follow-up.  Diagnoses and all orders for this visit:  Stage 3 severe COPD by GOLD classification ([place])  Aortic atherosclerosis ([place])  Benign essential hypertension  Osteoporosis screening  Post-menopausal  Class 2 severe obesity due to excess calories with serious comorbidity and body mass index (BMI) of 35.0 to 35.9 in adult [facility])  Chronic bilateral low back pain without sciatica -     meloxicam (MOBIC) 15 MG tablet; Take 1 tablet (15 mg total) by mouth daily.  Bilateral hip pain -     meloxicam (MOBIC) 15 MG tablet; Take 1 tablet (15 mg total) by mouth daily.   Aortic atherosclerosis on livalo   Pulmonary nodules appear benign, suggested CT at 12 months.  Pt not smoking Needs to use budesonide at least once a day if not twice a day to help with SOB ? Pharmacy referral to see if we can get trelegy approved  Suggested NSAID for arthritis GFR good at 61 Start mobic Needs bone density and mammogram-both ordered  BP not to goal Continue hyzaar  Work on low salt diet Stay on ASA   Return in about 3 months (around 02/17/2022).    Iran Planas, PA-C

## 2021-11-22 ENCOUNTER — Encounter: Payer: Self-pay | Admitting: Physician Assistant

## 2021-11-22 DIAGNOSIS — R918 Other nonspecific abnormal finding of lung field: Secondary | ICD-10-CM | POA: Insufficient documentation

## 2021-11-22 DIAGNOSIS — H3530 Unspecified macular degeneration: Secondary | ICD-10-CM | POA: Insufficient documentation

## 2021-11-22 HISTORY — DX: Other nonspecific abnormal finding of lung field: R91.8

## 2021-11-27 ENCOUNTER — Telehealth: Payer: Self-pay | Admitting: *Deleted

## 2021-11-27 NOTE — Chronic Care Management (AMB) (Signed)
  Care Management   Outreach Note  11/27/2021 Name: Marissa Willis MRN: [id number] DOB: Mar 06, 1956  Referred by: Donella Stade, PA-C Reason for referral : Care Coordination (Initial outreach to schedule referral with [facility] )   An unsuccessful telephone outreach was attempted today. The patient was referred to the case management team for assistance with care management and care coordination.   Follow Up Plan:  A HIPAA compliant phone message was left for the patient providing contact information and requesting a return call.   Julian Hy, [place] Management  Direct Dial: [phone]

## 2021-12-01 NOTE — Chronic Care Management (AMB) (Unsigned)
  Care Management   Outreach Note  12/01/2021 Name: Marissa Willis MRN: [id number] DOB: [birth year]  Referred by: Donella Stade, PA-C Reason for referral : Care Coordination (Initial outreach to schedule referral with [facility] )   A second unsuccessful telephone outreach was attempted today. The patient was referred to the case management team for assistance with care management and care coordination.   Follow Up Plan:  A HIPAA compliant phone message was left for the patient providing contact information and requesting a return call.   Julian Hy, [place] Management  Direct Dial: [phone]

## 2021-12-06 NOTE — Chronic Care Management (AMB) (Signed)
  Care Management   Outreach Note  12/06/2021 Name: Marissa Willis MRN: [id number] DOB: [birth year]  Referred by: Donella Stade, PA-C Reason for referral : Care Coordination (Initial outreach to schedule referral with [facility] )   Third unsuccessful telephone outreach was attempted today. The patient was referred to the case management team for assistance with care management and care coordination. The patient's primary care provider has been notified of our unsuccessful attempts to make or maintain contact with the patient. The care management team is pleased to engage with this patient at any time in the future should he/she be interested in assistance from the care management team.   Follow Up Plan:  We have been unable to make contact with the patient for follow up. The care management team is available to follow up with the patient after provider conversation with the patient regarding recommendation for care management engagement and subsequent re-referral to the care management team.   Julian Hy, [place] Management  Direct Dial: [phone]

## 2022-01-31 ENCOUNTER — Other Ambulatory Visit: Payer: Self-pay | Admitting: Physician Assistant

## 2022-01-31 ENCOUNTER — Other Ambulatory Visit (HOSPITAL_BASED_OUTPATIENT_CLINIC_OR_DEPARTMENT_OTHER): Payer: Self-pay

## 2022-01-31 MED ORDER — PROAIR RESPICLICK 108 (90 BASE) MCG/ACT IN AEPB
INHALATION_SPRAY | RESPIRATORY_TRACT | 1 refills | Status: DC
  Filled 2022-01-31: qty 1, 30d supply, fill #0
  Filled 2022-05-14: qty 1, 30d supply, fill #1

## 2022-02-01 ENCOUNTER — Other Ambulatory Visit (HOSPITAL_BASED_OUTPATIENT_CLINIC_OR_DEPARTMENT_OTHER): Payer: Self-pay

## 2022-02-07 ENCOUNTER — Encounter: Payer: Self-pay | Admitting: General Practice

## 2022-05-08 ENCOUNTER — Ambulatory Visit
Admission: RE | Admit: 2022-05-08 | Discharge: 2022-05-08 | Disposition: A | Discharge status: Home / Self Care | Payer: Medicare Other | Source: Ambulatory Visit | Attending: Physician Assistant | Admitting: Physician Assistant

## 2022-05-08 VITALS — BP 149/63 | HR 86 | Temp 100.0°F | Resp 18 | Ht 62.0 in | Wt 200.0 lb

## 2022-05-08 DIAGNOSIS — U071 COVID-19: Secondary | ICD-10-CM | POA: Diagnosis not present

## 2022-05-08 MED ORDER — BENZONATATE 100 MG PO CAPS
100.0000 mg | ORAL_CAPSULE | Freq: Three times a day (TID) | ORAL | 0 refills | Status: DC
Start: 2022-05-08 — End: 2022-08-27

## 2022-05-08 MED ORDER — FLUTICASONE PROPIONATE 50 MCG/ACT NA SUSP: 1.0000 | Freq: Every day | NASAL | 0 refills | Status: DC

## 2022-05-08 NOTE — Discharge Instructions (Signed)
Use Tessalon for cough.  Use Flonase to help with your congestion.  You can use over-the-counter medications including Mucinex for additional symptom relief.  Continue your medications as prescribed.  Make sure you have access to your albuterol inhaler.  If you change your mind about antiviral therapy within the next few days you can contact us.  Follow-up with your primary care next week if symptoms have not resolved.  Please obtain a pulse oximeter from the pharmacy.  Monitor your oxygen saturation at home.  If this drops below 90% you need to go to the emergency room.  If you develop any worsening symptoms including shortness of breath, weakness, nausea, vomiting, chest pain you need to go to the ER.

## 2022-05-08 NOTE — ED Triage Notes (Signed)
Patient c/o cough, congestion, nasal drainage, watery eyes and body aches this am.  Patient did a home COVID test this morning and it was positive.  Husband tested positive on Saturday.  Patient has taken Mucinex and Tylenol.

## 2022-05-08 NOTE — ED Provider Notes (Signed)
[name]    CSN: [id number] Arrival date & time: 05/08/22  1247      History   Chief Complaint Chief Complaint  Patient presents with   Cough    Covid Positive 05/08/2022 - Entered by patient    HPI Marissa Willis is a 66 y.o. female.   Patient presents today with a 12-hour history of URI symptoms including cough, congestion, fever, fatigue, malaise.  She took an at home COVID test that was positive.  Reports that her husband began developing symptoms a few days ago and he also tested positive for COVID.  She has not had COVID in the past but has had COVID-19 vaccines.  She has a history of asthma with COPD.  She is a former smoker.  Denies diabetes, immunosuppression, chronic liver/kidney disease.  She has not been taking any over-the-counter medications for symptom management.  Denies any recent antibiotics or steroid use.      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   History of kidney stones    History of partial seizures    x2 seizure in 2014  dx complex parital seizures started on keppra-- per pt no seizures since off keppra   Hyperlipidemia    OSA on CPAP    Retained ureteral stent    right side migrated   SUI (stress urinary incontinence, female)    mild    Patient Active Problem List   Diagnosis Date Noted   Pulmonary nodules 11/22/2021   Macular degeneration of both eyes 11/22/2021   Serum calcium elevated 08/14/2021   Elevated liver enzymes 08/14/2021   Chronic bilateral low back pain without sciatica 08/14/2021   Osteoporosis screening 08/14/2021   Dysfunction of both eustachian tubes 04/26/2021   Nasal congestion 04/26/2021   Claudication of both lower extremities ([place]) 08/22/2020   Left  ventricular hypertrophy 09/22/2019   Osteopenia 09/21/2019   Low serum vitamin B12 09/09/2019   Elevated fasting glucose 09/09/2019   SOB (shortness of breath) on exertion 09/09/2019   Memory changes 09/09/2019   Fatigue 09/09/2019   Age related osteoporosis 09/09/2019   Post-menopausal 09/09/2019   Adenomatous polyp of colon 09/08/2019   Benign essential hypertension 03/20/2019   Diverticulosis 03/04/2018   Cyst near tailbone 02/17/2018   Generalized intra-abdominal and pelvic swelling, mass and lump 02/17/2018   Polyp of sigmoid colon 02/17/2018   Internal hemorrhoids 02/17/2018   Hepatic steatosis 09/16/2017   Bilateral hip pain 09/11/2017   Epigastric pain 09/11/2017   RUQ pain 09/11/2017   Class 2 severe obesity due to excess calories with serious comorbidity and body mass index (BMI) of 35.0 to 35.9 in adult [facility]) 09/11/2017   Finger fracture, right 08/24/2016   Kidney calculi 01/12/2016   Renal calculi 12/09/2015   Bilateral lower extremity edema 07/06/2015   Aortic atherosclerosis ([place]) 12/28/2014   Former smoker 12/09/2013   Mixed hyperlipidemia 12/09/2013   Complex partial seizure ([place]) 03/02/2013   Stage 3 severe COPD by GOLD classification ([place]) 12/26/2012   Asthma 12/26/2012    Past Surgical History:  Procedure Laterality Date   [place]   CEREBRAL EMBOLIZATION  2011  at [facility]   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   CYSTOSCOPY WITH URETEROSCOPY AND STENT PLACEMENT Right 12/09/2015   Procedure: CYSTOSCOPY WITH retrograde AND STENT PLACEMENT;  Surgeon: Franchot Gallo, MD;  Location: [facility];  Service: Urology;  Laterality: Right;   CYSTOSCOPY WITH URETEROSCOPY AND STENT PLACEMENT Right 02/23/2016   Procedure: CYSTOSCOPY  AND STENT EXTRACTION;  Surgeon: Franchot Gallo, MD;  Location: [facility];  Service: Urology;  Laterality: Right;   IR GENERIC HISTORICAL  01/12/2016   IR  URETERAL STENT RIGHT NEW ACCESS W/O SEP NEPHROSTOMY CATH 01/12/2016 Corrie Mckusick, DO [facility]   NEPHROLITHOTOMY Right 01/12/2016   Procedure: RIGHT  PERCUTANEOUS NEPHROLITHOTOMY  insertion double j stent flexible cystoscopy  removable right double j stent;  Surgeon: Franchot Gallo, MD;  Location: [facility];  Service: Urology;  Laterality: Right;   TUBAL LIGATION  1991    OB History   No obstetric history on file.      Home Medications    Prior to Admission medications   Medication Sig Start Date End Date Taking? Authorizing Provider  Albuterol Sulfate (PROAIR RESPICLICK) 497 (90 Base) MCG/ACT AEPB Inhale 2 puffs into the lungs every 6 (six) hours as needed (shortness of breath/wheezing). 08/19/20  Yes Breeback, Jade L, PA-C  Albuterol Sulfate (PROAIR RESPICLICK) 026 (90 Base) MCG/ACT AEPB Inhale 2 puffs by mouth into the lungs every 6 hours as needed for shortness of breath and wheezing 01/31/22  Yes Breeback, Jade L, PA-C  aspirin EC 81 MG tablet Take 81 mg by mouth daily. Swallow whole.   Yes [provider]  benzonatate (TESSALON) 100 MG capsule Take 1 capsule (100 mg total) by mouth every 8 (eight) hours. 05/08/22  Yes Wilbur Oakland K, PA-C  budesonide (PULMICORT) 0.25 MG/2ML nebulizer solution Take 2 mLs (0.25 mg total) by nebulization in the morning and at bedtime. 12/25/19  Yes Breeback, Jade L, PA-C  fluticasone (FLONASE) 50 MCG/ACT nasal spray Place 1 spray into both nostrils daily. 05/08/22  Yes Ariyan Brisendine K, PA-C  lisinopril-hydrochlorothiazide (ZESTORETIC) 10-12.5 MG tablet Take 1 tablet by mouth daily. 08/14/21  Yes Breeback, Jade L, PA-C  meloxicam (MOBIC) 15 MG tablet Take 1 tablet (15 mg total) by mouth daily. 11/17/21  Yes Breeback, Jade L, PA-C  Multiple Vitamin (MULTIVITAMIN) capsule Take 1 capsule by mouth daily.   Yes [provider]  Multiple Vitamins-Minerals (ICAPS) TABS Take by mouth.   Yes [provider]  Pitavastatin Magnesium 2 MG TABS  Take 1 tablet by mouth daily. 08/14/21  Yes Breeback, Royetta Car, PA-C    Family History Family History  Problem Relation Age of Onset   Diabetes Mother    Heart disease Mother    Hypertension Mother    Heart disease Father     Social History Social History   Tobacco Use   Smoking status: Former    Packs/day: 1.50    Years: 45.00    Total pack years: 67.50    Types: Cigarettes    Quit date: 08/07/2017    Years since quitting: 4.7   Smokeless tobacco: Never  Vaping Use   Vaping Use: Never used  Substance Use Topics   Alcohol use: Yes    Comment: 1-2 drinks daily   Drug use: Yes    Types: Marijuana     Allergies   Atorvastatin and Crestor [rosuvastatin]   Review of Systems Review of Systems  Constitutional:  Positive for activity change, fatigue and fever. Negative for  appetite change.  HENT:  Positive for congestion, sinus pressure and sore throat. Negative for sneezing.   Respiratory:  Positive for cough. Negative for shortness of breath.   Cardiovascular:  Negative for chest pain.  Gastrointestinal:  Negative for abdominal pain, diarrhea, nausea and vomiting.     Physical Exam Triage Vital Signs ED Triage Vitals  Enc Vitals Group     BP 05/08/22 1308 (!) 149/63     Pulse Rate 05/08/22 1308 86     Resp 05/08/22 1308 18     Temp 05/08/22 1308 100 F (37.8 C)     Temp Source 05/08/22 1308 Oral     SpO2 05/08/22 1308 94 %     Weight 05/08/22 1309 200 lb (90.7 kg)     Height 05/08/22 1309 '5\' 2"'$  (1.575 m)     Head Circumference --      Peak Flow --      Pain Score 05/08/22 1308 3     Pain Loc --      Pain Edu? --      Excl. in [place]? --    No data found.  Updated Vital Signs BP (!) 149/63 (BP Location: Right Arm)   Pulse 86   Temp 100 F (37.8 C) (Oral)   Resp 18   Ht '5\' 2"'$  (1.575 m)   Wt 200 lb (90.7 kg)   SpO2 94%   BMI 36.58 kg/m   Visual Acuity Right Eye Distance:   Left Eye Distance:   Bilateral Distance:    Right Eye Near:   Left Eye  Near:    Bilateral Near:     Physical Exam Vitals reviewed.  Constitutional:      General: She is awake. She is not in acute distress.    Appearance: Normal appearance. She is well-developed. She is not ill-appearing.     Comments: Very pleasant female appears stated age in no acute distress sitting comfortably in exam room  HENT:     Head: Normocephalic and atraumatic.     Right Ear: Tympanic membrane, ear canal and external ear normal. Tympanic membrane is not erythematous or bulging.     Left Ear: Tympanic membrane, ear canal and external ear normal. Tympanic membrane is not erythematous or bulging.     Nose:     Right Sinus: No maxillary sinus tenderness or frontal sinus tenderness.     Left Sinus: No maxillary sinus tenderness or frontal sinus tenderness.     Mouth/Throat:     Pharynx: Uvula midline. No oropharyngeal exudate or posterior oropharyngeal erythema.  Cardiovascular:     Rate and Rhythm: Normal rate and regular rhythm.     Heart sounds: Normal heart sounds, S1 normal and S2 normal. No murmur heard. Pulmonary:     Effort: Pulmonary effort is normal.     Breath sounds: Normal breath sounds. No wheezing, rhonchi or rales.     Comments: Clear to auscultation bilaterally Psychiatric:        Behavior: Behavior is cooperative.      UC Treatments / Results  Labs (all labs ordered are listed, but only abnormal results are displayed) Labs Reviewed - No data to display  EKG   Radiology No results found.  Procedures Procedures (including critical care time)  Medications Ordered in UC Medications - No data to display  Initial Impression / Assessment and Plan / UC Course  I have reviewed the triage vital signs and the nursing notes.  Pertinent labs & imaging results that  were available during my care of the patient were reviewed by me and considered in my medical decision making (see chart for details).     Patient has a positive for COVID-19 at home so  additional testing was deferred as this is the likely cause of symptoms.  We discussed that given her age and chronic respiratory illness that she is a candidate for antiviral therapies.  We discussed pros and cons of Paxlovid and molnupiravir the patient declined initiating either of these medications at this time.  Unfortunately, we do not have a recent kidney function within the last 6 months and since she is not interested in starting Paxlovid BMP was not obtained.  We did discuss that if she is interested in starting molnupiravir she can contact us within the next few days to consider this.  She was encouraged to use symptomatic treatment and prescriptions for Flonase and Tessalon were sent to pharmacy.  She can use Mucinex for additional symptom relief.  She is to rest and drink plenty of fluid.  Recommend she obtain a pulse oximeter to monitor oxygen saturation.  We discussed that if it drops below 90% she must go to the emergency room immediately.  If she has any worsening symptoms including chest pain, shortness of breath, weakness, nausea/vomiting or peripheral intake she needs to go to the ER immediately.  Strict return precautions given.  We discussed social distancing/quarantining protocols per CDC guidelines.  Final Clinical Impressions(s) / UC Diagnoses   Final diagnoses:  [id number]     Discharge Instructions      Use Tessalon for cough.  Use Flonase to help with your congestion.  You can use over-the-counter medications including Mucinex for additional symptom relief.  Continue your medications as prescribed.  Make sure you have access to your albuterol inhaler.  If you change your mind about antiviral therapy within the next few days you can contact us.  Follow-up with your primary care next week if symptoms have not resolved.  Please obtain a pulse oximeter from the pharmacy.  Monitor your oxygen saturation at home.  If this drops below 90% you need to go to the emergency room.  If you  develop any worsening symptoms including shortness of breath, weakness, nausea, vomiting, chest pain you need to go to the ER.    ED Prescriptions     Medication Sig Dispense Auth. Provider   benzonatate (TESSALON) 100 MG capsule Take 1 capsule (100 mg total) by mouth every 8 (eight) hours. 21 capsule Lucciana Head K, PA-C   fluticasone (FLONASE) 50 MCG/ACT nasal spray Place 1 spray into both nostrils daily. 16 g Cheral Cappucci K, PA-C      PDMP not reviewed this encounter.   Terrilee Croak, PA-C 05/08/22 1336

## 2022-05-09 ENCOUNTER — Telehealth: Payer: Self-pay | Admitting: Emergency Medicine

## 2022-05-09 NOTE — Telephone Encounter (Signed)
Call to see how Marissa Willis was doing today - feeling better today - denies need for an antiviral at this time, thanked RN for the follow up call

## 2022-05-14 ENCOUNTER — Other Ambulatory Visit (HOSPITAL_BASED_OUTPATIENT_CLINIC_OR_DEPARTMENT_OTHER): Payer: Self-pay

## 2022-05-15 ENCOUNTER — Other Ambulatory Visit (HOSPITAL_BASED_OUTPATIENT_CLINIC_OR_DEPARTMENT_OTHER): Payer: Self-pay

## 2022-07-19 ENCOUNTER — Encounter: Payer: Self-pay | Admitting: *Deleted

## 2022-08-09 ENCOUNTER — Telehealth: Payer: Self-pay | Admitting: Physician Assistant

## 2022-08-09 ENCOUNTER — Other Ambulatory Visit: Payer: Self-pay | Admitting: Physician Assistant

## 2022-08-09 DIAGNOSIS — Z13820 Encounter for screening for osteoporosis: Secondary | ICD-10-CM

## 2022-08-09 DIAGNOSIS — I700 Atherosclerosis of aorta: Secondary | ICD-10-CM

## 2022-08-09 DIAGNOSIS — Z780 Asymptomatic menopausal state: Secondary | ICD-10-CM

## 2022-08-09 NOTE — Telephone Encounter (Signed)
Patient is requesting refills on Pitavastatin Magnesium 57m I will schedule her an appointment in March

## 2022-08-09 NOTE — Telephone Encounter (Signed)
Received from pharmacy and sent.

## 2022-08-14 ENCOUNTER — Other Ambulatory Visit: Payer: Self-pay | Admitting: Physician Assistant

## 2022-08-14 DIAGNOSIS — R748 Abnormal levels of other serum enzymes: Secondary | ICD-10-CM

## 2022-08-17 LAB — HM COLONOSCOPY

## 2022-08-28 ENCOUNTER — Ambulatory Visit (INDEPENDENT_AMBULATORY_CARE_PROVIDER_SITE_OTHER): Payer: Medicare Other | Admitting: Physician Assistant

## 2022-08-28 ENCOUNTER — Encounter: Payer: Self-pay | Admitting: Physician Assistant

## 2022-08-28 VITALS — BP 138/72 | HR 72 | Ht 62.0 in | Wt 210.0 lb

## 2022-08-28 DIAGNOSIS — I10 Essential (primary) hypertension: Secondary | ICD-10-CM | POA: Diagnosis not present

## 2022-08-28 DIAGNOSIS — Z79899 Other long term (current) drug therapy: Secondary | ICD-10-CM | POA: Diagnosis not present

## 2022-08-28 DIAGNOSIS — R7301 Impaired fasting glucose: Secondary | ICD-10-CM

## 2022-08-28 DIAGNOSIS — J449 Chronic obstructive pulmonary disease, unspecified: Secondary | ICD-10-CM

## 2022-08-28 DIAGNOSIS — I700 Atherosclerosis of aorta: Secondary | ICD-10-CM

## 2022-08-28 DIAGNOSIS — Z87891 Personal history of nicotine dependence: Secondary | ICD-10-CM

## 2022-08-28 DIAGNOSIS — M25551 Pain in right hip: Secondary | ICD-10-CM

## 2022-08-28 DIAGNOSIS — M2550 Pain in unspecified joint: Secondary | ICD-10-CM

## 2022-08-28 DIAGNOSIS — J309 Allergic rhinitis, unspecified: Secondary | ICD-10-CM

## 2022-08-28 DIAGNOSIS — M25552 Pain in left hip: Secondary | ICD-10-CM

## 2022-08-28 DIAGNOSIS — Z780 Asymptomatic menopausal state: Secondary | ICD-10-CM

## 2022-08-28 DIAGNOSIS — E782 Mixed hyperlipidemia: Secondary | ICD-10-CM

## 2022-08-28 DIAGNOSIS — Z1329 Encounter for screening for other suspected endocrine disorder: Secondary | ICD-10-CM

## 2022-08-28 MED ORDER — UMECLIDINIUM-VILANTEROL 62.5-25 MCG/ACT IN AEPB: 1.0000 | INHALATION_SPRAY | Freq: Every day | RESPIRATORY_TRACT | 5 refills | Status: DC

## 2022-08-28 MED ORDER — LISINOPRIL-HYDROCHLOROTHIAZIDE 10-12.5 MG PO TABS: 1.0000 | ORAL_TABLET | Freq: Every day | ORAL | 3 refills | Status: DC

## 2022-08-28 MED ORDER — ZYPITAMAG 2 MG PO TABS: 1.0000 | ORAL_TABLET | Freq: Every day | ORAL | 3 refills | Status: DC

## 2022-08-28 MED ORDER — FLUTICASONE PROPIONATE 50 MCG/ACT NA SUSP: 1.0000 | Freq: Every day | NASAL | 0 refills | Status: DC

## 2022-08-28 NOTE — Patient Instructions (Signed)
Will refer for lung cancer screening Start tumeric '500mg'$  bid for arthritis pain ok to use tylenol as needed Start anoro for COPD

## 2022-08-28 NOTE — Progress Notes (Signed)
Established Patient Office Visit  Subjective   Patient ID: Marissa Willis, female    DOB: [birth year]  Age: 67 y.o. MRN: [id number]  Chief Complaint  Patient presents with   Follow-up    HPI Pt is a 67 yo female with COPD, HTN, aortic atherosclerosis who presents to the clinic for follow up.   Pt is not taking any daily inhalers for COPD because she says they "make her worse" and "make her lungs rattle". She is using albuterol daily. She does not smoke any more. She is SOB with very little exertion.   She is compliant with medications.   She is "achy all over in joints".   She does not have any CP, palpitations, headaches or vision changes.      Active Ambulatory Problems    Diagnosis Date Noted   Stage 3 severe COPD by GOLD classification ([place]) 12/26/2012   Asthma 12/26/2012   Complex partial seizure ([place]) 03/02/2013   Former smoker 12/09/2013   Mixed hyperlipidemia 12/09/2013   Aortic atherosclerosis ([place]) 12/28/2014   Bilateral lower extremity edema 07/06/2015   Renal calculi 12/09/2015   Kidney calculi 01/12/2016   Finger fracture, right 08/24/2016   Bilateral hip pain 09/11/2017   Epigastric pain 09/11/2017   RUQ pain 09/11/2017   Class 2 severe obesity due to excess calories with serious comorbidity and body mass index (BMI) of 35.0 to 35.9 in adult ([place]) 09/11/2017   Hepatic steatosis 09/16/2017   Cyst near tailbone 02/17/2018   Generalized intra-abdominal and pelvic swelling, mass and lump 02/17/2018   Polyp of sigmoid colon 02/17/2018   Internal hemorrhoids 02/17/2018   Diverticulosis 03/04/2018   Benign essential hypertension 03/20/2019   Adenomatous polyp of colon 09/08/2019   Low serum vitamin B12 09/09/2019   Elevated fasting glucose 09/09/2019   SOB (shortness of breath) on exertion 09/09/2019   Memory changes 09/09/2019   Fatigue 09/09/2019   Age related osteoporosis 09/09/2019   Post-menopausal 09/09/2019   Osteopenia 09/21/2019   Left  ventricular hypertrophy 09/22/2019   Claudication of both lower extremities ([place]) 08/22/2020   Dysfunction of both eustachian tubes 04/26/2021   Nasal congestion 04/26/2021   Serum calcium elevated 08/14/2021   Elevated liver enzymes 08/14/2021   Chronic bilateral low back pain without sciatica 08/14/2021   Osteoporosis screening 08/14/2021   Pulmonary nodules 11/22/2021   Macular degeneration of both eyes 11/22/2021   Arthralgia 08/31/2022   Resolved Ambulatory Problems    Diagnosis Date Noted   Hand injury, right, initial encounter 08/10/2016   Hyperlipidemia associated with type 2 diabetes mellitus ([place]) 08/22/2020   Past Medical History:  Diagnosis Date   Asthma with COPD    Complex partial seizure disorder [facility]) neurologist-  dr Krista Blue   History of cerebral aneurysm repair    History of kidney stones    History of partial seizures    Hyperlipidemia    OSA on CPAP    Retained ureteral stent    SUI (stress urinary incontinence, female)      ROS   See HPI.  Objective:     BP 138/72   Pulse 72   Ht 5\' 2"  (1.575 m)   Wt 210 lb (95.3 kg)   SpO2 93%   BMI 38.41 kg/m  BP Readings from Last 3 Encounters:  08/28/22 138/72  05/08/22 (!) 149/63  11/17/21 (!) 140/98   Wt Readings from Last 3 Encounters:  08/28/22 210 lb (95.3 kg)  05/08/22 200 lb (90.7 kg)  11/17/21 202  lb (91.6 kg)      Physical Exam Constitutional:      Appearance: Normal appearance. She is obese.  HENT:     Head: Normocephalic.  Neck:     Vascular: No carotid bruit.  Cardiovascular:     Rate and Rhythm: Normal rate and regular rhythm.  Pulmonary:     Effort: Pulmonary effort is normal.     Breath sounds: Normal breath sounds.  Musculoskeletal:     Cervical back: Normal range of motion and neck supple. No rigidity or tenderness.     Right lower leg: No edema.     Left lower leg: No edema.  Lymphadenopathy:     Cervical: No cervical adenopathy.  Neurological:     General: No focal  deficit present.     Mental Status: She is alert and oriented to person, place, and time.  Psychiatric:        Mood and Affect: Mood normal.         The 10-year ASCVD risk score (Arnett DK, et al., 2019) is: 37.5%    Assessment & Plan:  [name] KitchenMarland KitchenIdellar was seen today for follow-up.  Diagnoses and all orders for this visit:  Benign essential hypertension -     COMPLETE METABOLIC PANEL WITH GFR -     lisinopril-hydrochlorothiazide (ZESTORETIC) 10-12.5 MG tablet; Take 1 tablet by mouth daily.  Mixed hyperlipidemia -     Lipid Panel w/reflex Direct LDL  Elevated fasting glucose -     COMPLETE METABOLIC PANEL WITH GFR  Medication management -     TSH -     Lipid Panel w/reflex Direct LDL -     COMPLETE METABOLIC PANEL WITH GFR -     CBC with Differential/Platelet  Thyroid disorder screen -     TSH  Post-menopausal  Aortic atherosclerosis (HCC) -     Discontinue: Pitavastatin Magnesium (ZYPITAMAG) 2 MG TABS; Take 1 tablet by mouth daily.  Bilateral hip pain -     DG HIPS BILAT WITH PELVIS 3-4 VIEWS; Future  Stage 3 severe COPD by GOLD classification (HCC) -     umeclidinium-vilanterol (ANORO ELLIPTA) 62.5-25 MCG/ACT AEPB; Inhale 1 puff into the lungs daily at 6 (six) AM. -     Ambulatory Referral Lung Cancer Screening [facility]  Former smoker -     Ambulatory Referral Lung Cancer Screening [facility]  Chronic allergic rhinitis -     fluticasone (FLONASE) 50 MCG/ACT nasal spray; Place 1 spray into both nostrils daily.  Arthralgia, unspecified joint   Vitals look good Refilled medications Fasting labs ordered today Former smoker and COPD- low dose lung cancer CT screening ordered Start anoro without steroid component that patient is concerned about  Ok to use turmeric for overall joint pain, follow up as needed  Flonase for rhinitis Follow up in 3 months   Return in about 3 months (around 11/28/2022).    Iran Planas, PA-C

## 2022-08-29 ENCOUNTER — Other Ambulatory Visit: Payer: Self-pay | Admitting: Physician Assistant

## 2022-08-29 DIAGNOSIS — I700 Atherosclerosis of aorta: Secondary | ICD-10-CM

## 2022-08-29 DIAGNOSIS — R748 Abnormal levels of other serum enzymes: Secondary | ICD-10-CM

## 2022-08-29 MED ORDER — PITAVASTATIN CALCIUM 4 MG PO TABS: 1.0000 | ORAL_TABLET | Freq: Every day | ORAL | 3 refills | Status: DC

## 2022-08-29 NOTE — Progress Notes (Signed)
Elevated liver enzymes. Need to add hepatitis panel and ordered ultrasound of abdomen.  Cholesterol not to goal. Increased statin to '4mg'$  daily.

## 2022-08-31 DIAGNOSIS — M2550 Pain in unspecified joint: Secondary | ICD-10-CM | POA: Insufficient documentation

## 2022-09-01 LAB — CBC WITH DIFFERENTIAL/PLATELET
Absolute Monocytes: 705 cells/uL (ref 200–950)
Basophils Absolute: 49 cells/uL (ref 0–200)
Basophils Relative: 0.6 %
Eosinophils Absolute: 97 cells/uL (ref 15–500)
Eosinophils Relative: 1.2 %
HCT: 43.3 % (ref 35.0–45.0)
Hemoglobin: 14.9 g/dL (ref 11.7–15.5)
Lymphs Abs: 2770 cells/uL (ref 850–3900)
MCH: 33.6 pg — ABNORMAL HIGH (ref 27.0–33.0)
MCHC: 34.4 g/dL (ref 32.0–36.0)
MCV: 97.5 fL (ref 80.0–100.0)
MPV: 9.6 fL (ref 7.5–12.5)
Monocytes Relative: 8.7 %
Neutro Abs: 4479 cells/uL (ref 1500–7800)
Neutrophils Relative %: 55.3 %
Platelets: 318 10*3/uL (ref 140–400)
RBC: 4.44 10*6/uL (ref 3.80–5.10)
RDW: 12 % (ref 11.0–15.0)
Total Lymphocyte: 34.2 %
WBC: 8.1 10*3/uL (ref 3.8–10.8)

## 2022-09-01 LAB — COMPLETE METABOLIC PANEL WITH GFR
AG Ratio: 1.5 (calc) (ref 1.0–2.5)
ALT: 162 U/L — ABNORMAL HIGH (ref 6–29)
AST: 114 U/L — ABNORMAL HIGH (ref 10–35)
Albumin: 4.4 g/dL (ref 3.6–5.1)
Alkaline phosphatase (APISO): 67 U/L (ref 37–153)
BUN: 17 mg/dL (ref 7–25)
CO2: 28 mmol/L (ref 20–32)
Calcium: 10.2 mg/dL (ref 8.6–10.4)
Chloride: 102 mmol/L (ref 98–110)
Creat: 1 mg/dL (ref 0.50–1.05)
Globulin: 3 g/dL (calc) (ref 1.9–3.7)
Glucose, Bld: 106 mg/dL — ABNORMAL HIGH (ref 65–99)
Potassium: 4.6 mmol/L (ref 3.5–5.3)
Sodium: 141 mmol/L (ref 135–146)
Total Bilirubin: 0.5 mg/dL (ref 0.2–1.2)
Total Protein: 7.4 g/dL (ref 6.1–8.1)
eGFR: 61 mL/min/{1.73_m2} (ref >=60)

## 2022-09-01 LAB — ADD ON HEP FUNCT PNL
AG Ratio: 1.5 (calc) (ref 1.0–2.5)
ALT: 162 U/L — ABNORMAL HIGH (ref 6–29)
AST: 114 U/L — ABNORMAL HIGH (ref 10–35)
Albumin: 4.4 g/dL (ref 3.6–5.1)
Alkaline phosphatase (APISO): 67 U/L (ref 37–153)
Bilirubin, Direct: 0.1 mg/dL (ref 0.0–0.2)
Globulin: 3 g/dL (calc) (ref 1.9–3.7)
Indirect Bilirubin: 0.4 mg/dL (calc) (ref 0.2–1.2)
Total Bilirubin: 0.5 mg/dL (ref 0.2–1.2)
Total Protein: 7.4 g/dL (ref 6.1–8.1)

## 2022-09-01 LAB — LIPID PANEL W/REFLEX DIRECT LDL
Cholesterol: 216 mg/dL — ABNORMAL HIGH (ref <200)
HDL: 56 mg/dL (ref >=50)
LDL Cholesterol (Calc): 126 mg/dL (calc) — ABNORMAL HIGH
Non-HDL Cholesterol (Calc): 160 mg/dL (calc) — ABNORMAL HIGH (ref <130)
Total CHOL/HDL Ratio: 3.9 (calc) (ref <5.0)
Triglycerides: 205 mg/dL — ABNORMAL HIGH (ref <150)

## 2022-09-01 LAB — TSH: TSH: 3.17 mIU/L (ref 0.40–4.50)

## 2022-09-03 NOTE — Progress Notes (Signed)
Looking for hepatitis panel?

## 2022-09-05 ENCOUNTER — Encounter: Payer: Self-pay | Admitting: Physician Assistant

## 2022-09-05 ENCOUNTER — Ambulatory Visit (INDEPENDENT_AMBULATORY_CARE_PROVIDER_SITE_OTHER): Payer: Medicare Other

## 2022-09-05 ENCOUNTER — Other Ambulatory Visit: Payer: Self-pay | Admitting: Physician Assistant

## 2022-09-05 DIAGNOSIS — R748 Abnormal levels of other serum enzymes: Secondary | ICD-10-CM

## 2022-09-05 DIAGNOSIS — R935 Abnormal findings on diagnostic imaging of other abdominal regions, including retroperitoneum: Secondary | ICD-10-CM

## 2022-09-05 NOTE — Progress Notes (Signed)
Fatty liver with questionable small fluid collection above the pancreas. Need to get CT to better evaluate. It was ordered.

## 2022-09-17 ENCOUNTER — Encounter: Payer: Self-pay | Admitting: Family Medicine

## 2022-09-17 ENCOUNTER — Ambulatory Visit (INDEPENDENT_AMBULATORY_CARE_PROVIDER_SITE_OTHER): Payer: Medicare Other

## 2022-09-17 DIAGNOSIS — R935 Abnormal findings on diagnostic imaging of other abdominal regions, including retroperitoneum: Secondary | ICD-10-CM | POA: Diagnosis not present

## 2022-09-17 DIAGNOSIS — I517 Cardiomegaly: Secondary | ICD-10-CM | POA: Insufficient documentation

## 2022-09-17 DIAGNOSIS — R7989 Other specified abnormal findings of blood chemistry: Secondary | ICD-10-CM | POA: Diagnosis not present

## 2022-09-17 MED ORDER — IOHEXOL 300 MG/ML  SOLN
100.0000 mL | Freq: Once | INTRAMUSCULAR | Status: AC | PRN
  Administered 2022-09-17: 100 mL via INTRAVENOUS

## 2022-09-17 NOTE — Progress Notes (Signed)
Hi Marissa Willis, I am covering for [facility] while she is out of the office.  In the CT of your abdomen they did get a picture of the lower part of your lungs.  They did note what looks like emphysema.  Has some scar tissue in your right lower lobe of your lung.  It looks like you already have a known diagnosis of COPD.  You do have significant fat content within the liver which is consistent with a diagnosis called hepatic steatosis.  This is also causing your liver to be a little bit larger than it should.  The treatment for this is to really work on healthy diet regular exercise and decreasing your BMI.  This typically helps reduce the fat content in the liver and reduce inflammation of the liver.  This is concerning for possible mild cirrhosis of the liver as well.  Did not see any fluid around the pancreas which is great.  They did see some plaque deposition in the aorta which is the big blood vessel that comes off of the heart.  It is not new.  In fact it was noted in your chart back in 2016.  Also noted a bulging disc at L3-4 and L5-S1 in your low back.  In regards to the liver I would like to refer you to a specialist which is usually a gastroenterologist.  If you have a gastroenterologist that you already see then please let us know.  Also need to make sure that your hepatitis A and hepatitis B vaccines are up-to-date.  I also like to get some extra blood work as well if you are okay with that.

## 2022-09-28 ENCOUNTER — Telehealth: Payer: Self-pay | Admitting: Physician Assistant

## 2022-09-28 NOTE — Telephone Encounter (Signed)
Called patient to schedule Medicare Annual Wellness Visit (AWV). Left message for patient to call back and schedule Medicare Annual Wellness Visit (AWV).  Last date of AWV: Never  Please schedule an appointment at any time with [facility].  If any questions, please contact me at [phone].  Thank you ,  Morgan Jessup Patient Access Advocate II Direct Dial: [phone]  

## 2022-11-05 ENCOUNTER — Other Ambulatory Visit: Payer: Self-pay | Admitting: Physician Assistant

## 2022-11-05 ENCOUNTER — Other Ambulatory Visit (HOSPITAL_BASED_OUTPATIENT_CLINIC_OR_DEPARTMENT_OTHER): Payer: Self-pay

## 2022-11-07 ENCOUNTER — Other Ambulatory Visit (HOSPITAL_BASED_OUTPATIENT_CLINIC_OR_DEPARTMENT_OTHER): Payer: Self-pay

## 2022-11-28 ENCOUNTER — Ambulatory Visit (INDEPENDENT_AMBULATORY_CARE_PROVIDER_SITE_OTHER): Payer: Medicare Other | Admitting: Physician Assistant

## 2022-11-28 ENCOUNTER — Encounter: Payer: Self-pay | Admitting: Physician Assistant

## 2022-11-28 VITALS — BP 149/71 | HR 81 | Ht 62.0 in | Wt 208.0 lb

## 2022-11-28 DIAGNOSIS — M7062 Trochanteric bursitis, left hip: Secondary | ICD-10-CM

## 2022-11-28 DIAGNOSIS — R918 Other nonspecific abnormal finding of lung field: Secondary | ICD-10-CM | POA: Diagnosis not present

## 2022-11-28 DIAGNOSIS — M7061 Trochanteric bursitis, right hip: Secondary | ICD-10-CM

## 2022-11-28 DIAGNOSIS — R7301 Impaired fasting glucose: Secondary | ICD-10-CM | POA: Diagnosis not present

## 2022-11-28 DIAGNOSIS — J449 Chronic obstructive pulmonary disease, unspecified: Secondary | ICD-10-CM | POA: Diagnosis not present

## 2022-11-28 DIAGNOSIS — I700 Atherosclerosis of aorta: Secondary | ICD-10-CM

## 2022-11-28 DIAGNOSIS — Z13820 Encounter for screening for osteoporosis: Secondary | ICD-10-CM

## 2022-11-28 DIAGNOSIS — K760 Fatty (change of) liver, not elsewhere classified: Secondary | ICD-10-CM

## 2022-11-28 DIAGNOSIS — Z1329 Encounter for screening for other suspected endocrine disorder: Secondary | ICD-10-CM

## 2022-11-28 DIAGNOSIS — M8580 Other specified disorders of bone density and structure, unspecified site: Secondary | ICD-10-CM

## 2022-11-28 DIAGNOSIS — R748 Abnormal levels of other serum enzymes: Secondary | ICD-10-CM

## 2022-11-28 MED ORDER — IPRATROPIUM-ALBUTEROL 0.5-2.5 (3) MG/3ML IN SOLN: 3.0000 mL | Freq: Four times a day (QID) | RESPIRATORY_TRACT | 3 refills | Status: DC | PRN

## 2022-11-28 MED ORDER — MELOXICAM 15 MG PO TABS: 15.0000 mg | ORAL_TABLET | Freq: Every day | ORAL | 1 refills | Status: DC

## 2022-11-28 MED ORDER — BUDESONIDE 0.25 MG/2ML IN SUSP: 0.2500 mg | Freq: Two times a day (BID) | RESPIRATORY_TRACT | 12 refills | Status: DC

## 2022-11-28 NOTE — Progress Notes (Signed)
Established Patient Office Visit  Subjective   Patient ID: Marissa Willis, female    DOB: 06/18/1956  Age: 67 y.o. MRN: [id number]  Chief Complaint  Patient presents with   Follow-up    HPI Pt is a 67 yo obese female with history of hypertension, cardiomegaly, gross sclerosis, COPD stage III, asthma, hepatic steatosis, hyperlipidemia who presents to the clinic for follow up and medication refills.   Patient has known COPD/emphysema and cannot afford the Anoro.  She has used some nebulizer form of budesonide which seems to help.  Right now she is just using as needed.  She is struggling to afford her medications because she has no prescription coverage.  She continues to feel easily winded.  She is not smoking.  She did have a CT of the chest done in April.  She would like to go over the results.  She is concerned about her last CT scan that showed hepatic steatosis and possible cirrhosis.  She has not followed up with gastroenterology.  She is not taking any Tylenol but she admits to drinking approximately 2 glasses of wine a night.  She feels like she could stop this but does not want to.  She denies any chest pain, palpitations, headaches or vision changes.  She has started the increased dose of statin and wants to make sure her cholesterol is improving.   .. Active Ambulatory Problems    Diagnosis Date Noted   Stage 3 severe COPD by GOLD classification (HCC) 12/26/2012   Asthma 12/26/2012   Complex partial seizure (HCC) 03/02/2013   Former smoker 12/09/2013   Mixed hyperlipidemia 12/09/2013   Aortic atherosclerosis (HCC) 12/28/2014   Bilateral lower extremity edema 07/06/2015   Renal calculi 12/09/2015   Kidney calculi 01/12/2016   Finger fracture, right 08/24/2016   Bilateral hip pain 09/11/2017   Epigastric pain 09/11/2017   RUQ pain 09/11/2017   Class 2 severe obesity due to excess calories with serious comorbidity and body mass index (BMI) of 35.0 to 35.9 in adult  (HCC) 09/11/2017   Hepatic steatosis 09/16/2017   Cyst near tailbone 02/17/2018   Generalized intra-abdominal and pelvic swelling, mass and lump 02/17/2018   Polyp of sigmoid colon 02/17/2018   Internal hemorrhoids 02/17/2018   Diverticulosis 03/04/2018   Benign essential hypertension 03/20/2019   Adenomatous polyp of colon 09/08/2019   Low serum vitamin B12 09/09/2019   Elevated fasting glucose 09/09/2019   SOB (shortness of breath) on exertion 09/09/2019   Memory changes 09/09/2019   Fatigue 09/09/2019   Age related osteoporosis 09/09/2019   Post-menopausal 09/09/2019   Osteopenia 09/21/2019   Left ventricular hypertrophy 09/22/2019   Claudication of both lower extremities (HCC) 08/22/2020   Dysfunction of both eustachian tubes 04/26/2021   Nasal congestion 04/26/2021   Serum calcium elevated 08/14/2021   Elevated liver enzymes 08/14/2021   Chronic bilateral low back pain without sciatica 08/14/2021   Osteoporosis screening 08/14/2021   Pulmonary nodules 11/22/2021   Macular degeneration of both eyes 11/22/2021   Arthralgia 08/31/2022   Cardiomegaly 09/17/2022   Resolved Ambulatory Problems    Diagnosis Date Noted   Hand injury, right, initial encounter 08/10/2016   Hyperlipidemia associated with type 2 diabetes mellitus (HCC) 08/22/2020   Past Medical History:  Diagnosis Date   Asthma with COPD    Complex partial seizure disorder [facility]) neurologist-  dr Terrace Arabia   History of cerebral aneurysm repair    History of kidney stones    History of partial seizures  Hyperlipidemia    OSA on CPAP    Retained ureteral stent    SUI (stress urinary incontinence, female)      ROS See HPI.    Objective:     BP (!) 149/71   Pulse 81   Ht 5\' 2"  (1.575 m)   Wt 208 lb (94.3 kg)   SpO2 99%   BMI 38.04 kg/m  BP Readings from Last 3 Encounters:  11/28/22 (!) 149/71  08/28/22 138/72  05/08/22 (!) 149/63   Wt Readings from Last 3 Encounters:  11/28/22 208 lb (94.3 kg)   08/28/22 210 lb (95.3 kg)  05/08/22 200 lb (90.7 kg)    ..    08/28/2022    9:04 AM 09/11/2017    2:35 PM  Depression screen PHQ 2/9  Decreased Interest 0 1  Down, Depressed, Hopeless 0 1  PHQ - 2 Score 0 2  Altered sleeping 0 0  Tired, decreased energy 0 0  Change in appetite 0 1  Feeling bad or failure about yourself  1 1  Trouble concentrating 0 0  Moving slowly or fidgety/restless 0 0  Suicidal thoughts 0 0  PHQ-9 Score 1 4  Difficult doing work/chores Not difficult at all Not difficult at all   ..    08/28/2022    9:05 AM 09/11/2017    2:35 PM  GAD 7 : Generalized Anxiety Score  Nervous, Anxious, on Edge 0 0  Control/stop worrying 0 1  Worry too much - different things 0 1  Trouble relaxing 0 0  Restless 0 0  Easily annoyed or irritable 0 0  Afraid - awful might happen 0 0  Total GAD 7 Score 0 2  Anxiety Difficulty Not difficult at all Not difficult at all      Physical Exam Constitutional:      Appearance: Normal appearance. She is obese.  HENT:     Head: Normocephalic.  Cardiovascular:     Rate and Rhythm: Normal rate and regular rhythm.  Pulmonary:     Effort: Pulmonary effort is normal.     Breath sounds: Normal breath sounds.  Musculoskeletal:     Right lower leg: No edema.     Left lower leg: No edema.  Neurological:     General: No focal deficit present.     Mental Status: She is alert and oriented to person, place, and time.  Psychiatric:        Mood and Affect: Mood normal.      The 10-year ASCVD risk score (Arnett DK, et al., 2019) is: 42.2%    Assessment & Plan:  [name] KitchenMarland KitchenAngelita was seen today for follow-up.  Diagnoses and all orders for this visit:  Stage 3 severe COPD by GOLD classification (HCC) -     budesonide (PULMICORT) 0.25 MG/2ML nebulizer solution; Take 2 mLs (0.25 mg total) by nebulization 2 (two) times daily. -     ipratropium-albuterol (DUONEB) 0.5-2.5 (3) MG/3ML SOLN; Take 3 mLs by nebulization every 6 (six) hours as  needed. -     COMPLETE METABOLIC PANEL WITH GFR  Aortic atherosclerosis (HCC) -     Lipid Panel w/reflex Direct LDL  Pulmonary nodules  Elevated fasting glucose -     Hemoglobin A1c  Serum calcium elevated -     PTH, Intact and Calcium  Thyroid disorder screen -     TSH  Osteopenia, unspecified location -     DG Bone Density; Future  Osteoporosis screening -  DG Bone Density; Future  Trochanteric bursitis of both hips -     meloxicam (MOBIC) 15 MG tablet; Take 1 tablet (15 mg total) by mouth daily.  Elevated liver enzymes -     Ambulatory referral to Gastroenterology  Hepatic steatosis -     Ambulatory referral to Gastroenterology   Reviewed CT scan of lungs and abdomen Pulmonary nodule to follow up in 08/2023 Hepatic steatosis with signs of cirrhosis-referral made to GI STOP all tylenol and alcohol Recheck liver enzymes today  Discussed bursitis Declined imaging of hips Declined injection of bursa HO given with exercises Mobic given to take daily Recheck CMP  Needs bone density with osteopenia and hx of compression fractures.  Ordered placed today.   Fasting labs ordered today  COPD management Budesonide and duoneb sent in nebulizer form to be more affordable  Recheck lipid to see if medication helping  Make sure taking ASA 81mg  daily Vitamin D 2000 units daily  Needs advantage plan-look into health team advantage    Spent 45 minutes with patient, in chart review, discussing plan, and coordinating care.   Return in about 3 months (around 02/28/2023).    Tandy Gaw, PA-C

## 2022-11-28 NOTE — Patient Instructions (Addendum)
2000 units daily of vitamin D calcium 1200mg  daily.  Start ASA 81mg  daily.  Bone density Schedule with gastroenterology to talk about liver  Hip Bursitis Rehab Ask your health care provider which exercises are safe for you. Do exercises exactly as told by your health care provider and adjust them as directed. It is normal to feel mild stretching, pulling, tightness, or discomfort as you do these exercises. Stop right away if you feel sudden pain or your pain gets worse. Do not begin these exercises until told by your health care provider. Stretching exercise This exercise warms up your muscles and joints and improves the movement and flexibility of your hip. This exercise also helps to relieve pain and stiffness. Iliotibial band stretch An iliotibial band is a strong band of muscle tissue that runs from the outer side of your hip to the outer side of your thigh and knee. Lie on your side with your left / right leg in the top position. Bend your left / right knee and grab your ankle. Stretch out your bottom arm to help you balance. Slowly bring your knee back so your thigh is slightly behind your body. Slowly lower your knee toward the floor until you feel a gentle stretch on the outside of your left / right thigh. If you do not feel a stretch and your knee will not lower more toward the floor, place the heel of your other foot on top of your knee and pull your knee down toward the floor with your foot. Hold this position for __________ seconds. Slowly return to the starting position. Repeat __________ times. Complete this exercise __________ times a day. Strengthening exercises These exercises build strength and endurance in your hip and pelvis. Endurance is the ability to use your muscles for a long time, even after they get tired. Bridge This exercise strengthens the muscles that move your thigh backward (hip extensors). Lie on your back on a firm surface with your knees bent and your feet  flat on the floor. Tighten your buttocks muscles and lift your buttocks off the floor until your trunk is level with your thighs. Do not arch your back. You should feel the muscles working in your buttocks and the back of your thighs. If you do not feel these muscles, slide your feet 1-2 inches (2.5-5 cm) farther away from your buttocks. If this exercise is too easy, try doing it with your arms crossed over your chest. Hold this position for __________ seconds. Slowly lower your hips to the starting position. Let your muscles relax completely after each repetition. Repeat __________ times. Complete this exercise __________ times a day. Squats This exercise strengthens the muscles in front of your thigh and knee (quadriceps). Stand in front of a table, with your feet and knees pointing straight ahead. You may rest your hands on the table for balance but not for support. Slowly bend your knees and lower your hips like you are going to sit in a chair. Keep your weight over your heels, not over your toes. Keep your lower legs upright so they are parallel with the table legs. Do not let your hips go lower than your knees. Do not bend lower than told by your health care provider. If your hip pain increases, do not bend as low. Hold the squat position for __________ seconds. Slowly push with your legs to return to standing. Do not use your hands to pull yourself to standing. Repeat __________ times. Complete this exercise __________ times  a day. Hip hike  Stand sideways on a bottom step. Stand on your left / right leg with your other foot unsupported next to the step. You can hold on to the railing or wall for balance if needed. Keep your knees straight and your torso square. Then lift your left / right hip up toward the ceiling. Hold this position for __________ seconds. Slowly let your left / right hip lower toward the floor, past the starting position. Your foot should get closer to the floor.  Do not lean or bend your knees. Repeat __________ times. Complete this exercise __________ times a day. Single leg stand This exercise increases your balance. Without shoes, stand near a railing or in a doorway. You may hold on to the railing or door frame as needed for balance. Squeeze your left / right buttock muscles, then lift up your other foot. Do not let your left / right hip push out to the side. It is helpful to stand in front of a mirror for this exercise so you can watch your hip. Hold this position for __________ seconds. Repeat __________ times. Complete this exercise __________ times a day. This information is not intended to replace advice given to you by your health care provider. Make sure you discuss any questions you have with your health care provider. Document Revised: 05/17/2021 Document Reviewed: 05/17/2021 Elsevier Patient Education  2024 [employer]   Hip Bursitis  Hip bursitis is the swelling of one or more of the fluid-filled sacs (bursae) in the hip joint. The hip bursae absorb shocks and prevent bones from rubbing against each other. If a bursa becomes irritated, it can fill with extra fluid and become inflamed. Hip bursitis can cause mild to moderate pain, and symptoms often come and go over time. What are the causes? This condition results from increased friction between the hip bones and the tendons around the hip joint. This condition can happen if you: Overuse your hip muscles. Injure your hip. Have weak buttocks muscles. Have bone spurs. Have an infection. In some cases, the cause may not be known. What increases the risk? You are more likely to develop this condition if: You injured your hip previously or had hip surgery. You have a medical condition, such as arthritis, gout, diabetes, or thyroid disease. You have spine problems. You have one leg that is shorter than the other. You participate in athletic activities that include repetitive  motion, like running. You participate in sports where there is a risk of injury or falling, such as football, martial arts, or skiing. What are the signs or symptoms? Symptoms may come and go, and they often include: Pain in the hip or groin area. Pain may get worse with movement. Tenderness and swelling of the hip. In rare cases, the bursa may become infected. If this happens, you may get a fever, as well as warmth and redness in the hip area. How is this diagnosed? This condition may be diagnosed based on: Your symptoms. Your medical history. A physical exam. Imaging tests, such as: X-rays to check your bones. MRI or ultrasound to check your tendons and muscles. Bone scan. How is this treated? This condition is treated by resting, icing, applying pressure (compression), and raising (elevating) the injured area. This is called RICE treatment. In some cases, RICE treatment may not be enough to make your symptoms go away. Treatment may also include: Using crutches, a cane, or a walker to decrease the strain on your hip. Taking medicine  to help with swelling and pain. Getting a shot of cortisone medicine near the affected area to reduce swelling and pain. Taking antibiotic medicines if there is an infection. Draining fluid out of the bursa to help relieve swelling and pain. Having surgery to remove a damaged or infected bursa. This is rare. Long-term treatment may include: Physical therapy exercises for strength and flexibility. Identifying the cause of your bursitis to prevent future episodes. Lifestyle changes, such as weight loss, to reduce the strain on the hip. Follow these instructions at home: Managing pain, stiffness, and swelling     If directed, put ice on the affected area. To do this: Put ice in a plastic bag. Place a towel between your skin and the bag. Leave the ice on for 20 minutes, 2-3 times a day. Remove the ice if your skin turns bright red. This is very  important. If you cannot feel pain, heat, or cold, you have a greater risk of damage to the area. Elevate your hip as much as you can without feeling pain. To do this, put a pillow under your hips while you lie down. If directed, apply heat to the affected area as often as told by your health care provider. Use the heat source that your health care provider recommends, such as a moist heat pack or a heating pad. Place a towel between your skin and the heat source. Leave the heat on for 20-30 minutes. Remove the heat if your skin turns bright red. This is especially important if you are unable to feel pain, heat, or cold. You may have a greater risk of getting burned. Activity Do not use your hip to support your body weight until your health care provider says that you can. Use crutches, a cane, or a walker as told by your health care provider. If the affected leg is one that you use to drive, ask your health care provider if it is safe to drive. Rest and protect your hip as much as possible until your pain and swelling get better. Return to your normal activities as told by your health care provider. Ask your health care provider what activities are safe for you. Do exercises as told by your health care provider. General instructions Take over-the-counter and prescription medicines only as told by your health care provider. Gently massage and stretch your injured area as often as is comfortable. Wear compression wraps only as told by your health care provider. If one of your legs is shorter than the other, get fitted for a shoe insert or orthotic. Your health care provider or physical therapist can tell you where to find these items and what size you need. Maintain a healthy weight. Follow instructions from your health care provider for weight control. These may include dietary restrictions. Keep all follow-up visits. This is important. How is this prevented? Exercise regularly or as told by your  health care provider. Wear supportive footwear that is appropriate for your sport and daily activities. Warm up and stretch before being active. Cool down and stretch after being active. Take breaks regularly from repetitive activity. If an activity irritates your hip or causes pain, avoid the activity as much as possible. Avoid sitting down for long periods at a time. Where to find more information American [employer]: orthoinfo.aaos.org Contact a health care provider if: You have a fever. You develop new symptoms. You have trouble walking or doing everyday activities. You have pain that gets worse or does not  get better with medicine. You develop red skin or a feeling of warmth in your hip area. Get help right away if: You cannot move your hip. You have severe pain. You cannot control the muscles in your feet. Summary Hip bursitis is the swelling of one or more of the fluid-filled sacs (bursae) in the hip joint. Hip bursitis can cause hip or groin pain, and symptoms often come and go over time. This condition is often treated by resting, icing, applying pressure (compression), and raising (elevating) the injured area. Other treatments may be needed. This information is not intended to replace advice given to you by your health care provider. Make sure you discuss any questions you have with your health care provider. Document Revised: 05/30/2021 Document Reviewed: 05/30/2021 Elsevier Patient Education  2024 [employer].

## 2022-11-29 LAB — LIPID PANEL W/REFLEX DIRECT LDL
Cholesterol: 191 mg/dL (ref <200)
HDL: 53 mg/dL (ref >=50)
LDL Cholesterol (Calc): 101 mg/dL (calc) — ABNORMAL HIGH
Non-HDL Cholesterol (Calc): 138 mg/dL (calc) — ABNORMAL HIGH (ref <130)
Total CHOL/HDL Ratio: 3.6 (calc) (ref <5.0)
Triglycerides: 241 mg/dL — ABNORMAL HIGH (ref <150)

## 2022-11-29 LAB — HEMOGLOBIN A1C
Hgb A1c MFr Bld: 5.8 % of total Hgb — ABNORMAL HIGH (ref <5.7)
Mean Plasma Glucose: 120 mg/dL
eAG (mmol/L): 6.6 mmol/L

## 2022-11-29 LAB — COMPLETE METABOLIC PANEL WITH GFR
AG Ratio: 1.5 (calc) (ref 1.0–2.5)
ALT: 91 U/L — ABNORMAL HIGH (ref 6–29)
AST: 74 U/L — ABNORMAL HIGH (ref 10–35)
Albumin: 4.3 g/dL (ref 3.6–5.1)
Alkaline phosphatase (APISO): 83 U/L (ref 37–153)
BUN: 15 mg/dL (ref 7–25)
CO2: 29 mmol/L (ref 20–32)
Calcium: 10 mg/dL (ref 8.6–10.4)
Chloride: 102 mmol/L (ref 98–110)
Creat: 0.97 mg/dL (ref 0.50–1.05)
Globulin: 2.8 g/dL (calc) (ref 1.9–3.7)
Glucose, Bld: 108 mg/dL — ABNORMAL HIGH (ref 65–99)
Potassium: 4.9 mmol/L (ref 3.5–5.3)
Sodium: 139 mmol/L (ref 135–146)
Total Bilirubin: 0.4 mg/dL (ref 0.2–1.2)
Total Protein: 7.1 g/dL (ref 6.1–8.1)
eGFR: 63 mL/min/{1.73_m2} (ref >=60)

## 2022-11-29 LAB — PTH, INTACT AND CALCIUM
Calcium: 10 mg/dL (ref 8.6–10.4)
PTH: 36 pg/mL (ref 16–77)

## 2022-11-29 LAB — TSH: TSH: 2.71 mIU/L (ref 0.40–4.50)

## 2022-11-30 NOTE — Progress Notes (Signed)
Debbie,   Liver enzymes are much better. I think if you stop alcohol they would continue to improve.  A1C in pre-diabetes range. Watch sugars and carbs in diet and try to avoid them. Try to start walking 30 minutes a day.  Thyroid looks good.  Cholesterol much better.  HDL still looks good and LDL at 100.

## 2023-03-01 ENCOUNTER — Ambulatory Visit: Payer: Medicare Other | Admitting: Physician Assistant

## 2023-09-23 ENCOUNTER — Ambulatory Visit (INDEPENDENT_AMBULATORY_CARE_PROVIDER_SITE_OTHER)

## 2023-09-23 ENCOUNTER — Ambulatory Visit (INDEPENDENT_AMBULATORY_CARE_PROVIDER_SITE_OTHER): Admitting: Physician Assistant

## 2023-09-23 ENCOUNTER — Encounter: Payer: Self-pay | Admitting: Physician Assistant

## 2023-09-23 VITALS — BP 138/60 | HR 74 | Ht 62.0 in | Wt 227.0 lb

## 2023-09-23 DIAGNOSIS — I700 Atherosclerosis of aorta: Secondary | ICD-10-CM

## 2023-09-23 DIAGNOSIS — R0602 Shortness of breath: Secondary | ICD-10-CM

## 2023-09-23 DIAGNOSIS — M8580 Other specified disorders of bone density and structure, unspecified site: Secondary | ICD-10-CM

## 2023-09-23 DIAGNOSIS — R5383 Other fatigue: Secondary | ICD-10-CM

## 2023-09-23 DIAGNOSIS — R600 Localized edema: Secondary | ICD-10-CM

## 2023-09-23 DIAGNOSIS — I10 Essential (primary) hypertension: Secondary | ICD-10-CM | POA: Diagnosis not present

## 2023-09-23 DIAGNOSIS — J449 Chronic obstructive pulmonary disease, unspecified: Secondary | ICD-10-CM

## 2023-09-23 DIAGNOSIS — E559 Vitamin D deficiency, unspecified: Secondary | ICD-10-CM | POA: Insufficient documentation

## 2023-09-23 DIAGNOSIS — E66813 Obesity, class 3: Secondary | ICD-10-CM

## 2023-09-23 DIAGNOSIS — R7303 Prediabetes: Secondary | ICD-10-CM | POA: Diagnosis not present

## 2023-09-23 DIAGNOSIS — R011 Cardiac murmur, unspecified: Secondary | ICD-10-CM

## 2023-09-23 DIAGNOSIS — K760 Fatty (change of) liver, not elsewhere classified: Secondary | ICD-10-CM

## 2023-09-23 DIAGNOSIS — E782 Mixed hyperlipidemia: Secondary | ICD-10-CM

## 2023-09-23 HISTORY — DX: Vitamin D deficiency, unspecified: E55.9

## 2023-09-23 LAB — POCT UA - MICROALBUMIN
Creatinine, POC: 10 mg/dL
Microalbumin Ur, POC: 10 mg/L

## 2023-09-23 MED ORDER — PROAIR RESPICLICK 108 (90 BASE) MCG/ACT IN AEPB: 2.0000 | INHALATION_SPRAY | Freq: Four times a day (QID) | RESPIRATORY_TRACT | 2 refills | Status: AC | PRN

## 2023-09-23 MED ORDER — LISINOPRIL-HYDROCHLOROTHIAZIDE 20-25 MG PO TABS: 1.0000 | ORAL_TABLET | Freq: Every day | ORAL | 1 refills | Status: DC

## 2023-09-23 MED ORDER — BUDESONIDE 0.25 MG/2ML IN SUSP: 0.2500 mg | Freq: Two times a day (BID) | RESPIRATORY_TRACT | 12 refills | Status: DC

## 2023-09-23 NOTE — Progress Notes (Signed)
 No acute findings. You do have evidence of plaque accumulation around heart. I would strongly suggest restarting the cholesterol medication and take ASA 81mg  a day.

## 2023-09-23 NOTE — Progress Notes (Unsigned)
 Established Patient Office Visit  Subjective   Patient ID: Marissa Willis, female    DOB: [birth year]  Age: 68 y.o. MRN: [id number]  Chief Complaint  Patient presents with   Medical Management of Chronic Issues    medication renewals    HPI Pt is a 68 yo obese female with HTN, chronic allergic rhinitis, COPD, HLD who presents to the clinic for medication follow up.   Pt has felt like she has been more short of breath and having issues with swelling in lower legs for 6 months. She has not done anything about it. She is having trouble doing things that she used to have no trouble doing like walking in stores and grocery shopping. She is much more fatigued with anything she does. She does have productive cough. Her father did have CHF. She is having some orthopnea.    .. Active Ambulatory Problems    Diagnosis Date Noted   Stage 3 severe COPD by GOLD classification (HCC) 12/26/2012   Asthma 12/26/2012   Complex partial seizure (HCC) 03/02/2013   Former smoker 12/09/2013   Mixed hyperlipidemia 12/09/2013   Aortic atherosclerosis (HCC) 12/28/2014   Bilateral lower extremity edema 07/06/2015   Renal calculi 12/09/2015   Kidney calculi 01/12/2016   Finger fracture, right 08/24/2016   Bilateral hip pain 09/11/2017   Epigastric pain 09/11/2017   RUQ pain 09/11/2017   Class 2 severe obesity due to excess calories with serious comorbidity and body mass index (BMI) of 35.0 to 35.9 in adult (HCC) 09/11/2017   Hepatic steatosis 09/16/2017   Cyst near tailbone 02/17/2018   Generalized intra-abdominal and pelvic swelling, mass and lump 02/17/2018   Polyp of sigmoid colon 02/17/2018   Internal hemorrhoids 02/17/2018   Diverticulosis 03/04/2018   Benign essential hypertension 03/20/2019   Adenomatous polyp of colon 09/08/2019   Low serum vitamin B12 09/09/2019   Elevated fasting glucose 09/09/2019   SOB (shortness of breath) on exertion 09/09/2019   Memory changes 09/09/2019    Fatigue 09/09/2019   Age related osteoporosis 09/09/2019   Post-menopausal 09/09/2019   Osteopenia 09/21/2019   Left ventricular hypertrophy 09/22/2019   Claudication of both lower extremities (HCC) 08/22/2020   Dysfunction of both eustachian tubes 04/26/2021   Nasal congestion 04/26/2021   Serum calcium elevated 08/14/2021   Elevated liver enzymes 08/14/2021   Chronic bilateral low back pain without sciatica 08/14/2021   Osteoporosis screening 08/14/2021   Pulmonary nodules 11/22/2021   Macular degeneration of both eyes 11/22/2021   Arthralgia 08/31/2022   Cardiomegaly 09/17/2022   Vitamin D insufficiency 09/23/2023   No energy 09/24/2023   Pre-diabetes 09/24/2023   Class 3 severe obesity due to excess calories with serious comorbidity and body mass index (BMI) of 40.0 to 44.9 in adult [facility]) 09/24/2023   Newly recognized murmur 09/24/2023   Resolved Ambulatory Problems    Diagnosis Date Noted   Hand injury, right, initial encounter 08/10/2016   Hyperlipidemia associated with type 2 diabetes mellitus (HCC) 08/22/2020   Past Medical History:  Diagnosis Date   Asthma with COPD (HCC)    Complex partial seizure disorder [facility]) neurologist-  dr Terrace Arabia   History of cerebral aneurysm repair    History of kidney stones    History of partial seizures    Hyperlipidemia    OSA on CPAP    Retained ureteral stent    SUI (stress urinary incontinence, female)      ROS See HPI.    Objective:  BP 138/60   Pulse 74   Ht 5\' 2"  (1.575 m)   Wt 227 lb (103 kg)   SpO2 99%   BMI 41.52 kg/m  BP Readings from Last 3 Encounters:  09/23/23 138/60  11/28/22 (!) 149/71  08/28/22 138/72   Wt Readings from Last 3 Encounters:  09/23/23 227 lb (103 kg)  11/28/22 208 lb (94.3 kg)  08/28/22 210 lb (95.3 kg)    .[name] Results for orders placed or performed in visit on 09/23/23  POCT UA - Microalbumin   Collection Time: 09/23/23 10:21 AM  Result Value Ref Range   Microalbumin Ur, POC 10  mg/L   Creatinine, POC 10 mg/dL   Albumin/Creatinine Ratio, Urine, POC 30-300      Physical Exam Constitutional:      Appearance: Normal appearance. She is obese.  HENT:     Head: Normocephalic.  Neck:     Vascular: No carotid bruit.  Cardiovascular:     Rate and Rhythm: Normal rate and regular rhythm.     Heart sounds: Murmur heard.  Pulmonary:     Effort: Pulmonary effort is normal.     Breath sounds: Normal breath sounds.  Musculoskeletal:     Right lower leg: Edema present.     Left lower leg: Edema present.     Comments: 1+ pitting edema bilateral ankles  Neurological:     General: No focal deficit present.     Mental Status: She is alert and oriented to person, place, and time.  Psychiatric:        Mood and Affect: Mood normal.       The 10-year ASCVD risk score (Arnett DK, et al., 2019) is: 14.4%    Assessment & Plan:  [name] KitchenMarland KitchenTinna "Eunice Blase" was seen today for medical management of chronic issues.  Diagnoses and all orders for this visit:  SOB (shortness of breath) on exertion -     CMP14+EGFR -     TSH -     DG Chest 2 View; Future -     Brain natriuretic peptide -     B12 and Folate Panel -     CBC w/Diff/Platelet -     Fe+TIBC+Fer -     ECHOCARDIOGRAM COMPLETE; Future -     Cardiac Stress Test: Informed Consent Details: Physician/Practitioner Attestation; Transcribe to consent form and obtain patient signature  Stage 3 severe COPD by GOLD classification (HCC) -     Albuterol Sulfate (PROAIR RESPICLICK) 108 (90 Base) MCG/ACT AEPB; Inhale 2 puffs into the lungs every 6 (six) hours as needed (shortness of breath/wheezing). -     budesonide (PULMICORT) 0.25 MG/2ML nebulizer solution; Take 2 mLs (0.25 mg total) by nebulization 2 (two) times daily.  Bilateral lower extremity edema -     CMP14+EGFR -     Brain natriuretic peptide -     ECHOCARDIOGRAM COMPLETE; Future -     Cardiac Stress Test: Informed Consent Details: Physician/Practitioner Attestation;  Transcribe to consent form and obtain patient signature  Aortic atherosclerosis (HCC) -     Lipid panel -     ZYPITAMAG 4 MG TABS; Take 1 tablet (4 mg total) by mouth daily.  Hepatic steatosis  No energy -     CMP14+EGFR -     TSH -     DG Chest 2 View; Future -     Brain natriuretic peptide -     B12 and Folate Panel -     CBC w/Diff/Platelet -  Fe+TIBC+Fer -     VITAMIN D 25 Hydroxy (Vit-D Deficiency, Fractures) -     ECHOCARDIOGRAM COMPLETE; Future  Benign essential hypertension -     lisinopril-hydrochlorothiazide (ZESTORETIC) 20-25 MG tablet; Take 1 tablet by mouth daily.  Mixed hyperlipidemia -     Lipid panel -     ZYPITAMAG 4 MG TABS; Take 1 tablet (4 mg total) by mouth daily.  Pre-diabetes -     Hemoglobin A1c -     POCT UA - Microalbumin  Osteopenia, unspecified location -     VITAMIN D 25 Hydroxy (Vit-D Deficiency, Fractures)  Vitamin D insufficiency -     VITAMIN D 25 Hydroxy (Vit-D Deficiency, Fractures)  Class 3 severe obesity due to excess calories with serious comorbidity and body mass index (BMI) of 40.0 to 44.9 in adult [facility])  Newly recognized murmur -     ECHOCARDIOGRAM COMPLETE; Future -     Cardiac Stress Test: Informed Consent Details: Physician/Practitioner Attestation; Transcribe to consent form and obtain patient signature   Concerned for heart failure BP not to goal and increasing diuretic could help with fluid overload Increased lisinopril/hydrochlorothiazide 20/25mg  Encouraged to wear compression stockings and keep feet elevated Will get labs for follow up on glucose, kidney, liver function Refilled zypitamag 4mg  daily, lipid ordered for recheck Will order stat CXR  Will order echo  Follow up in 4 weeks for BP and symptoms recheck     Tandy Gaw, PA-C

## 2023-09-23 NOTE — Patient Instructions (Addendum)
 Get chest xray today Get labs when fasting for 8 hours Will order echo Increased lisinopril hydrochlorothiazide dosage Recheck in 4 weeks  Peripheral Edema  Peripheral edema is swelling that is caused by a buildup of fluid. Peripheral edema most often affects the lower legs, ankles, and feet. It can also develop in the arms, hands, and face. The area of the body that has peripheral edema will look swollen. It may also feel heavy or warm. Your clothes may start to feel tight. Pressing on the area may make a temporary dent in your skin (pitting edema). You may not be able to move your swollen arm or leg as much as usual. There are many causes of peripheral edema. It can happen because of a complication of other conditions such as heart failure, kidney disease, or a problem with your circulation. It also can be a side effect of certain medicines or happen because of an infection. It often happens to women during pregnancy. Sometimes, the cause is not known. Follow these instructions at home: Managing pain, stiffness, and swelling  Raise (elevate) your legs while you are sitting or lying down. Move around often to prevent stiffness and to reduce swelling. Do not sit or stand for long periods of time. Do not wear tight clothing. Do not wear garters on your upper legs. Exercise your legs to get your circulation going. This helps to move the fluid back into your blood vessels, and it may help the swelling go down. Wear compression stockings as told by your health care provider. These stockings help to prevent blood clots and reduce swelling in your legs. It is important that these are the correct size. These stockings should be prescribed by your doctor to prevent possible injuries. If elastic bandages or wraps are recommended, use them as told by your health care provider. Medicines Take over-the-counter and prescription medicines only as told by your health care provider. Your health care provider  may prescribe medicine to help your body get rid of excess water (diuretic). Take this medicine if you are told to take it. General instructions Eat a low-salt (low-sodium) diet as told by your health care provider. Sometimes, eating less salt may reduce swelling. Pay attention to any changes in your symptoms. Moisturize your skin daily to help prevent skin from cracking and draining. Keep all follow-up visits. This is important. Contact a health care provider if: You have a fever. You have swelling in only one leg. You have increased swelling, redness, or pain in one or both of your legs. You have drainage or sores at the area where you have edema. Get help right away if: You have edema that starts suddenly or is getting worse, especially if you are pregnant or have a medical condition. You develop shortness of breath, especially when you are lying down. You have pain in your chest or abdomen. You feel weak. You feel like you will faint. These symptoms may be an emergency. Get help right away. Call 911. Do not wait to see if the symptoms will go away. Do not drive yourself to the hospital. Summary Peripheral edema is swelling that is caused by a buildup of fluid. Peripheral edema most often affects the lower legs, ankles, and feet. Move around often to prevent stiffness and to reduce swelling. Do not sit or stand for long periods of time. Pay attention to any changes in your symptoms. Contact a health care provider if you have edema that starts suddenly or is getting worse, especially  if you are pregnant or have a medical condition. Get help right away if you develop shortness of breath, especially when lying down. This information is not intended to replace advice given to you by your health care provider. Make sure you discuss any questions you have with your health care provider. Document Revised: 02/06/2021 Document Reviewed: 02/06/2021 Elsevier Patient Education  2024 [employer].

## 2023-09-24 ENCOUNTER — Encounter: Payer: Self-pay | Admitting: Physician Assistant

## 2023-09-24 DIAGNOSIS — R7303 Prediabetes: Secondary | ICD-10-CM

## 2023-09-24 DIAGNOSIS — R011 Cardiac murmur, unspecified: Secondary | ICD-10-CM | POA: Insufficient documentation

## 2023-09-24 DIAGNOSIS — E66813 Obesity, class 3: Secondary | ICD-10-CM | POA: Insufficient documentation

## 2023-09-24 DIAGNOSIS — R5383 Other fatigue: Secondary | ICD-10-CM | POA: Insufficient documentation

## 2023-09-24 HISTORY — DX: Prediabetes: R73.03

## 2023-09-24 MED ORDER — ZYPITAMAG 4 MG PO TABS: 1.0000 | ORAL_TABLET | Freq: Every day | ORAL | 3 refills | Status: AC

## 2023-09-25 ENCOUNTER — Encounter: Payer: Self-pay | Admitting: Physician Assistant

## 2023-09-25 LAB — CMP14+EGFR
ALT: 204 IU/L — ABNORMAL HIGH (ref 0–32)
AST: 154 IU/L — ABNORMAL HIGH (ref 0–40)
Albumin: 4.4 g/dL (ref 3.9–4.9)
Alkaline Phosphatase: 98 IU/L (ref 44–121)
BUN/Creatinine Ratio: 15 (ref 12–28)
BUN: 16 mg/dL (ref 8–27)
Bilirubin Total: 0.5 mg/dL (ref 0.0–1.2)
CO2: 23 mmol/L (ref 20–29)
Calcium: 10.3 mg/dL (ref 8.7–10.3)
Chloride: 100 mmol/L (ref 96–106)
Creatinine, Ser: 1.1 mg/dL — ABNORMAL HIGH (ref 0.57–1.00)
Globulin, Total: 2.9 g/dL (ref 1.5–4.5)
Glucose: 114 mg/dL — ABNORMAL HIGH (ref 70–99)
Potassium: 4.5 mmol/L (ref 3.5–5.2)
Sodium: 141 mmol/L (ref 134–144)
Total Protein: 7.3 g/dL (ref 6.0–8.5)
eGFR: 54 mL/min/{1.73_m2} — ABNORMAL LOW (ref >59)

## 2023-09-25 LAB — LIPID PANEL
Chol/HDL Ratio: 6.3 ratio — ABNORMAL HIGH (ref 0.0–4.4)
Cholesterol, Total: 270 mg/dL — ABNORMAL HIGH (ref 100–199)
HDL: 43 mg/dL (ref >39)
LDL Chol Calc (NIH): 188 mg/dL — ABNORMAL HIGH (ref 0–99)
Triglycerides: 206 mg/dL — ABNORMAL HIGH (ref 0–149)
VLDL Cholesterol Cal: 39 mg/dL (ref 5–40)

## 2023-09-25 LAB — IRON,TIBC AND FERRITIN PANEL
Ferritin: 528 ng/mL — ABNORMAL HIGH (ref 15–150)
Iron Saturation: 50 % (ref 15–55)
Iron: 185 ug/dL — ABNORMAL HIGH (ref 27–139)
Total Iron Binding Capacity: 372 ug/dL (ref 250–450)
UIBC: 187 ug/dL (ref 118–369)

## 2023-09-25 LAB — CBC WITH DIFFERENTIAL/PLATELET
Basophils Absolute: 0 10*3/uL (ref 0.0–0.2)
Basos: 1 %
EOS (ABSOLUTE): 0.2 10*3/uL (ref 0.0–0.4)
Eos: 3 %
Hematocrit: 41.3 % (ref 34.0–46.6)
Hemoglobin: 14.2 g/dL (ref 11.1–15.9)
Immature Grans (Abs): 0 10*3/uL (ref 0.0–0.1)
Immature Granulocytes: 0 %
Lymphocytes Absolute: 2.2 10*3/uL (ref 0.7–3.1)
Lymphs: 27 %
MCH: 34.9 pg — ABNORMAL HIGH (ref 26.6–33.0)
MCHC: 34.4 g/dL (ref 31.5–35.7)
MCV: 102 fL — ABNORMAL HIGH (ref 79–97)
Monocytes Absolute: 0.7 10*3/uL (ref 0.1–0.9)
Monocytes: 9 %
Neutrophils Absolute: 5 10*3/uL (ref 1.4–7.0)
Neutrophils: 60 %
Platelets: 236 10*3/uL (ref 150–450)
RBC: 4.07 x10E6/uL (ref 3.77–5.28)
RDW: 11.9 % (ref 11.7–15.4)
WBC: 8.2 10*3/uL (ref 3.4–10.8)

## 2023-09-25 LAB — BRAIN NATRIURETIC PEPTIDE: BNP: 21.3 pg/mL (ref 0.0–100.0)

## 2023-09-25 LAB — TSH: TSH: 2.56 u[IU]/mL (ref 0.450–4.500)

## 2023-09-25 LAB — HEMOGLOBIN A1C
Est. average glucose Bld gHb Est-mCnc: 120 mg/dL
Hgb A1c MFr Bld: 5.8 % — ABNORMAL HIGH (ref 4.8–5.6)

## 2023-09-25 LAB — VITAMIN D 25 HYDROXY (VIT D DEFICIENCY, FRACTURES): Vit D, 25-Hydroxy: 61.4 ng/mL (ref 30.0–100.0)

## 2023-09-25 LAB — B12 AND FOLATE PANEL
Folate: >20 ng/mL (ref >3.0)
Vitamin B-12: 930 pg/mL (ref 232–1245)

## 2023-09-25 NOTE — Progress Notes (Signed)
 Marissa Willis,   A1C is stable in pre-diabetes.  B12 and folate look great.  Vitamin D looks wonderful.  Thyroid normal.  Your iron and iron stores are very elevated. Are you taking oral iron supplements?  Kidney function dropped some and liver enzymes are very elevated! You have had elevated liver enzymes in the past. We suspect due to elevated liver enzymes but I would like for you to see gastroenterologist.

## 2023-10-01 ENCOUNTER — Other Ambulatory Visit: Payer: Self-pay | Admitting: Physician Assistant

## 2023-10-01 DIAGNOSIS — R011 Cardiac murmur, unspecified: Secondary | ICD-10-CM

## 2023-10-01 DIAGNOSIS — R600 Localized edema: Secondary | ICD-10-CM

## 2023-10-01 DIAGNOSIS — I10 Essential (primary) hypertension: Secondary | ICD-10-CM

## 2023-10-01 DIAGNOSIS — I700 Atherosclerosis of aorta: Secondary | ICD-10-CM

## 2023-10-01 DIAGNOSIS — M8580 Other specified disorders of bone density and structure, unspecified site: Secondary | ICD-10-CM

## 2023-10-01 DIAGNOSIS — R7303 Prediabetes: Secondary | ICD-10-CM

## 2023-10-01 DIAGNOSIS — J449 Chronic obstructive pulmonary disease, unspecified: Secondary | ICD-10-CM

## 2023-10-01 DIAGNOSIS — E559 Vitamin D deficiency, unspecified: Secondary | ICD-10-CM

## 2023-10-01 DIAGNOSIS — R0602 Shortness of breath: Secondary | ICD-10-CM

## 2023-10-01 DIAGNOSIS — E782 Mixed hyperlipidemia: Secondary | ICD-10-CM

## 2023-10-01 DIAGNOSIS — R5383 Other fatigue: Secondary | ICD-10-CM

## 2023-10-01 DIAGNOSIS — K760 Fatty (change of) liver, not elsewhere classified: Secondary | ICD-10-CM

## 2023-10-08 ENCOUNTER — Encounter: Payer: Self-pay | Admitting: Physician Assistant

## 2023-10-21 ENCOUNTER — Ambulatory Visit: Admitting: Physician Assistant

## 2023-10-21 ENCOUNTER — Ambulatory Visit (HOSPITAL_BASED_OUTPATIENT_CLINIC_OR_DEPARTMENT_OTHER)
Admission: RE | Admit: 2023-10-21 | Discharge: 2023-10-21 | Disposition: A | Discharge status: Home / Self Care | Source: Ambulatory Visit | Attending: Physician Assistant | Admitting: Physician Assistant

## 2023-10-21 DIAGNOSIS — R011 Cardiac murmur, unspecified: Secondary | ICD-10-CM | POA: Insufficient documentation

## 2023-10-21 DIAGNOSIS — R600 Localized edema: Secondary | ICD-10-CM | POA: Insufficient documentation

## 2023-10-21 DIAGNOSIS — R0602 Shortness of breath: Secondary | ICD-10-CM | POA: Insufficient documentation

## 2023-10-21 LAB — ECHOCARDIOGRAM COMPLETE
AR max vel: 2.11 cm2
AV Area VTI: 2.2 cm2
AV Area mean vel: 2.08 cm2
AV Mean grad: 5.3 mmHg
AV Peak grad: 11.1 mmHg
Ao pk vel: 1.66 m/s
Area-P 1/2: 2.79 cm2
Calc EF: 60.4 %
MV M vel: 3.01 m/s
MV Peak grad: 36.2 mmHg
S' Lateral: 3 cm
Single Plane A2C EF: 65.4 %
Single Plane A4C EF: 59.1 %

## 2023-10-22 ENCOUNTER — Encounter: Payer: Self-pay | Admitting: Physician Assistant

## 2023-10-22 DIAGNOSIS — I340 Nonrheumatic mitral (valve) insufficiency: Secondary | ICD-10-CM | POA: Insufficient documentation

## 2023-10-22 NOTE — Progress Notes (Signed)
 Ejection fraction is normal. Trivial mitral valve regurgitation but not significant.   Overall echo looks great.

## 2023-11-25 ENCOUNTER — Other Ambulatory Visit: Payer: Self-pay | Admitting: Physician Assistant

## 2023-11-25 DIAGNOSIS — Z13820 Encounter for screening for osteoporosis: Secondary | ICD-10-CM

## 2023-11-25 DIAGNOSIS — M8580 Other specified disorders of bone density and structure, unspecified site: Secondary | ICD-10-CM

## 2024-01-06 ENCOUNTER — Ambulatory Visit (INDEPENDENT_AMBULATORY_CARE_PROVIDER_SITE_OTHER): Admitting: Physician Assistant

## 2024-01-06 ENCOUNTER — Encounter: Payer: Self-pay | Admitting: Physician Assistant

## 2024-01-06 VITALS — BP 141/53 | HR 80 | Ht 62.0 in | Wt 213.0 lb

## 2024-01-06 DIAGNOSIS — J449 Chronic obstructive pulmonary disease, unspecified: Secondary | ICD-10-CM

## 2024-01-06 DIAGNOSIS — I10 Essential (primary) hypertension: Secondary | ICD-10-CM

## 2024-01-06 DIAGNOSIS — J4520 Mild intermittent asthma, uncomplicated: Secondary | ICD-10-CM | POA: Diagnosis not present

## 2024-01-06 DIAGNOSIS — G4733 Obstructive sleep apnea (adult) (pediatric): Secondary | ICD-10-CM

## 2024-01-06 DIAGNOSIS — R0602 Shortness of breath: Secondary | ICD-10-CM

## 2024-01-06 MED ORDER — LISINOPRIL-HYDROCHLOROTHIAZIDE 20-25 MG PO TABS: 1.0000 | ORAL_TABLET | Freq: Every day | ORAL | 1 refills | Status: DC

## 2024-01-06 MED ORDER — AIRSUPRA 90-80 MCG/ACT IN AERO: 2.0000 | INHALATION_SPRAY | Freq: Four times a day (QID) | RESPIRATORY_TRACT | 2 refills | Status: DC | PRN

## 2024-01-06 NOTE — Progress Notes (Signed)
 Established Patient Office Visit  Subjective   Patient ID: Marissa Willis, female    DOB: [birth year]  Age: 68 y.o. MRN: [id number]  Chief Complaint  Patient presents with   Medical Management of Chronic Issues    C-pap, referral to neurology    Patient presents today because she states she needs a referral to neurology in order to get a new CPAP. Patient states that earlier last week, her power went out at night and her CPAP stopped working, resulting in a terrible night of sleep and feeling very sick the next day. Her husband was able to get her CPAP to work the next day, but she wanted to make sure she could get a new one because it is about 70 years old. She thought she would need a referral for neurology in order to get the new CPAP. Her last sleep study was in 2013.   She states she is using her albuterol  once in the morning and once at night, but has not been using her Pulmicort  nebulizer. She struggles with ADLs and is often out of breath when ambulating or lifting her cat. She admits that she has used her friend's Breo inhaler, which has not helped.   Patient states her legs are still swelling intermittently, and she has been meaning to get compression socks. She is taking her lisinopril -hydrochlorothiazide  daily. Patient states she stopped eating biscuits, resulting in her 14lb weight loss.  Review of Systems  Respiratory:  Positive for shortness of breath. Negative for cough, hemoptysis and wheezing.   Cardiovascular:  Positive for leg swelling. Negative for chest pain, palpitations and orthopnea.       Intermittent lower leg edema      Objective:     BP (!) 141/53   Pulse 80   Ht 5' 2 (1.575 m)   Wt 213 lb (96.6 kg)   SpO2 99%   BMI 38.96 kg/m  BP Readings from Last 3 Encounters:  01/06/24 (!) 141/53  09/23/23 138/60  11/28/22 (!) 149/71      Physical Exam Vitals reviewed.  Constitutional:      Appearance: Normal appearance. She is normal weight.   Cardiovascular:     Rate and Rhythm: Normal rate and regular rhythm.     Pulses: Normal pulses.     Heart sounds: Murmur heard.  Pulmonary:     Effort: Pulmonary effort is normal.     Breath sounds: Normal breath sounds.  Abdominal:     General: Abdomen is flat.     Palpations: Abdomen is soft.  Musculoskeletal:        General: Normal range of motion.     Cervical back: Normal range of motion.  Skin:    General: Skin is warm.  Neurological:     Mental Status: She is alert.      The 10-year ASCVD risk score (Arnett DK, et al., 2019) is: 17.2%    Assessment & Plan:  Marissa Willis was seen today for medical management of chronic issues.  Diagnoses and all orders for this visit:  OSA on CPAP -     Ambulatory referral to Sleep Studies  Stage 3 severe COPD by GOLD classification (HCC) -     Ambulatory referral to Sleep Studies  Benign essential hypertension -     lisinopril -hydrochlorothiazide  (ZESTORETIC ) 20-25 MG tablet; Take 1 tablet by mouth daily.  Mild intermittent asthma, unspecified whether complicated -     Albuterol -Budesonide  (AIRSUPRA ) 90-80 MCG/ACT AERO; Inhale 2 puffs into the  lungs every 6 (six) hours as needed.   Essential Hypertension  - BP: 135/100; BP recheck: 141/53 - Refills for lisinopril -hydrochlorothiazide  sent into pharmacy  -BP not to goal-work on diet changes and weight loss -recheck in 1-2 month BP-keep log -new CPAP machine could help BP  Severe COPD Stage 3  Shortness of breath on exertion  Obstructive Sleep Apnea - Home sleep study ordered (required d/t last sleep study in 2013- unable to find this in chart); referral to neurology not necessary  - Airsupa sent into pharmacy to see if it will be covered by insurance; patient is still using flonase  daily   Lower leg edema  - Continue combination lisinopril -hydrochlorothiazide   - reccommended purchasing compression socks to help mitigate lower leg swelling and try to elevate legs  at the end of the day   Return in about 3 months (around 04/07/2024).    Alfons Sulkowski, PA-C

## 2024-01-06 NOTE — Patient Instructions (Addendum)
 Switch to airsupra  if affordable Will refer for new home sleep study Stay on same BP medication once a day.

## 2024-01-14 ENCOUNTER — Telehealth: Payer: Self-pay

## 2024-01-14 NOTE — Telephone Encounter (Signed)
 Copied from CRM [id number]. Topic: Clinical - Medical Advice >> Jan 13, 2024  4:22 PM Zane F wrote: Reason for CRM:   Patient is looking to contact the office to inquire about her at home sleep study; The patient was informed at her last visit by her PCP on 01/06/2024 that she would begin processing that for her to have it completed in the near future.   Please call the patient back to further assist.   Callback Number: [id number]

## 2024-01-14 NOTE — Telephone Encounter (Signed)
Sleep study orders, clinical notes, demographics, and copies of insurance cards have been faxed to [facility] at [phone]. Office will contact patient to complete online registration in order to ship home sleep study equipment.

## 2024-01-14 NOTE — Telephone Encounter (Signed)
 Tosha, Will you check into this for the patient?

## 2024-02-05 ENCOUNTER — Encounter: Payer: Self-pay | Admitting: Physician Assistant

## 2024-02-07 ENCOUNTER — Ambulatory Visit: Payer: Self-pay | Admitting: Physician Assistant

## 2024-02-07 DIAGNOSIS — G4733 Obstructive sleep apnea (adult) (pediatric): Secondary | ICD-10-CM | POA: Insufficient documentation

## 2024-02-07 NOTE — Progress Notes (Signed)
 SNAP testing showed severe sleep apnea. You need a CPAP machine. I am ordering this now and a company should be calling you to get this set up. Let me know if you do not hear anything.

## 2024-02-18 NOTE — Telephone Encounter (Signed)
 Can we check on where we sent her CPAP orders?

## 2024-02-21 NOTE — Telephone Encounter (Signed)
 Spoke with patient. Informed that CPAP order was faxed today to [facility].

## 2024-02-25 ENCOUNTER — Ambulatory Visit: Payer: Self-pay | Admitting: Medical-Surgical

## 2024-02-25 ENCOUNTER — Ambulatory Visit (INDEPENDENT_AMBULATORY_CARE_PROVIDER_SITE_OTHER): Admitting: Medical-Surgical

## 2024-02-25 ENCOUNTER — Ambulatory Visit: Payer: Self-pay

## 2024-02-25 ENCOUNTER — Encounter: Payer: Self-pay | Admitting: Medical-Surgical

## 2024-02-25 VITALS — BP 117/56 | HR 56 | Resp 20 | Ht 62.0 in | Wt 217.0 lb

## 2024-02-25 DIAGNOSIS — Z23 Encounter for immunization: Secondary | ICD-10-CM

## 2024-02-25 DIAGNOSIS — H6993 Unspecified Eustachian tube disorder, bilateral: Secondary | ICD-10-CM

## 2024-02-25 DIAGNOSIS — J0100 Acute maxillary sinusitis, unspecified: Secondary | ICD-10-CM

## 2024-02-25 DIAGNOSIS — R42 Dizziness and giddiness: Secondary | ICD-10-CM

## 2024-02-25 LAB — POC COVID19/FLU A&B COMBO
Covid Antigen, POC: NEGATIVE
Influenza A Antigen, POC: NEGATIVE
Influenza B Antigen, POC: NEGATIVE

## 2024-02-25 MED ORDER — AMBULATORY NON FORMULARY MEDICATION: 0 refills | Status: DC

## 2024-02-25 MED ORDER — AZITHROMYCIN 250 MG PO TABS: ORAL_TABLET | ORAL | 0 refills | Status: AC

## 2024-02-25 MED ORDER — MECLIZINE HCL 25 MG PO TABS: 25.0000 mg | ORAL_TABLET | Freq: Three times a day (TID) | ORAL | 0 refills | Status: DC | PRN

## 2024-02-25 NOTE — Telephone Encounter (Signed)
 Patient seen today with Zada Palin, NP

## 2024-02-25 NOTE — Telephone Encounter (Signed)
 FYI Only or Action Required?: Action required by provider: request for appointment.  Patient was last seen in primary care on 01/06/2024 by Antoniette Vermell CROME, PA-C.  Called Nurse Triage reporting Otalgia.  Symptoms began about a month ago.  Interventions attempted: Prescription medications:  SABRA  Symptoms are: gradually worsening.Seen for ear pain 01/06/24.Pain is back and having dizziness now.  Triage Disposition: See Physician Within 24 Hours  Patient/caregiver understands and will follow disposition?:   Copied from CRM [id number]. Topic: Clinical - Red Word Triage >> Feb 25, 2024  8:22 AM Miquel SAILOR wrote: Red Word that prompted transfer to Nurse Triage: RT Ear infection: Dizzyness/water in ear since 07/21 Answer Assessment - Initial Assessment Questions 1. LOCATION: Which ear is involved?     Right 2. ONSET: When did the ear pain start?      01/06/24 3. SEVERITY: How bad is the pain?  (Scale 1-10; mild, moderate or severe)     mild 4. URI SYMPTOMS: Do you have a runny nose or cough?     no 5. FEVER: Do you have a fever? If Yes, ask: What is your temperature, how was it measured, and when did it start?     no 6. CAUSE: Have you been swimming recently?, How often do you use Q-TIPS?, Have you had any recent air travel or scuba diving?     no 7. OTHER SYMPTOMS: Do you have any other symptoms? (e.g., decreased hearing, dizziness, headache, stiff neck, vomiting)     dizzy 8. PREGNANCY: Is there any chance you are pregnant? When was your last menstrual period?     no  Protocols used: Earache-A-AH  Reason for Disposition  Earache  (Exceptions: Brief ear pain of lasting less than 60 minutes, or earache occurring during air travel.)  Answer Assessment - Initial Assessment Questions 1. LOCATION: Which ear is involved?     Right 2. ONSET: When did the ear pain start?      01/06/24 3. SEVERITY: How bad is the pain?  (Scale 1-10; mild, moderate or severe)      mild 4. URI SYMPTOMS: Do you have a runny nose or cough?     no 5. FEVER: Do you have a fever? If Yes, ask: What is your temperature, how was it measured, and when did it start?     no 6. CAUSE: Have you been swimming recently?, How often do you use Q-TIPS?, Have you had any recent air travel or scuba diving?     no 7. OTHER SYMPTOMS: Do you have any other symptoms? (e.g., decreased hearing, dizziness, headache, stiff neck, vomiting)     dizzy 8. PREGNANCY: Is there any chance you are pregnant? When was your last menstrual period?     no  Protocols used: Rilla

## 2024-02-25 NOTE — Progress Notes (Signed)
 Established patient visit   History of Present Illness   Discussed the use of AI scribe software for clinical note transcription with the patient, who gave verbal consent to proceed.  History of Present Illness   Marissa Willis is a 68 year old female who presents with dizziness and ear fullness.  Vertigo and dizziness - Dizziness began this week, worsening from Saturday to Sunday, and became severe by Monday night - Described as a spinning sensation - Exacerbated by certain head positions, especially during neck stretches  Aural fullness and tinnitus - Right ear fullness started about a month ago - Described as a sensation of being underwater - Fullness is intermittent - Flonase  used without relief - Tinnitus present in the left ear  Upper respiratory symptoms - Sinus drainage present - Cough present - Sinus congestion for over a week - Chills present - Loss of appetite present  Gastrointestinal symptoms - Nausea present without vomiting  Medication use and concerns - Ibuprofen taken at night and in the morning - Concern about potential interactions with blood pressure medication     Physical Exam   Physical Exam Vitals reviewed.  Constitutional:      General: She is not in acute distress.    Appearance: Normal appearance.  HENT:     Head: Normocephalic and atraumatic.     Right Ear: External ear normal. A middle ear effusion is present. Tympanic membrane is erythematous.     Left Ear: External ear normal. A middle ear effusion is present. Tympanic membrane is erythematous.     Ears:     Comments: Bilateral ear canal erythema    Nose:     Right Sinus: Maxillary sinus tenderness present. No frontal sinus tenderness.     Left Sinus: Maxillary sinus tenderness present. No frontal sinus tenderness.     Mouth/Throat:     Mouth: Mucous membranes are moist.     Pharynx: Uvula midline. Posterior oropharyngeal erythema present.     Tonsils: No  tonsillar exudate or tonsillar abscesses.  Cardiovascular:     Rate and Rhythm: Normal rate and regular rhythm.     Pulses: Normal pulses.     Heart sounds: Normal heart sounds. No murmur heard.    No friction rub. No gallop.  Pulmonary:     Effort: Pulmonary effort is normal. No respiratory distress.     Breath sounds: Normal breath sounds. No wheezing.  Skin:    General: Skin is warm and dry.  Neurological:     Mental Status: She is alert and oriented to person, place, and time.  Psychiatric:        Mood and Affect: Mood normal.        Behavior: Behavior normal.        Thought Content: Thought content normal.        Judgment: Judgment normal.    Assessment & Plan   Assessment and Plan    Eustachian tube dysfunction with associated vertigo and right ear fullness Intermittent right ear fullness, dizziness, and vertigo suggest eustachian tube dysfunction, possibly due to allergies or viral illness. Differential includes benign positional vertigo. Fluid present behind eardrum. - Prescribed meclizine  for dizziness and vertigo. - Instructed on proper Flonase  administration using crisscross method. - Performed COVID and flu swabs- negative results. - Consider oral steroids if symptoms persist.  Acute sinusitis with postnasal drainage and cough Sinus drainage and cough with maxillary tenderness lasting greater than 1 week suggest bacterial sinusitis.  -  Prescribed azithromycin  (Z-Pak). - Advised Flonase  twice daily with proper technique.     Follow up   Return if symptoms worsen or fail to improve. __________________________________ Zada FREDRIK Palin, DNP, APRN, FNP-BC Primary Care and Sports Medicine [facility]

## 2024-03-03 ENCOUNTER — Ambulatory Visit: Payer: Self-pay | Admitting: *Deleted

## 2024-03-03 NOTE — Telephone Encounter (Signed)
 FYI Only or Action Required?: FYI only for provider.  Patient was last seen in primary care on 02/25/2024 by Willo Mini, NP.  Called Nurse Triage reporting Dizziness (90/43, fatigue).  Symptoms began several weeks ago.  Interventions attempted: Rest, hydration, or home remedies.  Symptoms are: unchanged.  Triage Disposition: See HCP Within 4 Hours (Or PCP Triage)  Patient/caregiver understands and will follow disposition?: Yes   Reason for Disposition  [1] Systolic BP 90-110 AND [2] taking blood pressure medications AND [3] feeling weak or lightheaded  Answer Assessment - Initial Assessment Questions 1. BLOOD PRESSURE: What is your blood pressure? Did you take at least two measurements 5 minutes apart?     111/51,114/55,90/43 2. ONSET: When did you take your blood pressure?    Patient states she has had symptoms for 2 weeks,  Yesterday and today 3. HOW: How did you take your blood pressure? (e.g., visiting nurse, automatic home BP monitor)     Automatic cuff- arm 4. HISTORY: Do you have a history of low blood pressure? What is your blood pressure normally?     No, does take BP medication 5. MEDICINES: Are you taking any medicines for blood pressure? If Yes, ask: Have they been changed recently?     Recent antibiotic, meclizine   6. PULSE RATE: Do you know what your pulse rate is?      84 7. OTHER SYMPTOMS: Have you been sick recently? Have you had a recent injury?     No appetite,dizziness, headache, fatigue  Protocols used: Blood Pressure - Low-A-AH   Copied from CRM #[id number]. Topic: Clinical - Red Word Triage >> Mar 03, 2024  8:17 AM Laurier C wrote: Red Word that prompted transfer to Nurse Triage: Patient states for the last week she has been having headaches, dizzy spells and extreme fatigue.

## 2024-03-03 NOTE — Telephone Encounter (Signed)
 Patient scheduled 09/17/225 with Jade Breeback

## 2024-03-04 ENCOUNTER — Ambulatory Visit (INDEPENDENT_AMBULATORY_CARE_PROVIDER_SITE_OTHER): Admitting: Physician Assistant

## 2024-03-04 ENCOUNTER — Ambulatory Visit: Payer: Self-pay | Admitting: Physician Assistant

## 2024-03-04 ENCOUNTER — Encounter: Payer: Self-pay | Admitting: Physician Assistant

## 2024-03-04 ENCOUNTER — Ambulatory Visit (HOSPITAL_BASED_OUTPATIENT_CLINIC_OR_DEPARTMENT_OTHER)
Admission: RE | Admit: 2024-03-04 | Discharge: 2024-03-04 | Disposition: A | Discharge status: Home / Self Care | Source: Ambulatory Visit | Attending: Physician Assistant | Admitting: Physician Assistant

## 2024-03-04 VITALS — BP 120/30 | HR 62 | Ht 62.0 in | Wt 210.0 lb

## 2024-03-04 DIAGNOSIS — K7460 Unspecified cirrhosis of liver: Secondary | ICD-10-CM

## 2024-03-04 DIAGNOSIS — R002 Palpitations: Secondary | ICD-10-CM | POA: Insufficient documentation

## 2024-03-04 DIAGNOSIS — R42 Dizziness and giddiness: Secondary | ICD-10-CM | POA: Insufficient documentation

## 2024-03-04 DIAGNOSIS — R198 Other specified symptoms and signs involving the digestive system and abdomen: Secondary | ICD-10-CM | POA: Insufficient documentation

## 2024-03-04 DIAGNOSIS — R748 Abnormal levels of other serum enzymes: Secondary | ICD-10-CM

## 2024-03-04 DIAGNOSIS — R350 Frequency of micturition: Secondary | ICD-10-CM | POA: Diagnosis not present

## 2024-03-04 DIAGNOSIS — R0902 Hypoxemia: Secondary | ICD-10-CM | POA: Insufficient documentation

## 2024-03-04 DIAGNOSIS — K2100 Gastro-esophageal reflux disease with esophagitis, without bleeding: Secondary | ICD-10-CM | POA: Insufficient documentation

## 2024-03-04 DIAGNOSIS — N179 Acute kidney failure, unspecified: Secondary | ICD-10-CM | POA: Diagnosis not present

## 2024-03-04 DIAGNOSIS — K921 Melena: Secondary | ICD-10-CM | POA: Insufficient documentation

## 2024-03-04 DIAGNOSIS — D649 Anemia, unspecified: Secondary | ICD-10-CM

## 2024-03-04 HISTORY — DX: Gastro-esophageal reflux disease with esophagitis, without bleeding: K21.00

## 2024-03-04 HISTORY — DX: Unspecified cirrhosis of liver: K74.60

## 2024-03-04 LAB — POCT URINALYSIS DIP (CLINITEK)
Bilirubin, UA: NEGATIVE
Glucose, UA: NEGATIVE mg/dL
Ketones, POC UA: NEGATIVE mg/dL
Leukocytes, UA: NEGATIVE
Nitrite, UA: NEGATIVE
POC PROTEIN,UA: 100 — AB
Spec Grav, UA: 1.01 (ref 1.010–1.025)
Urobilinogen, UA: 0.2 U/dL
pH, UA: 5.5 (ref 5.0–8.0)

## 2024-03-04 MED ORDER — IOHEXOL 300 MG/ML  SOLN
85.0000 mL | Freq: Once | INTRAMUSCULAR | Status: AC | PRN
  Administered 2024-03-04: 85 mL via INTRAVENOUS

## 2024-03-04 MED ORDER — OMEPRAZOLE 40 MG PO CPDR: 40.0000 mg | DELAYED_RELEASE_CAPSULE | Freq: Every day | ORAL | 1 refills | Status: DC

## 2024-03-04 NOTE — Progress Notes (Signed)
 Acute Office Visit  Subjective:     Patient ID: Marissa Willis, female    DOB: 06-07-1956, 68 y.o.   MRN: [id number]  Chief Complaint  Patient presents with   Medical Management of Chronic Issues    fatigue, dizziness, low BP,    HPI Patient is in today for dizziness that started about 10 days ago as well as black tarry stools that started around the same time. She was seen for an office visit by another provider and given Meclizine  for dizziness due to ear fullness. She states the Meclizine  has not helped with her dizziness symptoms. She does still feel like her ear is full.   However, since that visit she has had increased fatigue, palpitations, SOB, and hypotension when checking her blood pressure at home. She states the dizziness is a feeling of presyncope where she feels unsteady on her feet and faint when she changes positions and tries to walk. She has also had visual auras 3x daily and metallic taste in her mouth.   She started noticing her stools were black and so sticky they would not flush. She tried to brush it off because she thought it was just due to blueberries that she had been eating. She does admit to having frequent reflux and indigestion when she eats anything, but she denies any abdominal pain. A couple weeks ago she was taking 200mg  of Ibuprofen once daily for 1 week, but she has not taken it since starting the Meclizine . She also takes an 81 mg baby aspirin daily. Along with these risk factors, she also drinks at least one liquor mixed drink a day.   She also admits to urinary urgency and frequency and is requesting a UA because her mom had frequent UTIs.she denies any fever or chills.      ROS See HPI.     Objective:    BP (!) 120/30   Pulse 62   Ht 5' 2 (1.575 m)   Wt 210 lb (95.3 kg)   SpO2 99%   BMI 38.41 kg/m  BP Readings from Last 3 Encounters:  03/04/24 (!) 120/30  02/25/24 (!) 117/56  01/06/24 (!) 141/53   Wt Readings from Last 3  Encounters:  03/04/24 210 lb (95.3 kg)  02/25/24 217 lb (98.4 kg)  01/06/24 213 lb (96.6 kg)      Physical Exam Constitutional:      Appearance: Normal appearance. She is obese.  HENT:     Head: Normocephalic.     Right Ear: Tympanic membrane, ear canal and external ear normal. There is no impacted cerumen.     Left Ear: Tympanic membrane, ear canal and external ear normal. There is no impacted cerumen.     Nose: Nose normal.     Mouth/Throat:     Mouth: Mucous membranes are moist.  Eyes:     Conjunctiva/sclera: Conjunctivae normal.  Cardiovascular:     Rate and Rhythm: Normal rate and regular rhythm.     Heart sounds: Murmur heard.  Pulmonary:     Effort: Pulmonary effort is normal.     Breath sounds: Normal breath sounds.  Abdominal:     General: Bowel sounds are normal. There is distension.     Palpations: There is no mass.     Tenderness: There is abdominal tenderness. There is right CVA tenderness, left CVA tenderness and guarding. There is no rebound.     Hernia: A hernia is present.     Comments: Ventral hernia Abdominal tenderness  in all quadrants with guarding in right upper quadrant and epigastric region.   Musculoskeletal:     Cervical back: Normal range of motion and neck supple. No tenderness.     Right lower leg: No edema.     Left lower leg: No edema.  Lymphadenopathy:     Cervical: No cervical adenopathy.  Neurological:     Mental Status: She is alert.  Psychiatric:        Mood and Affect: Mood normal.     Results for orders placed or performed in visit on 03/04/24  POCT URINALYSIS DIP (CLINITEK)  Result Value Ref Range   Color, UA yellow yellow   Clarity, UA clear clear   Glucose, UA negative negative mg/dL   Bilirubin, UA negative negative   Ketones, POC UA negative negative mg/dL   Spec Grav, UA 8.989 8.989 - 1.025   Blood, UA moderate (A) negative   pH, UA 5.5 5.0 - 8.0   POC PROTEIN,UA =100 (A) negative, trace   Urobilinogen, UA 0.2 0.2 or  1.0 E.U./dL   Nitrite, UA Negative Negative   Leukocytes, UA Negative Negative        Assessment & Plan:  SABRASABRAElayne Willis was seen today for medical management of chronic issues.  Diagnoses and all orders for this visit:  Abdominal guarding -     CT ABDOMEN PELVIS W CONTRAST; Future -     POCT URINALYSIS DIP (CLINITEK) -     Urine Culture  Palpitations -     CBC w/Diff/Platelet -     CMP14+EGFR -     Fe+TIBC+Fer -     Lipase -     TSH + free T4 -     CT ABDOMEN PELVIS W CONTRAST; Future  Black tarry stools -     CBC w/Diff/Platelet -     CMP14+EGFR -     Fe+TIBC+Fer -     Lipase -     TSH + free T4 -     CT ABDOMEN PELVIS W CONTRAST; Future -     H. pylori breath test  Dizziness -     CBC w/Diff/Platelet -     CMP14+EGFR -     Fe+TIBC+Fer -     Lipase -     TSH + free T4 -     CT ABDOMEN PELVIS W CONTRAST; Future  Hypoxia -     CBC w/Diff/Platelet -     CMP14+EGFR -     Fe+TIBC+Fer -     Lipase -     TSH + free T4 -     CT ABDOMEN PELVIS W CONTRAST; Future  Urinary frequency -     POCT URINALYSIS DIP (CLINITEK) -     Urine Culture  Gastroesophageal reflux disease with esophagitis without hemorrhage -     omeprazole  (PRILOSEC) 40 MG capsule; Take 1 capsule (40 mg total) by mouth daily. -     H. pylori breath test   Concerned that black tarry stool and blood loss is the cause of dizziness/hypoxia/increased reflux UA showed blood and protein Will culture but less likely UTI due to infection H.pylori breath test done today CT of abdomen STAT ordered today: DDx cholecystitis vs gastritis due to alcohol and NSAIDs vs pancreatitis vs diverticulitis vs PUD due alcohol/NSAIDs Cbc/cmp/lipase/serum iron panel ordered today Start omeprazole  40mg  bid right now Avoid all NSAIDS/ASA/Alcohol Will reassess plan after results are back   Vermell Bologna, PA-C

## 2024-03-04 NOTE — Patient Instructions (Addendum)
 Avoid alcohol and anti-inflammatories.  CT scan stat ordered today Get labs today Start omeprazole  twice a day

## 2024-03-04 NOTE — Progress Notes (Signed)
 Marissa Willis,   No acute findings on CT scan today. You do have fatty liver and signs of cirrhosis. Have you discussed this with your gastroenterologist? You really need to stop drinking any alcohol for good. To help your liver.   Nothing to explain symptoms. Will see what labs show.

## 2024-03-05 LAB — CBC WITH DIFFERENTIAL/PLATELET
Basophils Absolute: 0.1 x10E3/uL (ref 0.0–0.2)
Basos: 1 %
EOS (ABSOLUTE): 0.5 x10E3/uL — ABNORMAL HIGH (ref 0.0–0.4)
Eos: 4 %
Hematocrit: 30.1 % — ABNORMAL LOW (ref 34.0–46.6)
Hemoglobin: 9.9 g/dL — ABNORMAL LOW (ref 11.1–15.9)
Immature Grans (Abs): 0.1 x10E3/uL (ref 0.0–0.1)
Immature Granulocytes: 1 %
Lymphocytes Absolute: 3.9 x10E3/uL — ABNORMAL HIGH (ref 0.7–3.1)
Lymphs: 32 %
MCH: 33.3 pg — ABNORMAL HIGH (ref 26.6–33.0)
MCHC: 32.9 g/dL (ref 31.5–35.7)
MCV: 101 fL — ABNORMAL HIGH (ref 79–97)
Monocytes Absolute: 1 x10E3/uL — ABNORMAL HIGH (ref 0.1–0.9)
Monocytes: 8 %
Neutrophils Absolute: 6.7 x10E3/uL (ref 1.4–7.0)
Neutrophils: 54 %
Platelets: 282 x10E3/uL (ref 150–450)
RBC: 2.97 x10E6/uL — ABNORMAL LOW (ref 3.77–5.28)
RDW: 11.7 % (ref 11.7–15.4)
WBC: 12.2 x10E3/uL — ABNORMAL HIGH (ref 3.4–10.8)

## 2024-03-05 LAB — IRON,TIBC AND FERRITIN PANEL
Ferritin: 182 ng/mL — ABNORMAL HIGH (ref 15–150)
Iron Saturation: 25 % (ref 15–55)
Iron: 104 ug/dL (ref 27–139)
Total Iron Binding Capacity: 414 ug/dL (ref 250–450)
UIBC: 310 ug/dL (ref 118–369)

## 2024-03-05 LAB — CMP14+EGFR
ALT: 62 IU/L — ABNORMAL HIGH (ref 0–32)
AST: 72 IU/L — ABNORMAL HIGH (ref 0–40)
Albumin: 4.5 g/dL (ref 3.9–4.9)
Alkaline Phosphatase: 78 IU/L (ref 49–135)
BUN/Creatinine Ratio: 12 (ref 12–28)
BUN: 61 mg/dL — ABNORMAL HIGH (ref 8–27)
Bilirubin Total: 0.4 mg/dL (ref 0.0–1.2)
CO2: 26 mmol/L (ref 20–29)
Calcium: 17 mg/dL (ref 8.7–10.3)
Chloride: 94 mmol/L — ABNORMAL LOW (ref 96–106)
Creatinine, Ser: 5.15 mg/dL — ABNORMAL HIGH (ref 0.57–1.00)
Globulin, Total: 3 g/dL (ref 1.5–4.5)
Glucose: 101 mg/dL — ABNORMAL HIGH (ref 70–99)
Potassium: 3.7 mmol/L (ref 3.5–5.2)
Sodium: 136 mmol/L (ref 134–144)
Total Protein: 7.5 g/dL (ref 6.0–8.5)
eGFR: 8 mL/min/1.73 — ABNORMAL LOW (ref >59)

## 2024-03-05 LAB — TSH+FREE T4
Free T4: 0.92 ng/dL (ref 0.82–1.77)
TSH: 2.51 u[IU]/mL (ref 0.450–4.500)

## 2024-03-05 LAB — LIPASE: Lipase: 113 U/L — ABNORMAL HIGH (ref 14–72)

## 2024-03-06 ENCOUNTER — Encounter (HOSPITAL_COMMUNITY): Payer: Self-pay | Admitting: Internal Medicine

## 2024-03-06 ENCOUNTER — Observation Stay (HOSPITAL_COMMUNITY)

## 2024-03-06 ENCOUNTER — Other Ambulatory Visit: Payer: Self-pay

## 2024-03-06 ENCOUNTER — Inpatient Hospital Stay (HOSPITAL_COMMUNITY)
Admission: EM | Admit: 2024-03-06 | Discharge: 2024-03-14 | DRG: 682 | Disposition: A | Discharge status: Home / Self Care | Source: Ambulatory Visit | Attending: Internal Medicine | Admitting: Internal Medicine

## 2024-03-06 ENCOUNTER — Emergency Department (HOSPITAL_COMMUNITY)

## 2024-03-06 DIAGNOSIS — Z87891 Personal history of nicotine dependence: Secondary | ICD-10-CM

## 2024-03-06 DIAGNOSIS — D649 Anemia, unspecified: Secondary | ICD-10-CM | POA: Insufficient documentation

## 2024-03-06 DIAGNOSIS — M810 Age-related osteoporosis without current pathological fracture: Secondary | ICD-10-CM | POA: Diagnosis present

## 2024-03-06 DIAGNOSIS — I10 Essential (primary) hypertension: Secondary | ICD-10-CM | POA: Diagnosis present

## 2024-03-06 DIAGNOSIS — K3189 Other diseases of stomach and duodenum: Secondary | ICD-10-CM | POA: Diagnosis present

## 2024-03-06 DIAGNOSIS — K5900 Constipation, unspecified: Secondary | ICD-10-CM | POA: Diagnosis present

## 2024-03-06 DIAGNOSIS — E042 Nontoxic multinodular goiter: Secondary | ICD-10-CM | POA: Diagnosis present

## 2024-03-06 DIAGNOSIS — Z23 Encounter for immunization: Secondary | ICD-10-CM

## 2024-03-06 DIAGNOSIS — E8770 Fluid overload, unspecified: Secondary | ICD-10-CM | POA: Diagnosis not present

## 2024-03-06 DIAGNOSIS — E782 Mixed hyperlipidemia: Secondary | ICD-10-CM | POA: Diagnosis present

## 2024-03-06 DIAGNOSIS — J449 Chronic obstructive pulmonary disease, unspecified: Secondary | ICD-10-CM

## 2024-03-06 DIAGNOSIS — N179 Acute kidney failure, unspecified: Secondary | ICD-10-CM | POA: Insufficient documentation

## 2024-03-06 DIAGNOSIS — Z6841 Body Mass Index (BMI) 40.0 and over, adult: Secondary | ICD-10-CM

## 2024-03-06 DIAGNOSIS — Z833 Family history of diabetes mellitus: Secondary | ICD-10-CM

## 2024-03-06 DIAGNOSIS — I129 Hypertensive chronic kidney disease with stage 1 through stage 4 chronic kidney disease, or unspecified chronic kidney disease: Secondary | ICD-10-CM | POA: Diagnosis present

## 2024-03-06 DIAGNOSIS — E869 Volume depletion, unspecified: Secondary | ICD-10-CM | POA: Diagnosis present

## 2024-03-06 DIAGNOSIS — E876 Hypokalemia: Secondary | ICD-10-CM | POA: Diagnosis present

## 2024-03-06 DIAGNOSIS — Z888 Allergy status to other drugs, medicaments and biological substances status: Secondary | ICD-10-CM

## 2024-03-06 DIAGNOSIS — D500 Iron deficiency anemia secondary to blood loss (chronic): Secondary | ICD-10-CM | POA: Diagnosis present

## 2024-03-06 DIAGNOSIS — I700 Atherosclerosis of aorta: Secondary | ICD-10-CM | POA: Diagnosis present

## 2024-03-06 DIAGNOSIS — G4733 Obstructive sleep apnea (adult) (pediatric): Secondary | ICD-10-CM | POA: Diagnosis present

## 2024-03-06 DIAGNOSIS — E66812 Obesity, class 2: Secondary | ICD-10-CM | POA: Diagnosis present

## 2024-03-06 DIAGNOSIS — I340 Nonrheumatic mitral (valve) insufficiency: Secondary | ICD-10-CM | POA: Diagnosis present

## 2024-03-06 DIAGNOSIS — Z5986 Financial insecurity: Secondary | ICD-10-CM

## 2024-03-06 DIAGNOSIS — K2100 Gastro-esophageal reflux disease with esophagitis, without bleeding: Secondary | ICD-10-CM | POA: Diagnosis present

## 2024-03-06 DIAGNOSIS — D62 Acute posthemorrhagic anemia: Secondary | ICD-10-CM | POA: Diagnosis not present

## 2024-03-06 DIAGNOSIS — K31811 Angiodysplasia of stomach and duodenum with bleeding: Secondary | ICD-10-CM | POA: Clinically undetermined

## 2024-03-06 DIAGNOSIS — Z87442 Personal history of urinary calculi: Secondary | ICD-10-CM

## 2024-03-06 DIAGNOSIS — R195 Other fecal abnormalities: Secondary | ICD-10-CM | POA: Diagnosis present

## 2024-03-06 DIAGNOSIS — R7401 Elevation of levels of liver transaminase levels: Secondary | ICD-10-CM | POA: Diagnosis present

## 2024-03-06 DIAGNOSIS — G40209 Localization-related (focal) (partial) symptomatic epilepsy and epileptic syndromes with complex partial seizures, not intractable, without status epilepticus: Secondary | ICD-10-CM | POA: Diagnosis present

## 2024-03-06 DIAGNOSIS — K7460 Unspecified cirrhosis of liver: Secondary | ICD-10-CM | POA: Diagnosis present

## 2024-03-06 DIAGNOSIS — Z8249 Family history of ischemic heart disease and other diseases of the circulatory system: Secondary | ICD-10-CM

## 2024-03-06 DIAGNOSIS — Z723 Lack of physical exercise: Secondary | ICD-10-CM

## 2024-03-06 DIAGNOSIS — J810 Acute pulmonary edema: Secondary | ICD-10-CM | POA: Diagnosis not present

## 2024-03-06 DIAGNOSIS — K760 Fatty (change of) liver, not elsewhere classified: Secondary | ICD-10-CM | POA: Diagnosis present

## 2024-03-06 DIAGNOSIS — J4489 Other specified chronic obstructive pulmonary disease: Secondary | ICD-10-CM | POA: Diagnosis present

## 2024-03-06 DIAGNOSIS — N1831 Chronic kidney disease, stage 3a: Secondary | ICD-10-CM | POA: Diagnosis present

## 2024-03-06 DIAGNOSIS — F0390 Unspecified dementia without behavioral disturbance: Secondary | ICD-10-CM | POA: Diagnosis present

## 2024-03-06 DIAGNOSIS — R748 Abnormal levels of other serum enzymes: Secondary | ICD-10-CM | POA: Insufficient documentation

## 2024-03-06 LAB — URINALYSIS, W/ REFLEX TO CULTURE (INFECTION SUSPECTED)
Bacteria, UA: NONE SEEN
Bilirubin Urine: NEGATIVE
Glucose, UA: NEGATIVE mg/dL
Hgb urine dipstick: NEGATIVE
Ketones, ur: NEGATIVE mg/dL
Nitrite: NEGATIVE
Protein, ur: 100 mg/dL — AB
Specific Gravity, Urine: 1.016 (ref 1.005–1.030)
pH: 6 (ref 5.0–8.0)

## 2024-03-06 LAB — H. PYLORI BREATH TEST: H pylori Breath Test: NEGATIVE

## 2024-03-06 LAB — CBC WITH DIFFERENTIAL/PLATELET
Abs Immature Granulocytes: 0.05 K/uL (ref 0.00–0.07)
Basophils Absolute: 0.1 K/uL (ref 0.0–0.1)
Basophils Relative: 1 %
Eosinophils Absolute: 0.4 K/uL (ref 0.0–0.5)
Eosinophils Relative: 3 %
HCT: 30.5 % — ABNORMAL LOW (ref 36.0–46.0)
Hemoglobin: 10 g/dL — ABNORMAL LOW (ref 12.0–15.0)
Immature Granulocytes: 1 %
Lymphocytes Relative: 29 %
Lymphs Abs: 3 K/uL (ref 0.7–4.0)
MCH: 32.4 pg (ref 26.0–34.0)
MCHC: 32.8 g/dL (ref 30.0–36.0)
MCV: 98.7 fL (ref 80.0–100.0)
Monocytes Absolute: 0.9 K/uL (ref 0.1–1.0)
Monocytes Relative: 8 %
Neutro Abs: 6 K/uL (ref 1.7–7.7)
Neutrophils Relative %: 58 %
Platelets: 276 K/uL (ref 150–400)
RBC: 3.09 MIL/uL — ABNORMAL LOW (ref 3.87–5.11)
RDW: 12.2 % (ref 11.5–15.5)
WBC: 10.4 K/uL (ref 4.0–10.5)
nRBC: 0 % (ref 0.0–0.2)

## 2024-03-06 LAB — COMPREHENSIVE METABOLIC PANEL WITH GFR
ALT: 78 U/L — ABNORMAL HIGH (ref 0–44)
AST: 86 U/L — ABNORMAL HIGH (ref 15–41)
Albumin: 4.5 g/dL (ref 3.5–5.0)
Alkaline Phosphatase: 82 U/L (ref 38–126)
Anion gap: 15 (ref 5–15)
BUN: 58 mg/dL — ABNORMAL HIGH (ref 8–23)
CO2: 26 mmol/L (ref 22–32)
Calcium: >15 mg/dL (ref 8.9–10.3)
Chloride: 96 mmol/L — ABNORMAL LOW (ref 98–111)
Creatinine, Ser: 6 mg/dL — ABNORMAL HIGH (ref 0.44–1.00)
GFR, Estimated: 7 mL/min — ABNORMAL LOW (ref >60)
Glucose, Bld: 92 mg/dL (ref 70–99)
Potassium: 3.5 mmol/L (ref 3.5–5.1)
Sodium: 137 mmol/L (ref 135–145)
Total Bilirubin: 0.3 mg/dL (ref 0.0–1.2)
Total Protein: 7.9 g/dL (ref 6.5–8.1)

## 2024-03-06 LAB — PROTEIN / CREATININE RATIO, URINE
Creatinine, Urine: 77 mg/dL
Protein Creatinine Ratio: 0.54 mg/mg{creat} — ABNORMAL HIGH (ref 0.00–0.15)
Total Protein, Urine: 42 mg/dL

## 2024-03-06 LAB — SODIUM, URINE, RANDOM: Sodium, Ur: 37 mmol/L

## 2024-03-06 LAB — MAGNESIUM: Magnesium: 4.3 mg/dL — ABNORMAL HIGH (ref 1.7–2.4)

## 2024-03-06 LAB — BASIC METABOLIC PANEL WITH GFR
Anion gap: 13 (ref 5–15)
BUN: 65 mg/dL — ABNORMAL HIGH (ref 8–23)
CO2: 27 mmol/L (ref 22–32)
Calcium: >15 mg/dL (ref 8.9–10.3)
Chloride: 102 mmol/L (ref 98–111)
Creatinine, Ser: 5.1 mg/dL — ABNORMAL HIGH (ref 0.44–1.00)
GFR, Estimated: 9 mL/min — ABNORMAL LOW (ref >60)
Glucose, Bld: 96 mg/dL (ref 70–99)
Potassium: 3.2 mmol/L — ABNORMAL LOW (ref 3.5–5.1)
Sodium: 141 mmol/L (ref 135–145)

## 2024-03-06 LAB — URINE CULTURE: Organism ID, Bacteria: NO GROWTH

## 2024-03-06 LAB — CK: Total CK: 323 U/L — ABNORMAL HIGH (ref 38–234)

## 2024-03-06 LAB — PHOSPHORUS: Phosphorus: 3.4 mg/dL (ref 2.5–4.6)

## 2024-03-06 LAB — LIPASE, BLOOD: Lipase: 107 U/L — ABNORMAL HIGH (ref 11–51)

## 2024-03-06 LAB — CREATININE, URINE, RANDOM: Creatinine, Urine: 78 mg/dL

## 2024-03-06 LAB — VITAMIN D 25 HYDROXY (VIT D DEFICIENCY, FRACTURES): Vit D, 25-Hydroxy: 53.8 ng/mL (ref 30–100)

## 2024-03-06 LAB — POC OCCULT BLOOD, ED: Fecal Occult Bld: POSITIVE — AB

## 2024-03-06 LAB — PRO BRAIN NATRIURETIC PEPTIDE: Pro Brain Natriuretic Peptide: 192 pg/mL (ref <300.0)

## 2024-03-06 LAB — ALBUMIN: Albumin: 3.9 g/dL (ref 3.5–5.0)

## 2024-03-06 MED ORDER — ONDANSETRON HCL 4 MG PO TABS: 4.0000 mg | ORAL_TABLET | Freq: Four times a day (QID) | ORAL | Status: DC | PRN

## 2024-03-06 MED ORDER — SODIUM CHLORIDE 0.9 % IV BOLUS
1000.0000 mL | Freq: Once | INTRAVENOUS | Status: AC
  Administered 2024-03-06: 1000 mL via INTRAVENOUS

## 2024-03-06 MED ORDER — ACETAMINOPHEN 650 MG RE SUPP: 650.0000 mg | Freq: Four times a day (QID) | RECTAL | Status: DC | PRN

## 2024-03-06 MED ORDER — ZOLEDRONIC ACID 4 MG/5ML IV CONC
3.0000 mg | Freq: Once | INTRAVENOUS | Status: AC
  Administered 2024-03-06: 3 mg via INTRAVENOUS
  Filled 2024-03-06: qty 3.75

## 2024-03-06 MED ORDER — SODIUM CHLORIDE 0.9 % IV BOLUS
2500.0000 mL | Freq: Once | INTRAVENOUS | Status: AC
  Administered 2024-03-06: 2500 mL via INTRAVENOUS

## 2024-03-06 MED ORDER — CALCITONIN (SALMON) 200 UNIT/ML IJ SOLN
4.0000 [IU]/kg | Freq: Two times a day (BID) | INTRAMUSCULAR | Status: AC
  Administered 2024-03-07 – 2024-03-08 (×2): 382 [IU] via SUBCUTANEOUS
  Filled 2024-03-06 (×4): qty 1.91

## 2024-03-06 MED ORDER — INFLUENZA VAC SPLIT HIGH-DOSE 0.5 ML IM SUSY
0.5000 mL | PREFILLED_SYRINGE | INTRAMUSCULAR | Status: DC
  Filled 2024-03-06 (×2): qty 0.5

## 2024-03-06 MED ORDER — ACETAMINOPHEN 325 MG PO TABS
650.0000 mg | ORAL_TABLET | Freq: Four times a day (QID) | ORAL | Status: DC | PRN
  Administered 2024-03-07 – 2024-03-10 (×4): 650 mg via ORAL
  Filled 2024-03-06 (×5): qty 2

## 2024-03-06 MED ORDER — CALCITONIN (SALMON) 200 UNIT/ML IJ SOLN: 4.0000 [IU]/kg | Freq: Two times a day (BID) | INTRAMUSCULAR | Status: DC

## 2024-03-06 MED ORDER — ENSURE PLUS HIGH PROTEIN PO LIQD
237.0000 mL | Freq: Two times a day (BID) | ORAL | Status: DC
  Administered 2024-03-06 – 2024-03-13 (×10): 237 mL via ORAL

## 2024-03-06 MED ORDER — SODIUM CHLORIDE 0.9 % IV SOLN: INTRAVENOUS | Status: DC

## 2024-03-06 MED ORDER — ONDANSETRON HCL 4 MG/2ML IJ SOLN
4.0000 mg | Freq: Four times a day (QID) | INTRAMUSCULAR | Status: DC | PRN
  Administered 2024-03-07 – 2024-03-09 (×2): 4 mg via INTRAVENOUS
  Filled 2024-03-06 (×2): qty 2

## 2024-03-06 NOTE — Progress Notes (Signed)
 Pt called and told to go to ED this morning due to AKI, elevated lipase, elevated WBC and anemia.   No bacteria growth in urine culture.  Normal thyroid.

## 2024-03-06 NOTE — Progress Notes (Signed)
 Chaplains received a spiritual care consult to assist Marissa Willis with advance directive paperwork.  I provided education and we spoke about the differences between becoming DNR now and choosing to decline life-prolonging measures if she were to find herself in the situations outlined in the living will.  She was grateful for the clarification.  She would want her husband to be decision maker if she could not.  She shared about being a caregiver to her mom and about watching her sister go through intense treatment for cancer at the age of 11 only to die within a few months anyway.  From those experiences, she neither wants to be a burden for her husband, but also doesn't want to put her and her family through treatments that may not help prolong life anyway.  We discussed that there are many different diagnoses, treatments and prognoses when it comes to cancer and this has also given her some things to consider. She expressed appreciation for the conversation and the paperwork that she can look over.

## 2024-03-06 NOTE — ED Provider Notes (Signed)
 [place] EMERGENCY DEPARTMENT AT [facility] Provider Note   CSN: [id number] Arrival date & time: 03/06/24  1015     Patient presents with: Abnormal Lab   Marissa Willis is a 68 y.o. female.   68 year old female who presents due to concern for acute kidney failure.  Patient has been feeling weak for several days and saw her doctor last week for same.  Was seen again this week for recheck and continued to endorse weakness with some dark stools.  Patient had blood work done 2 days ago and those results showed a creatinine above 5.  Patient also had a CT done which showed fatty liver disease according to her record review.  Patient denies any prior history of chronic kidney disease.  She has not had any hematemesis.  No abdominal discomfort.  Does endorse some increased dyspnea exertion as well as some lower extremity edema.  No recent medication changes       Prior to Admission medications   Medication Sig Start Date End Date Taking? Authorizing Provider  Albuterol  Sulfate (PROAIR  RESPICLICK) 108 (90 Base) MCG/ACT AEPB Inhale 2 puffs into the lungs every 6 (six) hours as needed (shortness of breath/wheezing). 09/23/23   Breeback, Jade L, PA-C  Albuterol -Budesonide  (AIRSUPRA ) 90-80 MCG/ACT AERO Inhale 2 puffs into the lungs every 6 (six) hours as needed. 01/06/24   Breeback, Jade L, PA-C  AMBULATORY NON FORMULARY MEDICATION Medication Name: 2025-2026 Covid vaccine- diagnosis: age greater than 65y, COPD, asthma 02/25/24   Willo Mini, NP  fluticasone  (FLONASE ) 50 MCG/ACT nasal spray Place 1 spray into both nostrils daily. 08/28/22   Breeback, Jade L, PA-C  ipratropium-albuterol  (DUONEB) 0.5-2.5 (3) MG/3ML SOLN Take 3 mLs by nebulization every 6 (six) hours as needed. 11/28/22   Breeback, Jade L, PA-C  lisinopril -hydrochlorothiazide  (ZESTORETIC ) 20-25 MG tablet Take 1 tablet by mouth daily. 01/06/24   Breeback, Jade L, PA-C  Multiple Vitamin (MULTIVITAMIN) capsule Take 1 capsule by  mouth daily.    [provider]  Multiple Vitamins-Minerals (ICAPS) TABS Take by mouth.    [provider]  omeprazole  (PRILOSEC) 40 MG capsule Take 1 capsule (40 mg total) by mouth daily. 03/04/24   Breeback, Jade L, PA-C  ZYPITAMAG  4 MG TABS Take 1 tablet (4 mg total) by mouth daily. 09/24/23   Breeback, Jade L, PA-C    Allergies: Atorvastatin  and Crestor  [rosuvastatin ]    Review of Systems  All other systems reviewed and are negative.   Updated Vital Signs BP 129/73 (BP Location: Right Arm)   Pulse 63   Temp 97.8 F (36.6 C) (Oral)   Resp 18   SpO2 96%   Physical Exam Vitals and nursing note reviewed.  Constitutional:      General: She is not in acute distress.    Appearance: Normal appearance. She is well-developed. She is not toxic-appearing.  HENT:     Head: Normocephalic and atraumatic.  Eyes:     General: Lids are normal.     Conjunctiva/sclera: Conjunctivae normal.     Pupils: Pupils are equal, round, and reactive to light.  Neck:     Thyroid: No thyroid mass.     Trachea: No tracheal deviation.  Cardiovascular:     Rate and Rhythm: Normal rate and regular rhythm.     Heart sounds: Normal heart sounds. No murmur heard.    No gallop.  Pulmonary:     Effort: Pulmonary effort is normal. No respiratory distress.     Breath sounds: Normal  breath sounds. No stridor. No decreased breath sounds, wheezing, rhonchi or rales.  Abdominal:     General: There is no distension.     Palpations: Abdomen is soft.     Tenderness: There is no abdominal tenderness. There is no rebound.  Musculoskeletal:        General: No tenderness. Normal range of motion.     Cervical back: Normal range of motion and neck supple.  Lymphadenopathy:     Comments: 2+ lower extremity edema  Skin:    General: Skin is warm and dry.     Findings: No abrasion or rash.  Neurological:     Mental Status: She is alert and oriented to person, place, and time. Mental status is at  baseline.     GCS: GCS eye subscore is 4. GCS verbal subscore is 5. GCS motor subscore is 6.     Cranial Nerves: No cranial nerve deficit.     Sensory: No sensory deficit.     Motor: Motor function is intact.  Psychiatric:        Attention and Perception: Attention normal.        Speech: Speech normal.        Behavior: Behavior normal.     (all labs ordered are listed, but only abnormal results are displayed) Labs Reviewed  COMPREHENSIVE METABOLIC PANEL WITH GFR  CBC WITH DIFFERENTIAL/PLATELET  URINALYSIS, W/ REFLEX TO CULTURE (INFECTION SUSPECTED)  PRO BRAIN NATRIURETIC PEPTIDE  POC OCCULT BLOOD, ED    EKG: EKG Interpretation Date/Time:  Friday March 06 2024 11:14:54 EDT Ventricular Rate:  55 PR Interval:  178 QRS Duration:  100 QT Interval:  400 QTC Calculation: 382 R Axis:   7  Text Interpretation: Sinus bradycardia Nonspecific ST and T wave abnormality Abnormal ECG When compared with ECG of 10-Jan-2016 12:13, PREVIOUS ECG IS PRESENT No significant change since last tracing Confirmed by Dasie Faden ([id number]) on 03/06/2024 12:40:27 PM  Radiology: CT ABDOMEN PELVIS W CONTRAST Result Date: 03/04/2024 CLINICAL DATA:  Black stool. EXAM: CT ABDOMEN AND PELVIS WITH CONTRAST TECHNIQUE: Multidetector CT imaging of the abdomen and pelvis was performed using the standard protocol following bolus administration of intravenous contrast. RADIATION DOSE REDUCTION: This exam was performed according to the departmental dose-optimization program which includes automated exposure control, adjustment of the mA and/or kV according to patient size and/or use of iterative reconstruction technique. CONTRAST:  85mL OMNIPAQUE  IOHEXOL  300 MG/ML  SOLN COMPARISON:  September 17, 2022. FINDINGS: Lower chest: No acute abnormality. Hepatobiliary: Hepatic steatosis and nodular contours suggesting hepatic cirrhosis. No cholelithiasis or biliary dilatation is noted. Left hepatic cyst is noted. Pancreas:  Unremarkable. No pancreatic ductal dilatation or surrounding inflammatory changes. Spleen: Normal in size without focal abnormality. Adrenals/Urinary Tract: Adrenal glands are unremarkable. Kidneys are normal, without renal calculi, focal lesion, or hydronephrosis. Bladder is unremarkable. Stomach/Bowel: Stomach is within normal limits. Appendix appears normal. No evidence of bowel wall thickening, distention, or inflammatory changes. Sigmoid diverticulosis without inflammation. Vascular/Lymphatic: Aortic atherosclerosis. No enlarged abdominal or pelvic lymph nodes. Reproductive: Uterus and bilateral adnexa are unremarkable. Other: No abdominal wall hernia or abnormality. No abdominopelvic ascites. Musculoskeletal: No acute or significant osseous findings. IMPRESSION: 1. Hepatic steatosis and nodular hepatic contours suggesting hepatic cirrhosis. 2. Sigmoid diverticulosis without inflammation. 3. Aortic atherosclerosis. Aortic Atherosclerosis (ICD10-I70.0). Electronically Signed   By: Lynwood Landy Raddle M.D.   On: 03/04/2024 15:13     Procedures   Medications Ordered in the ED - No data to display  Medical Decision Making Amount and/or Complexity of Data Reviewed Labs: ordered. Radiology: ordered. ECG/medicine tests: ordered.  Risk Prescription drug management.   Patient is labs consistent with hypercalcemia as well has acute kidney injury.  Unclear etiology.  Patient's potassium is within normal limits.  Patient is EKG shows normal sinus rhythm. Chest x-ray shows no evidence of heart failure.  Patient started on IV fluids for her hypercalcemia.  Will consult hospitalist for admission.  Family at bedside and notified    Final diagnoses:  None    ED Discharge Orders     None          Dasie Faden, MD 03/06/24 1241

## 2024-03-06 NOTE — Consult Note (Addendum)
 Renal Service Consult Note [facility] Lamar JONETTA Fret, MD  Patient: Marissa Willis Date: 03/06/2024 Requesting Physician: Dr. Celinda  Reason for Consult: Renal failure HPI: The patient is a 68 y.o. year-old w/ PMH as below who presented to ED due to outpatient kidney labs being high. No n/v/d. In ED today, BP 129/73, HR 56, RR 18, 96% on RA. Na 137, K 3.5, CO2 26, BUN 58, creat 6.00, calcium  > 15.0 (labs from PCP's office 2 days ago also returned high w/ creat at 5.1, and high Ca++ at 17).  AST / ALT slightly up < 100. Tbili 0.3. Alb 4.5, total prot 7.9. CPK 323, BNP 192.  WBC 10K, Hgb 10.0, plts wnl. In ED pt rec'd 1 L NS bolus then NS at 125 cc/hr. Also she was given IV zometa  (3mg  x 1), and she got another 1 L NS bolus. CXR was negative. CT abd / pelvis w/ contrast done 2 days ago showed nodular liver changes suggesting cirrhosis. No acute findings. Pt was admitted to medical service. We are asked to see for renal failure.   Pt w/ hx of COPD, complex seizure d/o, memory changes, kidney stones, HL, OSA on cpap, liver cirrhosis.    Pt seen in room. Has not been eating well, low energy and metallic taste in the mouth. No SOB or swelling. No hx kidney problems. No hx of using OTC Ca++ products.    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   History of kidney stones    History of partial seizures    x2 seizure in 2014  dx complex parital seizures  started on keppra -- per pt no seizures since off keppra    Hyperlipidemia    Memory changes 09/09/2019   OSA on CPAP    Pre-diabetes 09/24/2023   Pulmonary nodules 11/22/2021   Retained ureteral stent    right side migrated   SUI (stress urinary incontinence, female)    mild   Vitamin D  insufficiency 09/23/2023   Past Surgical History  Past Surgical History:  Procedure Laterality Date   ANAL FISSURE REPAIR  1994   BLADDER SUSPENSION  1991   CEREBRAL EMBOLIZATION  2011     at [facility]   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   CYSTOSCOPY WITH URETEROSCOPY AND STENT PLACEMENT Right 12/09/2015   Procedure: CYSTOSCOPY WITH retrograde AND STENT PLACEMENT;  Surgeon: Garnette Shack, MD;  Location: [facility];  Service: Urology;  Laterality: Right;   CYSTOSCOPY WITH URETEROSCOPY AND STENT PLACEMENT Right 02/23/2016   Procedure: CYSTOSCOPY  AND STENT EXTRACTION;  Surgeon: Garnette Shack, MD;  Location: [facility];  Service: Urology;  Laterality: Right;   IR GENERIC HISTORICAL  01/12/2016   IR URETERAL STENT RIGHT NEW ACCESS W/O SEP NEPHROSTOMY CATH 01/12/2016 Ami Bellman, DO [facility]   NEPHROLITHOTOMY Right 01/12/2016   Procedure: RIGHT  PERCUTANEOUS NEPHROLITHOTOMY  insertion double j stent flexible  cystoscopy  removable right double j stent;  Surgeon: Garnette Shack, MD;  Location: [facility];  Service: Urology;  Laterality: Right;   TUBAL LIGATION  1991   Family History  Family History  Problem Relation Age of Onset   Diabetes Mother    Heart disease Mother    Hypertension Mother    Heart disease Father    Social History  reports that she quit smoking about 6 years ago. Her smoking use included cigarettes. She started smoking about 51 years ago. She has a 67.5 pack-year smoking history. She has never used smokeless tobacco. She reports current alcohol use. She reports current drug use. Drug: Marijuana. Allergies  Allergies  Allergen Reactions    Atorvastatin      Muscle aches    Crestor  [Rosuvastatin ]     Muscle aches    Home medications Prior to Admission medications   Medication Sig Start Date End Date Taking? Authorizing Provider  Albuterol  Sulfate (PROAIR  RESPICLICK) 108 (90 Base) MCG/ACT AEPB Inhale 2 puffs into the lungs every 6 (six) hours as needed (shortness of breath/wheezing). 09/23/23  Yes Breeback, Jade L, PA-C  fluticasone  (FLONASE ) 50 MCG/ACT nasal spray Place 1 spray into both nostrils daily. 08/28/22  Yes Breeback, Jade L, PA-C  lisinopril -hydrochlorothiazide  (ZESTORETIC ) 20-25 MG tablet Take 1 tablet by mouth daily. 01/06/24  Yes Breeback, Jade L, PA-C  Multiple Vitamin (MULTIVITAMIN) capsule Take 1 capsule by mouth daily.   Yes [provider]  ZYPITAMAG  4 MG TABS Take 1 tablet (4 mg total) by mouth daily. 09/24/23  Yes Breeback, Jade L, PA-C  Albuterol -Budesonide  (AIRSUPRA ) 90-80 MCG/ACT AERO Inhale 2 puffs into the lungs every 6 (six) hours as needed. Patient not taking: Reported on 03/06/2024 01/06/24   Antoniette Vermell CROME, PA-C  ipratropium-albuterol  (DUONEB) 0.5-2.5 (3) MG/3ML SOLN Take 3 mLs by nebulization every 6 (six) hours as needed. Patient not taking: Reported on 03/06/2024 11/28/22   Antoniette Vermell CROME, PA-C  omeprazole  (PRILOSEC) 40 MG capsule Take 1 capsule (40 mg total) by mouth daily. Patient not taking: Reported on 03/06/2024 03/04/24   Antoniette Vermell CROME, PA-C     Vitals:   03/06/24 1021 03/06/24 1417  BP: 129/73 (!) 146/53  Pulse: 63 (!) 58  Resp: 18   Temp: 97.8 F (36.6 C) 99 F (37.2 C)  TempSrc: Oral Oral  SpO2: 96% 96%   Exam Gen alert, no distress Sclera anicteric, throat slightly dry No jvd or bruits Chest clear bilat to bases RRR no MRG Abd soft ntnd no mass or ascites +bs Ext no LE or UE edema, no other edema Neuro is alert, Ox 3 , nf   Home meds: Albuterol  Airsupra  Duoneb Lisinopril -hydrochlorothiazide  20-25 every day MVI Prilosec 40 every day   Date   Creat  eGFR  (ml/min) 2014- feb 2017 0.80- 0.95 June 2017  1.23- 1.40 12/2015- 11/2022 0.72- 1.04 55- > 60 ml/min 09/24/23   1.10  54 ml/min 03/04/24  5.15  8 03/06/24  6.00  7      UA trace LE, prot 100, 0-5 rbc/ epi, 6-10 wbc's UNa, UCr pending Renal US  pending CXR - no active disease  Symptoms tired, no appetite and metallic taste aKI and/or mikael +acei w/ AKI, severe ^Ca++ new onset ^ca - +hydrochlorothiazide , r/o cancer to bones, no hx antacids Add calcitonin Bolus 2.5 L NS Foley cath ^NS 150 cc/hr Spep, upep, FLC. ,myeloma survey CT +contrast  Assessment/ Plan: AKI: b/l creatinine 1.1 from April 2025, eGFR 54 ml/min. Creat here  is 6.0 in the setting of new onset severe hypercalcemia. UA shows mild protein and wbc's. Renal US  pending. AKI due to severe hypercalcemia + home acei + vol depletion +/- other. Recommend aggressive IVF's and calcitonin, already rec'd zoledronic  acid IV x 1. Ordered myeloma w/u. Pt has mild uremic symptoms, no indication for RRT yet. Will follow.   Volume depletion: bolus 2.5 L and ^NS to 150 cc/hr.  Cirrhosis: by imaging, no real symptoms, labs  H/o COPD HTN       Rob Lyrica Mcclarty  MD [facility] 03/06/2024, 5:15 PM  Recent Labs  Lab 03/04/24 1056 03/06/24 1106  CREATININE 5.15* 6.00*  K 3.7 3.5   Inpatient medications:  feeding supplement  237 mL Oral BID BM   [START ON 03/07/2024] Influenza vac split trivalent PF  0.5 mL Intramuscular Tomorrow-1000    sodium chloride  125 mL/hr at 03/06/24 1258   acetaminophen  **OR** acetaminophen , ondansetron  **OR** ondansetron  (ZOFRAN ) IV

## 2024-03-06 NOTE — H&P (Signed)
 History and Physical    Patient: Marissa Willis FMW:[id number] DOB: 1956-04-05 DOA: 03/06/2024 DOS: the patient was seen and examined on 03/06/2024 PCP: Antoniette Vermell CROME, PA-C  Patient coming from: Home  Chief Complaint:  Chief Complaint  Patient presents with   Abnormal Lab   HPI: Marissa Willis is a 68 y.o. female with medical history significant of asthma with COPD, complex partial seizures disorder, GERD, memory changes, pulmonary nodule, prediabetes, mitral valve regurgitation, nephrolithiasis, history of a retained ureteral stent, cerebral aneurysm repair, hyperlipidemia, OSA on CPAP, stress urinary incontinence, vitamin D  deficiency, diverticulosis, class II obesity, liver cirrhosis who was sent from her PCP to the emergency department due to AKI and hypercalcemia.  The patient takes hydrochlorothiazide , vitamin D  and calcium  supplement.  She has felt very weak recently.  She has had trouble walking from 1 room to the next in her home.  Denied fever, chills, rhinorrhea, sore throat, wheezing or hemoptysis.  Positive palpitations, but no chest pain,  diaphoresis, PND, orthopnea or recent pitting edema of the lower extremities.  No abdominal pain, nausea, emesis, diarrhea, constipation, melena or hematochezia.  No flank pain, dysuria, frequency or hematuria.  No polyuria, polydipsia, polyphagia or blurred vision.   Lab work: CBC showed a white count of 10.4, hemoglobin 10.0 g/dL platelets 723.  Fecal occult blood was positive.  Lipase was 107 units/L.  proBNP 192.0 pg/mL.  CMP showed sodium 137, potassium 3.5, chloride 96 and CO2 26 mmol/L with a normal anion gap.  Glucose 92, BUN 58, creatinine 6.0 and calcium  greater than 15.0 mg/dL.  AST was 86 and ALT 78 units/L, the rest of the LFTs were normal.  Imaging: Portable 1 view chest radiograph with no acute cardiopulmonary process.  Normal mediastinum and cardiac silhouette.  Normal pulmonary vasculature.  CT abdomen/pelvis with contrast  done 2 days ago showed hepatic steatosis and nodular hepatic contour suggesting hepatic cirrhosis.  Sigmoid diverticulosis without inflammation.  Aortic atherosclerosis.   ED course: Initial vital signs were temperature 97.8 F pulse 63, respiration 19, BP 129/73 mmHg and O2 sat 96% on room air.  The patient received 1000 mL of normal saline bolus followed by NS at 125 mL/h.  While in the ED, I started the patient on calcitonin and gave her another 1000 mL liter bolus.  Review of Systems: As mentioned in the history of present illness. All other systems reviewed and are negative. 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   History of kidney stones    History of partial seizures    x2 seizure in 2014  dx complex parital seizures started on keppra -- per pt no seizures since off keppra    Hyperlipidemia    OSA on CPAP    Retained ureteral stent    right side migrated   SUI (stress urinary incontinence, female)    mild   Past Surgical History:  Procedure Laterality Date   ANAL FISSURE REPAIR  1994   BLADDER SUSPENSION  1991   CEREBRAL EMBOLIZATION  2011     at [facility]   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   CYSTOSCOPY WITH URETEROSCOPY AND STENT PLACEMENT Right 12/09/2015   Procedure: CYSTOSCOPY WITH retrograde AND STENT  PLACEMENT;  Surgeon: Garnette Shack, MD;  Location: [facility];  Service: Urology;  Laterality: Right;   CYSTOSCOPY WITH URETEROSCOPY AND STENT PLACEMENT Right 02/23/2016   Procedure: CYSTOSCOPY  AND STENT EXTRACTION;  Surgeon: Garnette Shack, MD;  Location: [facility];  Service: Urology;  Laterality: Right;   IR GENERIC HISTORICAL  01/12/2016   IR URETERAL STENT RIGHT NEW ACCESS W/O SEP  NEPHROSTOMY CATH 01/12/2016 Ami Bellman, DO [facility]   NEPHROLITHOTOMY Right 01/12/2016   Procedure: RIGHT  PERCUTANEOUS NEPHROLITHOTOMY  insertion double j stent flexible cystoscopy  removable right double j stent;  Surgeon: Garnette Shack, MD;  Location: [facility];  Service: Urology;  Laterality: Right;   TUBAL LIGATION  1991   Social History:  reports that she quit smoking about 6 years ago. Her smoking use included cigarettes. She started smoking about 51 years ago. She has a 67.5 pack-year smoking history. She has never used smokeless tobacco. She reports current alcohol use. She reports current drug use. Drug: Marijuana.  Allergies  Allergen Reactions   Atorvastatin      Muscle aches    Crestor  [Rosuvastatin ]     Muscle aches     Family History  Problem Relation Age of Onset   Diabetes Mother    Heart disease Mother    Hypertension Mother    Heart disease Father     Prior to Admission medications   Medication Sig Start Date End Date Taking? Authorizing Provider  Albuterol  Sulfate (PROAIR  RESPICLICK) 108 (90 Base) MCG/ACT AEPB Inhale 2 puffs into the lungs every 6 (six) hours as needed (shortness of breath/wheezing). 09/23/23   Breeback, Jade L, PA-C  Albuterol -Budesonide  (AIRSUPRA ) 90-80 MCG/ACT AERO Inhale 2 puffs into the lungs every 6 (six) hours as needed. 01/06/24   Breeback, Jade L, PA-C  AMBULATORY NON FORMULARY MEDICATION Medication Name: 2025-2026 Covid vaccine- diagnosis: age greater than 65y, COPD, asthma 02/25/24   Willo Mini, NP  fluticasone  (FLONASE ) 50 MCG/ACT nasal spray Place 1 spray into both nostrils daily. 08/28/22   Breeback, Jade L, PA-C  ipratropium-albuterol  (DUONEB) 0.5-2.5 (3) MG/3ML SOLN Take 3 mLs by nebulization every 6 (six) hours as needed. 11/28/22   Breeback, Jade L, PA-C  lisinopril -hydrochlorothiazide  (ZESTORETIC ) 20-25 MG tablet Take 1 tablet by mouth daily. 01/06/24   Breeback, Jade L, PA-C  Multiple Vitamin (MULTIVITAMIN) capsule Take 1 capsule  by mouth daily.    [provider]  Multiple Vitamins-Minerals (ICAPS) TABS Take by mouth.    [provider]  omeprazole  (PRILOSEC) 40 MG capsule Take 1 capsule (40 mg total) by mouth daily. 03/04/24   Breeback, Jade L, PA-C  ZYPITAMAG  4 MG TABS Take 1 tablet (4 mg total) by mouth daily. 09/24/23   Antoniette Vermell CROME, PA-C    Physical Exam: Vitals:   03/06/24 1021  BP: 129/73  Pulse: 63  Resp: 18  Temp: 97.8 F (36.6 C)  TempSrc: Oral  SpO2: 96%   Physical Exam Vitals and nursing note reviewed.  Constitutional:      General: She is awake. She is not in acute distress.    Appearance: She is ill-appearing.  HENT:     Head: Normocephalic.     Nose: No rhinorrhea.     Mouth/Throat:     Mouth: Mucous membranes are moist.  Eyes:     General: No scleral icterus.    Pupils: Pupils are equal, round, and reactive to light.  Neck:     Vascular: No JVD.  Cardiovascular:  Rate and Rhythm: Normal rate and regular rhythm.     Heart sounds: S1 normal and S2 normal.  Pulmonary:     Effort: Pulmonary effort is normal.     Breath sounds: Normal breath sounds. No wheezing, rhonchi or rales.  Abdominal:     General: Bowel sounds are normal. There is no distension.     Palpations: Abdomen is soft.     Tenderness: There is no abdominal tenderness. There is no right CVA tenderness or left CVA tenderness.  Musculoskeletal:     Cervical back: Neck supple.     Right lower leg: No edema.     Left lower leg: No edema.  Skin:    General: Skin is warm and dry.  Neurological:     General: No focal deficit present.     Mental Status: She is alert and oriented to person, place, and time.  Psychiatric:        Mood and Affect: Mood normal.        Behavior: Behavior normal. Behavior is cooperative.     Data Reviewed:  Results are pending, will review when available. 10/21/2023 echocardiogram report. IMPRESSIONS:   1. Left ventricular ejection fraction, by estimation, is 60  to 65%. The  left ventricle has normal function. The left ventricle has no regional  wall motion abnormalities. Left ventricular diastolic parameters were  normal.   2. Right ventricular systolic function is normal. The right ventricular  size is normal.   3. The mitral valve is normal in structure. Trivial mitral valve  regurgitation. No evidence of mitral stenosis.   4. The aortic valve is normal in structure. Aortic valve regurgitation is  not visualized. No aortic stenosis is present.   5. The inferior vena cava is normal in size with greater than 50%  respiratory variability, suggesting right atrial pressure of 3 mmHg.   Comparison(s): Echocardiogram done 09/16/19 showed an EF of 60-65%.   EKG: Vent. rate 55 BPM PR interval 178 ms QRS duration 100 ms QT/QTcB 400/382 ms P-R-T axes 24 7 47 Sinus bradycardia Nonspecific ST and T wave abnormality Abnormal ECG  Assessment and Plan: Principal Problem:   Hypercalcemia Observation/telemetry. Continue IV fluids. Initially order calcitonin. -Per pharmacy guidelines biphosphonate given first -Will give a lower dose of biphosphonate. -Zoledronic  acid 3 mg IVP x 1. Follow CBC, CMP in AM.  Active Problems:   AKI (acute kidney injury) (HCC) Continue IV fluids. Hold ARB/ACE. Hold diuretic. Avoid hypotension. Avoid nephrotoxins. Monitor intake and output. Monitor renal function electrolytes.    Heme positive stool   Chronic blood loss anemia Has lost 4 g in recent months. GI consult requested for a.m.    Mixed hyperlipidemia   Aortic atherosclerosis (HCC) Hold statin for now.    Transaminitis In the setting of:   Hepatic steatosis Monitor hepatic function. Follow-up with GI as an outpatient.    Benign essential hypertension Hold antihypertensives for now.    Hypermagnesemia Continue IV fluids.     Advance Care Planning:   Code Status: Full Code   Consults: Nephrology and gastroenterology.  Family  Communication:    Severity of Illness: The appropriate patient status for this patient is INPATIENT. Inpatient status is judged to be reasonable and necessary in order to provide the required intensity of service to ensure the patient's safety. The patient's presenting symptoms, physical exam findings, and initial radiographic and laboratory data in the context of their chronic comorbidities is felt to place them at high risk for further  clinical deterioration. Furthermore, it is not anticipated that the patient will be medically stable for discharge from the hospital within 2 midnights of admission.   * I certify that at the point of admission it is my clinical judgment that the patient will require inpatient hospital care spanning beyond 2 midnights from the point of admission due to high intensity of service, high risk for further deterioration and high frequency of surveillance required.*  Author: Alm Dorn Castor, MD 03/06/2024 1:02 PM  For on call review [email or web address].   This document was prepared using Dragon voice recognition software and may contain some unintended transcription errors.

## 2024-03-06 NOTE — ED Notes (Signed)
 Pt unable to provide urine at this time

## 2024-03-06 NOTE — Plan of Care (Signed)
  Problem: Education: Goal: Knowledge of General Education information will improve Description: Including pain rating scale, medication(s)/side effects and non-pharmacologic comfort measures Outcome: Progressing   Problem: Health Behavior/Discharge Planning: Goal: Ability to manage health-related needs will improve Outcome: Progressing   Problem: Clinical Measurements: Goal: Will remain free from infection Outcome: Progressing Goal: Diagnostic test results will improve Outcome: Progressing Goal: Cardiovascular complication will be avoided Outcome: Progressing   Problem: Nutrition: Goal: Adequate nutrition will be maintained Outcome: Progressing   Problem: Coping: Goal: Level of anxiety will decrease Outcome: Progressing   Problem: Elimination: Goal: Will not experience complications related to bowel motility Outcome: Progressing   Problem: Pain Managment: Goal: General experience of comfort will improve and/or be controlled Outcome: Progressing   Problem: Safety: Goal: Ability to remain free from injury will improve Outcome: Progressing

## 2024-03-06 NOTE — Progress Notes (Signed)
   03/06/24 2208  BiPAP/CPAP/SIPAP  BiPAP/CPAP/SIPAP Pt Type Adult  BiPAP/CPAP/SIPAP Resmed (home machine and home settings)  Mask Type Nasal mask  Dentures removed? Not applicable  Patient Home Machine Yes  Safety Check Completed by RT for Home Unit Yes, no issues noted  Patient Home Mask Yes  Patient Home Tubing Yes  Device Plugged into RED Power Outlet Yes

## 2024-03-06 NOTE — ED Notes (Signed)
 X ray in room. Unable to obtain labs at this time

## 2024-03-06 NOTE — ED Triage Notes (Signed)
 Patient in today reporting recent discovery by pcp for acute kidney failure from abd lab. Advised on come to ED, reports limited pain, no n/v/d.

## 2024-03-07 ENCOUNTER — Observation Stay (HOSPITAL_COMMUNITY)

## 2024-03-07 DIAGNOSIS — E782 Mixed hyperlipidemia: Secondary | ICD-10-CM | POA: Diagnosis present

## 2024-03-07 DIAGNOSIS — K760 Fatty (change of) liver, not elsewhere classified: Secondary | ICD-10-CM | POA: Diagnosis present

## 2024-03-07 DIAGNOSIS — K5900 Constipation, unspecified: Secondary | ICD-10-CM | POA: Diagnosis present

## 2024-03-07 DIAGNOSIS — F0390 Unspecified dementia without behavioral disturbance: Secondary | ICD-10-CM | POA: Diagnosis present

## 2024-03-07 DIAGNOSIS — Z6841 Body Mass Index (BMI) 40.0 and over, adult: Secondary | ICD-10-CM | POA: Diagnosis not present

## 2024-03-07 DIAGNOSIS — J4489 Other specified chronic obstructive pulmonary disease: Secondary | ICD-10-CM | POA: Diagnosis present

## 2024-03-07 DIAGNOSIS — N1831 Chronic kidney disease, stage 3a: Secondary | ICD-10-CM | POA: Diagnosis present

## 2024-03-07 DIAGNOSIS — E869 Volume depletion, unspecified: Secondary | ICD-10-CM | POA: Diagnosis present

## 2024-03-07 DIAGNOSIS — I700 Atherosclerosis of aorta: Secondary | ICD-10-CM | POA: Diagnosis present

## 2024-03-07 DIAGNOSIS — E042 Nontoxic multinodular goiter: Secondary | ICD-10-CM | POA: Diagnosis present

## 2024-03-07 DIAGNOSIS — J810 Acute pulmonary edema: Secondary | ICD-10-CM | POA: Diagnosis not present

## 2024-03-07 DIAGNOSIS — I340 Nonrheumatic mitral (valve) insufficiency: Secondary | ICD-10-CM | POA: Diagnosis present

## 2024-03-07 DIAGNOSIS — G40209 Localization-related (focal) (partial) symptomatic epilepsy and epileptic syndromes with complex partial seizures, not intractable, without status epilepticus: Secondary | ICD-10-CM | POA: Diagnosis present

## 2024-03-07 DIAGNOSIS — K31811 Angiodysplasia of stomach and duodenum with bleeding: Secondary | ICD-10-CM | POA: Diagnosis not present

## 2024-03-07 DIAGNOSIS — Z23 Encounter for immunization: Secondary | ICD-10-CM | POA: Diagnosis present

## 2024-03-07 DIAGNOSIS — Z87891 Personal history of nicotine dependence: Secondary | ICD-10-CM | POA: Diagnosis not present

## 2024-03-07 DIAGNOSIS — I129 Hypertensive chronic kidney disease with stage 1 through stage 4 chronic kidney disease, or unspecified chronic kidney disease: Secondary | ICD-10-CM | POA: Diagnosis present

## 2024-03-07 DIAGNOSIS — E876 Hypokalemia: Secondary | ICD-10-CM | POA: Diagnosis present

## 2024-03-07 DIAGNOSIS — K7460 Unspecified cirrhosis of liver: Secondary | ICD-10-CM | POA: Diagnosis present

## 2024-03-07 DIAGNOSIS — D62 Acute posthemorrhagic anemia: Secondary | ICD-10-CM | POA: Diagnosis not present

## 2024-03-07 DIAGNOSIS — N179 Acute kidney failure, unspecified: Secondary | ICD-10-CM | POA: Diagnosis present

## 2024-03-07 DIAGNOSIS — J449 Chronic obstructive pulmonary disease, unspecified: Secondary | ICD-10-CM | POA: Diagnosis not present

## 2024-03-07 LAB — CBC
HCT: 26.6 % — ABNORMAL LOW (ref 36.0–46.0)
Hemoglobin: 8.6 g/dL — ABNORMAL LOW (ref 12.0–15.0)
MCH: 32.8 pg (ref 26.0–34.0)
MCHC: 32.3 g/dL (ref 30.0–36.0)
MCV: 101.5 fL — ABNORMAL HIGH (ref 80.0–100.0)
Platelets: 230 K/uL (ref 150–400)
RBC: 2.62 MIL/uL — ABNORMAL LOW (ref 3.87–5.11)
RDW: 12.3 % (ref 11.5–15.5)
WBC: 10.4 K/uL (ref 4.0–10.5)
nRBC: 0 % (ref 0.0–0.2)

## 2024-03-07 LAB — COMPREHENSIVE METABOLIC PANEL WITH GFR
ALT: 61 U/L — ABNORMAL HIGH (ref 0–44)
AST: 66 U/L — ABNORMAL HIGH (ref 15–41)
Albumin: 3.8 g/dL (ref 3.5–5.0)
Alkaline Phosphatase: 84 U/L (ref 38–126)
Anion gap: 12 (ref 5–15)
BUN: 59 mg/dL — ABNORMAL HIGH (ref 8–23)
CO2: 24 mmol/L (ref 22–32)
Calcium: 14.2 mg/dL (ref 8.9–10.3)
Chloride: 108 mmol/L (ref 98–111)
Creatinine, Ser: 5.01 mg/dL — ABNORMAL HIGH (ref 0.44–1.00)
GFR, Estimated: 9 mL/min — ABNORMAL LOW (ref >60)
Glucose, Bld: 86 mg/dL (ref 70–99)
Potassium: 3.2 mmol/L — ABNORMAL LOW (ref 3.5–5.1)
Sodium: 144 mmol/L (ref 135–145)
Total Bilirubin: 0.4 mg/dL (ref 0.0–1.2)
Total Protein: 6.5 g/dL (ref 6.5–8.1)

## 2024-03-07 LAB — CALCIUM, URINE, RANDOM: Calcium, Ur: 10.3 mg/dL

## 2024-03-07 LAB — HIV ANTIBODY (ROUTINE TESTING W REFLEX): HIV Screen 4th Generation wRfx: NONREACTIVE

## 2024-03-07 MED ORDER — LORAZEPAM 2 MG/ML IJ SOLN: 1.0000 mg | INTRAMUSCULAR | Status: AC | PRN

## 2024-03-07 MED ORDER — PANTOPRAZOLE SODIUM 40 MG PO TBEC
40.0000 mg | DELAYED_RELEASE_TABLET | Freq: Two times a day (BID) | ORAL | Status: DC
  Administered 2024-03-07 – 2024-03-14 (×13): 40 mg via ORAL
  Filled 2024-03-07 (×13): qty 1

## 2024-03-07 MED ORDER — THIAMINE MONONITRATE 100 MG PO TABS
100.0000 mg | ORAL_TABLET | Freq: Every day | ORAL | Status: DC
  Administered 2024-03-07 – 2024-03-14 (×7): 100 mg via ORAL
  Filled 2024-03-07 (×7): qty 1

## 2024-03-07 MED ORDER — FLUTICASONE PROPIONATE 50 MCG/ACT NA SUSP
1.0000 | Freq: Every day | NASAL | Status: DC
  Administered 2024-03-07 – 2024-03-14 (×7): 1 via NASAL
  Filled 2024-03-07: qty 16

## 2024-03-07 MED ORDER — IPRATROPIUM-ALBUTEROL 0.5-2.5 (3) MG/3ML IN SOLN
3.0000 mL | Freq: Four times a day (QID) | RESPIRATORY_TRACT | Status: DC | PRN
  Administered 2024-03-09: 3 mL via RESPIRATORY_TRACT
  Filled 2024-03-07: qty 3

## 2024-03-07 MED ORDER — ADULT MULTIVITAMIN W/MINERALS CH
1.0000 | ORAL_TABLET | Freq: Every day | ORAL | Status: DC
  Administered 2024-03-07 – 2024-03-14 (×7): 1 via ORAL
  Filled 2024-03-07 (×7): qty 1

## 2024-03-07 MED ORDER — SIMETHICONE 80 MG PO CHEW
80.0000 mg | CHEWABLE_TABLET | Freq: Four times a day (QID) | ORAL | Status: DC | PRN
  Administered 2024-03-07 – 2024-03-09 (×2): 80 mg via ORAL
  Filled 2024-03-07 (×2): qty 1

## 2024-03-07 MED ORDER — CHLORHEXIDINE GLUCONATE CLOTH 2 % EX PADS
6.0000 | MEDICATED_PAD | Freq: Every day | CUTANEOUS | Status: DC
  Administered 2024-03-07 – 2024-03-14 (×7): 6 via TOPICAL

## 2024-03-07 MED ORDER — LORAZEPAM 1 MG PO TABS
1.0000 mg | ORAL_TABLET | ORAL | Status: AC | PRN
  Administered 2024-03-07 – 2024-03-09 (×3): 1 mg via ORAL
  Filled 2024-03-07 (×3): qty 1

## 2024-03-07 MED ORDER — PANTOPRAZOLE SODIUM 40 MG PO TBEC
40.0000 mg | DELAYED_RELEASE_TABLET | Freq: Every day | ORAL | Status: DC
  Administered 2024-03-07: 40 mg via ORAL
  Filled 2024-03-07: qty 1

## 2024-03-07 MED ORDER — THIAMINE HCL 100 MG/ML IJ SOLN
100.0000 mg | Freq: Every day | INTRAMUSCULAR | Status: DC
  Filled 2024-03-07: qty 2

## 2024-03-07 MED ORDER — FOLIC ACID 1 MG PO TABS
1.0000 mg | ORAL_TABLET | Freq: Every day | ORAL | Status: DC
  Administered 2024-03-07 – 2024-03-14 (×7): 1 mg via ORAL
  Filled 2024-03-07 (×7): qty 1

## 2024-03-07 NOTE — Progress Notes (Signed)
   03/07/24 2012  BiPAP/CPAP/SIPAP  BiPAP/CPAP/SIPAP Pt Type Adult  BiPAP/CPAP/SIPAP Resmed  Mask Type Full face mask  FiO2 (%) 21 %  Patient Home Machine Yes  Safety Check Completed by RT for Home Unit Yes, no issues noted  Patient Home Mask Yes  Patient Home Tubing Yes  Device Plugged into RED Power Outlet Yes

## 2024-03-07 NOTE — Consult Note (Signed)
 Referring Provider: TH Primary Care Physician:  Antoniette Vermell LITTIE DEVONNA Primary Gastroenterologist: [facility]  Reason for Consultation:    HPI: Marissa Willis is a 68 y.o. female with past medical history of cirrhosis of the liver, history of complex partial seizure, dementia, GERD, COPD and other comorbidities mentioned below was advised to come to hospital by primary care physician for abnormal labs mainly AKI, elevated white counts, anemia and elevated lipase.  CT abdomen pelvis with IV contrast 2 days ago showed cirrhosis of the liver and sigmoid diverticulosis without any acute changes.  CT was done for further evaluation of black tarry stool.  H. pylori breath test was negative.  Hemoglobin dropped to 9.9 with previous hemoglobin of 14 few months ago.  Patient having intermittent black stool for last 2 weeks now.  Has been taking intermittent ibuprofen.  Had some nausea but denies any abdominal pain.  He was complaining of fatigue.  Denies any bright red blood per rectum.  Denies any diarrhea.  Occasional constipation.  [facility] showed few small tubular adenomas and hyperplastic polyp and repeat was recommended in 3 years.  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   History of kidney stones    History of partial seizures    x2 seizure in 2014  dx complex parital seizures started on keppra -- per pt no seizures since off keppra    Hyperlipidemia    Memory changes 09/09/2019   OSA on CPAP    Pre-diabetes 09/24/2023   Pulmonary  nodules 11/22/2021   Retained ureteral stent    right side migrated   SUI (stress urinary incontinence, female)    mild   Vitamin D  insufficiency 09/23/2023    Past Surgical History:  Procedure Laterality Date   ANAL FISSURE REPAIR  1994   BLADDER SUSPENSION  1991   CEREBRAL EMBOLIZATION  2011     at [facility]   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   CYSTOSCOPY WITH URETEROSCOPY AND STENT PLACEMENT Right 12/09/2015   Procedure: CYSTOSCOPY WITH retrograde AND STENT PLACEMENT;  Surgeon: Garnette Shack, MD;  Location: [facility];  Service: Urology;  Laterality: Right;   CYSTOSCOPY WITH URETEROSCOPY AND STENT PLACEMENT Right 02/23/2016   Procedure: CYSTOSCOPY  AND STENT EXTRACTION;  Surgeon: Garnette Shack, MD;  Location: [facility];  Service: Urology;  Laterality: Right;   IR GENERIC HISTORICAL  01/12/2016   IR URETERAL STENT RIGHT NEW ACCESS W/O SEP NEPHROSTOMY CATH 01/12/2016 Ami Bellman, DO [facility]   NEPHROLITHOTOMY Right 01/12/2016   Procedure: RIGHT  PERCUTANEOUS NEPHROLITHOTOMY  insertion double j stent flexible cystoscopy  removable right double j stent;  Surgeon: Garnette Shack, MD;  Location: [facility];  Service: Urology;  Laterality: Right;   TUBAL LIGATION  1991    Prior to Admission medications   Medication Sig Start Date End Date Taking? Authorizing Provider  Albuterol  Sulfate (PROAIR  RESPICLICK) 108 (90 Base) MCG/ACT AEPB Inhale 2 puffs into the lungs every 6 (six) hours as needed (shortness of breath/wheezing). 09/23/23  Yes Breeback, Jade  L, PA-C  fluticasone  (FLONASE ) 50 MCG/ACT nasal spray Place 1 spray into both nostrils daily. 08/28/22  Yes Breeback, Jade L, PA-C  lisinopril -hydrochlorothiazide  (ZESTORETIC ) 20-25 MG tablet Take 1 tablet by mouth daily. 01/06/24  Yes Breeback, Jade L, PA-C  Multiple Vitamin (MULTIVITAMIN) capsule Take 1 capsule by mouth daily.   Yes [provider]  ZYPITAMAG  4 MG TABS Take 1 tablet (4 mg total)  by mouth daily. 09/24/23  Yes Breeback, Jade L, PA-C  Albuterol -Budesonide  (AIRSUPRA ) 90-80 MCG/ACT AERO Inhale 2 puffs into the lungs every 6 (six) hours as needed. Patient not taking: Reported on 03/06/2024 01/06/24   Antoniette Vermell CROME, PA-C  ipratropium-albuterol  (DUONEB) 0.5-2.5 (3) MG/3ML SOLN Take 3 mLs by nebulization every 6 (six) hours as needed. Patient not taking: Reported on 03/06/2024 11/28/22   Antoniette Vermell CROME, PA-C  omeprazole  (PRILOSEC) 40 MG capsule Take 1 capsule (40 mg total) by mouth daily. Patient not taking: Reported on 03/06/2024 03/04/24   Antoniette Vermell CROME, PA-C    Scheduled Meds:  calcitonin  4 Units/kg Subcutaneous BID   feeding supplement  237 mL Oral BID BM   fluticasone   1 spray Each Nare Daily   Influenza vac split trivalent PF  0.5 mL Intramuscular Tomorrow-1000   pantoprazole   40 mg Oral Daily   Continuous Infusions:  sodium chloride  150 mL/hr at 03/07/24 0554   PRN Meds:.acetaminophen  **OR** acetaminophen , ipratropium-albuterol , ondansetron  **OR** ondansetron  (ZOFRAN ) IV, simethicone   Allergies as of 03/06/2024 - Review Complete 03/06/2024  Allergen Reaction Noted   Atorvastatin   09/08/2019   Crestor  [rosuvastatin ]  09/08/2019    Family History  Problem Relation Age of Onset   Diabetes Mother    Heart disease Mother    Hypertension Mother    Heart disease Father     Social History   Socioeconomic History   Marital status: Married    Spouse name: Dick   Number of children: 2   Years of education: 14   Highest education level: Associate degree: occupational, Scientist, product/process development, or vocational program  Occupational History   Not on file  Tobacco Use   Smoking status: Former    Current packs/day: 0.00    Average packs/day: 1.5 packs/day for 45.0 years (67.5 ttl pk-yrs)    Types: Cigarettes    Start date: 08/07/1972    Quit date: 08/07/2017    Years since quitting: 6.5   Smokeless tobacco: Never  Vaping Use   Vaping status: Never Used  Substance and  Sexual Activity   Alcohol use: Yes    Comment: 1-2 drinks daily   Drug use: Yes    Types: Marijuana   Sexual activity: Not Currently  Other Topics Concern   Not on file  Social History Narrative   Lives at home with husband Chipper ) and 68 yr old mother.   2 daughters   Pt is Rt handed   Patient has a college education.   Patient drinks a cup of coffee daily and occasionally tea.   Social Drivers of Health   Financial Resource Strain: Medium Risk (01/03/2024)   Overall Financial Resource Strain (CARDIA)    Difficulty of Paying Living Expenses: Somewhat hard  Food Insecurity: No Food Insecurity (03/06/2024)   Hunger Vital Sign    Worried About Running Out of Food in the Last Year: Never true    Ran Out of Food in the Last Year: Never true  Transportation Needs: No Transportation Needs (03/06/2024)   PRAPARE - Administrator, Civil Service (  Medical): No    Lack of Transportation (Non-Medical): No  Physical Activity: Inactive (01/03/2024)   Exercise Vital Sign    Days of Exercise per Week: 0 days    Minutes of Exercise per Session: Not on file  Stress: No Stress Concern Present (01/03/2024)   [employer] of Occupational Health - Occupational Stress Questionnaire    Feeling of Stress: Not at all  Social Connections: Moderately Integrated (03/06/2024)   Social Connection and Isolation Panel    Frequency of Communication with Friends and Family: More than three times a week    Frequency of Social Gatherings with Friends and Family: Once a week    Attends Religious Services: Never    Database administrator or Organizations: No    Attends Engineer, structural: 1 to 4 times per year    Marital Status: Married  Recent Concern: Social Connections - Moderately Isolated (01/03/2024)   Social Connection and Isolation Panel    Frequency of Communication with Friends and Family: More than three times a week    Frequency of Social Gatherings with Friends and Family:  Once a week    Attends Religious Services: Never    Database administrator or Organizations: No    Attends Engineer, structural: Not on file    Marital Status: Married  Catering manager Violence: Not At Risk (03/06/2024)   Humiliation, Afraid, Rape, and Kick questionnaire    Fear of Current or Ex-Partner: No    Emotionally Abused: No    Physically Abused: No    Sexually Abused: No    Review of Systems: All negative except as stated above in HPI.  Physical Exam: Vital signs: Vitals:   03/06/24 2024 03/07/24 0436  BP: 105/73 (!) 130/55  Pulse:  65  Resp:  18  Temp:  98.4 F (36.9 C)  SpO2:  97%   Last BM Date : 03/06/24 General:   Alert,  Well-developed, well-nourished, pleasant and cooperative in NAD Normocephalic, atraumatic Extraocular movement intact No scleral icterus Lungs: No visible respiratory distress Heart:  Regular rate and rhythm; no murmurs, clicks, rubs,  or gallops. Abdomen: Soft, nontender, nondistended, bowel sound present, no peritoneal signs Mood and affect normal Alert and oriented x 3 Rectal:  Deferred  GI:  Lab Results: Recent Labs    03/04/24 1056 03/06/24 1106 03/07/24 0554  WBC 12.2* 10.4 10.4  HGB 9.9* 10.0* 8.6*  HCT 30.1* 30.5* 26.6*  PLT 282 276 230   BMET Recent Labs    03/06/24 1106 03/06/24 2046 03/07/24 0554  NA 137 141 144  K 3.5 3.2* 3.2*  CL 96* 102 108  CO2 26 27 24   GLUCOSE 92 96 86  BUN 58* 65* 59*  CREATININE 6.00* 5.10* 5.01*  CALCIUM  >15.0* >15.0* 14.2*   LFT Recent Labs    03/07/24 0554  PROT 6.5  ALBUMIN  3.8  AST 66*  ALT 61*  ALKPHOS 84  BILITOT 0.4   PT/INR No results for input(s): LABPROT, INR in the last 72 hours.   Studies/Results: US  RENAL Result Date: 03/06/2024 EXAM: US  Retroperitoneum Complete, Renal. CLINICAL HISTORY: 409830 AKI (acute kidney injury) (HCC) 409830; 2665 Hypercalcemia 2665. TECHNIQUE: Real-time ultrasound of the retroperitoneum (complete) with image  documentation. COMPARISON: CT abdomen / pelvis dated 03/04/2024. FINDINGS: RIGHT KIDNEY: Measures 12.7 x 5.5 x 3.9 cm (calculated volume 142 ml). Echogenic renal parenchyma, suggesting medical renal disease. No hydronephrosis, renal stone, or mass visualized. LEFT KIDNEY: Measures 11.2 x 4.8 x 4.3  cm (calculated volume 121 ml). Echogenic renal parenchyma, suggesting medical renal disease. No hydronephrosis, renal stone, or mass visualized. BLADDER: Unremarkable as visualized. IMPRESSION: 1. No acute findings. 2. Suspected chronic medical renal disease. Electronically signed by: Pinkie Pebbles MD 03/06/2024 09:10 PM EDT RP Workstation: [id number]   DG Chest Port 1 View Result Date: 03/06/2024 CLINICAL DATA:  Acute renal failure. EXAM: PORTABLE CHEST 1 VIEW COMPARISON:  09/23/2023 FINDINGS: Normal mediastinum and cardiac silhouette. Normal pulmonary vasculature. No evidence of effusion, infiltrate, or pneumothorax. No acute bony abnormality. IMPRESSION: No acute cardiopulmonary process. Electronically Signed   By: Jackquline Boxer M.D.   On: 03/06/2024 12:21    Impression/Plan: - Intermittent melena for 2 weeks in a patient with history of cirrhosis.  Need to rule out variceal bleeding.  Variceal bleeding is less likely.  She has been taking intermittent NSAIDs and also need to rule out underlying ulcer disease. - Acute blood loss anemia - Acute kidney injury - Hypercalcemia  Recommendations ------------------------ - Increase Protonix  to IV twice daily 40 mg - Plan for EGD tomorrow.  Keep n.p.o. past midnight.  Risks (bleeding, infection, bowel perforation that could require surgery, sedation-related changes in cardiopulmonary systems), benefits (identification and possible treatment of source of symptoms, exclusion of certain causes of symptoms), and alternatives (watchful waiting, radiographic imaging studies, empiric medical treatment)  were explained to patient/family in detail and patient  wishes to proceed.     LOS: 0 days   Layla Lah  MD, FACP 03/07/2024, 10:44 AM  Contact #  [phone]

## 2024-03-07 NOTE — Care Management Obs Status (Signed)
 MEDICARE OBSERVATION STATUS NOTIFICATION   Patient Details  Name: Ercell Perlman MRN: [id number] Date of Birth: November 17, 1955   Medicare Observation Status Notification Given:  Yes    Dayannara Pascal LILLETTE Fenton, LCSW 03/07/2024, 10:35 AM

## 2024-03-07 NOTE — Progress Notes (Signed)
 PROGRESS NOTE    Marissa Willis  FMW:[id number] DOB: 06/17/1956 DOA: 03/06/2024 PCP: Antoniette Vermell CROME, PA-C   Brief Narrative: 68 year old with past medical history significant for asthma, COPD, complex partial seizure disorder, GERD, memory changes, pulmonary nodules, prediabetes, mitral valve regurgitation, nephrolithiasis, history of retained ureteral stents, cerebral aneurysmal repair, hyperlipidemia, OSA on CPAP, stress urinary incontinence, vitamin D  deficiency, class II obesity, liver cirrhosis who was sent from her PCP office due to AKI and hypercalcemia.  Patient takes hydrochlorothiazide  vitamin D  and calcium  supplements.  She has been feeling very weak and having trouble walking from 1 room to another.  Evaluation in the ED hemoglobin 10, lipase 107, proBNP 192, BUN 58, creatinine 6, calcium  greater than 15.  Mild transaminases.  Chest x-ray no acute cardiopulmonary process, CT abdomen and pelvis with contrast done 2 days prior to admission showed hepatic asteatosis and nodular hepatic contour suggesting hepatic cirrhosis diverticulosis.  Patient was started on IV fluids, calcitonin.   Assessment & Plan:   Principal Problem:   Hypercalcemia Active Problems:   Mixed hyperlipidemia   Aortic atherosclerosis (HCC)   Hepatic steatosis   Benign essential hypertension   AKI (acute kidney injury) (HCC)   Hypermagnesemia   Heme positive stool   Transaminitis   Chronic blood loss anemia  1-AKI Patient presented with a creatinine of 6, BUN 60 CK level 323, Renal US :  No acute findings. Suspected chronic medical renal disease. Continue with IV fluids.   2-Hypercalcemia: Vitamin D  53 Continue calcitonin Continue with IV fluids Received a dose of zoledronic  acid Could be related to AKI will need to rule out hypercalcemia of malignancy She denies history of malignancy.  MM panel pending. Will order bone survey    Acute on chronic blood loss anemia Heme Positive stool -GI  consulted.  -plan for endoscopy tomorrow, ruled out varices bleed, PUD.  -IV Protonix  IV BID.  Monitor hb   Hyperlipidemia - Holding statin for now  Transaminases Hepatic steatosis, cirrhosis: - Continue to monitor liver function test, - Cirrhosis by imaging  Hypertension - Holding hydrochlorothiazide  and lisinopril  in the setting of AKI and hypercalcemia  Hypomagnesemia Monitor, on IV fluid  Asthma/COPD: Continue with nebulizer Alcohol use;  Will monitor on CIWA  Estimated body mass index is 38.41 kg/m as calculated from the following:   Height as of 03/04/24: 5' 2 (1.575 m).   Weight as of 03/04/24: 95.3 kg.   DVT prophylaxis: SCDs Code Status: Full code Family Communication: Husband who was at bedside Disposition Plan:  Status is: Observation The patient will require care spanning > 2 midnights and should be moved to inpatient because: Management of hypercalcemia and AKI    Consultants:  Nephrology GI  Procedures:  Renal ultrasound  Antimicrobials:    Subjective: She is alert and conversant, she reports some cramping abdominal pain, having dark stool.   Objective: Vitals:   03/06/24 1021 03/06/24 1417 03/06/24 2024 03/07/24 0436  BP: 129/73 (!) 146/53 105/73 (!) 130/55  Pulse: 63 (!) 58  65  Resp: 18   18  Temp: 97.8 F (36.6 C) 99 F (37.2 C)  98.4 F (36.9 C)  TempSrc: Oral Oral  Oral  SpO2: 96% 96%  97%    Intake/Output Summary (Last 24 hours) at 03/07/2024 0718 Last data filed at 03/07/2024 0640 Gross per 24 hour  Intake 4005.08 ml  Output 1400 ml  Net 2605.08 ml   There were no vitals filed for this visit.  Examination:  General exam: Appears  calm and comfortable  Respiratory system: Clear to auscultation. Respiratory effort normal. Cardiovascular system: S1 & S2 heard, RRR. No JVD, murmurs, rubs, gallops or clicks. No pedal edema. Gastrointestinal system: Abdomen is nondistended, soft and nontender. No organomegaly or masses felt.  Normal bowel sounds heard. Central nervous system: Alert and oriented. No focal neurological deficits. Extremities: Symmetric 5 x 5 power.    Data Reviewed: I have personally reviewed following labs and imaging studies  CBC: Recent Labs  Lab 03/04/24 1056 03/06/24 1106 03/07/24 0554  WBC 12.2* 10.4 10.4  NEUTROABS 6.7 6.0  --   HGB 9.9* 10.0* 8.6*  HCT 30.1* 30.5* 26.6*  MCV 101* 98.7 101.5*  PLT 282 276 230   Basic Metabolic Panel: Recent Labs  Lab 03/04/24 1056 03/06/24 1106 03/06/24 2046  NA 136 137 141  K 3.7 3.5 3.2*  CL 94* 96* 102  CO2 26 26 27   GLUCOSE 101* 92 96  BUN 61* 58* 65*  CREATININE 5.15* 6.00* 5.10*  CALCIUM  17.0* >15.0* >15.0*  MG  --  4.3*  --   PHOS  --  3.4  --    GFR: Estimated Creatinine Clearance: 11.1 mL/min (A) (by C-G formula based on SCr of 5.1 mg/dL (H)). Liver Function Tests: Recent Labs  Lab 03/04/24 1056 03/06/24 1106 03/06/24 2046  AST 72* 86*  --   ALT 62* 78*  --   ALKPHOS 78 82  --   BILITOT 0.4 0.3  --   PROT 7.5 7.9  --   ALBUMIN  4.5 4.5 3.9   Recent Labs  Lab 03/04/24 1056 03/06/24 1106  LIPASE 113* 107*   No results for input(s): AMMONIA in the last 168 hours. Coagulation Profile: No results for input(s): INR, PROTIME in the last 168 hours. Cardiac Enzymes: Recent Labs  Lab 03/06/24 1106  CKTOTAL 323*   BNP (last 3 results) Recent Labs    03/06/24 1106  PROBNP 192.0   HbA1C: No results for input(s): HGBA1C in the last 72 hours. CBG: No results for input(s): GLUCAP in the last 168 hours. Lipid Profile: No results for input(s): CHOL, HDL, LDLCALC, TRIG, CHOLHDL, LDLDIRECT in the last 72 hours. Thyroid  Function Tests: Recent Labs    03/04/24 1056  TSH 2.510  FREET4 0.92   Anemia Panel: Recent Labs    03/04/24 1056  FERRITIN 182*  TIBC 414  IRON 104   Sepsis Labs: No results for input(s): PROCALCITON, LATICACIDVEN in the last 168 hours.  Recent Results (from  the past 240 hours)  Urine Culture     Status: None   Collection Time: 03/04/24 11:56 AM   Specimen: Urine   Urine  Result Value Ref Range Status   Urine Culture, Routine Final report  Final   Organism ID, Bacteria No growth  Final         Radiology Studies: US  RENAL Result Date: 03/06/2024 EXAM: US  Retroperitoneum Complete, Renal. CLINICAL HISTORY: 409830 AKI (acute kidney injury) (HCC) 409830; 2665 Hypercalcemia 2665. TECHNIQUE: Real-time ultrasound of the retroperitoneum (complete) with image documentation. COMPARISON: CT abdomen / pelvis dated 03/04/2024. FINDINGS: RIGHT KIDNEY: Measures 12.7 x 5.5 x 3.9 cm (calculated volume 142 ml). Echogenic renal parenchyma, suggesting medical renal disease. No hydronephrosis, renal stone, or mass visualized. LEFT KIDNEY: Measures 11.2 x 4.8 x 4.3 cm (calculated volume 121 ml). Echogenic renal parenchyma, suggesting medical renal disease. No hydronephrosis, renal stone, or mass visualized. BLADDER: Unremarkable as visualized. IMPRESSION: 1. No acute findings. 2. Suspected chronic medical renal disease. Electronically  signed by: Pinkie Pebbles MD 03/06/2024 09:10 PM EDT RP Workstation: [id number]   DG Chest Port 1 View Result Date: 03/06/2024 CLINICAL DATA:  Acute renal failure. EXAM: PORTABLE CHEST 1 VIEW COMPARISON:  09/23/2023 FINDINGS: Normal mediastinum and cardiac silhouette. Normal pulmonary vasculature. No evidence of effusion, infiltrate, or pneumothorax. No acute bony abnormality. IMPRESSION: No acute cardiopulmonary process. Electronically Signed   By: Jackquline Boxer M.D.   On: 03/06/2024 12:21        Scheduled Meds:  calcitonin  4 Units/kg Subcutaneous BID   feeding supplement  237 mL Oral BID BM   Influenza vac split trivalent PF  0.5 mL Intramuscular Tomorrow-1000   Continuous Infusions:  sodium chloride  150 mL/hr at 03/07/24 0554     LOS: 0 days    Time spent: 35 Minutes    Marcin Holte A Emanuell Morina, MD [facility]   If 7PM-7AM, please contact night-coverage [email or web address]  03/07/2024, 7:18 AM

## 2024-03-07 NOTE — Progress Notes (Signed)
 [facility] Progress Note  Subjective:  Seen in room, remains fatigued No n/v or confusion I/O yest 4000 ml in, and 0.4 UOP Today 1000 cc UOP so far Creat 14.2, creat 5.01, bun 59  Vitals:   03/06/24 1021 03/06/24 1417 03/06/24 2024 03/07/24 0436  BP: 129/73 (!) 146/53 105/73 (!) 130/55  Pulse: 63 (!) 58  65  Resp: 18   18  Temp: 97.8 F (36.6 C) 99 F (37.2 C)  98.4 F (36.9 C)  TempSrc: Oral Oral  Oral  SpO2: 96% 96%  97%    Exam: Gen alert, no distress Sclera anicteric, throat slightly dry No jvd or bruits Chest clear bilat to bases RRR no MRG Abd soft ntnd no mass or ascites +bs Ext no LE or UE edema, no other edema Neuro is alert, Ox 3 , nf     Home meds: Albuterol  Airsupra  Duoneb Lisinopril -hydrochlorothiazide  20-25 every day MVI Prilosec 40 every day     Date                            Creat               eGFR (ml/min) 2014- feb 2017    0.80- 0.95   11/2015    1.23- 1.40 12/2015- 11/2022 0.72- 1.04 55- > 60 ml/min 09/24/23                          1.10                54 ml/min 03/04/24                        5.15                 8 03/06/24                        6.00                 7                 UA trace LE, prot 100, 0-5 rbc/ epi, 6-10 wbc's UNa 37, UCr 78 UP/C ratio = 0.54 Renal US : 12.7/ 11.2 cm kidneys, no hydro, bilat ^echo  CXR - no active disease PTH - pending Vit D - 53    Assessment/ Plan: AKI: b/l creatinine 1.1 from April 2025, eGFR 54 ml/min. Creat here is 6.0 in the setting of new onset severe hypercalcemia. UA shows mild protein and wbc's. Renal US  w/o obstruction. AKI due to severe hypercalcemia + home acei + vol depletion +/- other. Pt is getting IVF's 150 cc/hr and tolerating well, no signs of vol overload. Cont IVFs (^200 cc/hr) and calcitonin. Already rec'd zoledronic  acid IV x 1. Ordered myeloma w/u, no indication for RRT yet. Creat down slightly. Will follow.  Hypercalcemia: calcium  was > 15 on admission, today  is down to 14.2. Getting IVFs and is making urine. Also got zometa , and is on calcitonin.  Volume depletion: improving on exam Cirrhosis: by imaging, no real symptoms, labs  H/o COPD HTN    Rob Ellowyn Rieves MD  [facility] 03/07/2024, 12:01 PM  Recent Labs  Lab 03/06/24 1106 03/06/24 2046 03/07/24 0554  HGB 10.0*  --  8.6*  ALBUMIN  4.5 3.9 3.8  CALCIUM  >15.0* >15.0* 14.2*  PHOS 3.4  --   --  CREATININE 6.00* 5.10* 5.01*  K 3.5 3.2* 3.2*   Recent Labs  Lab 03/04/24 1056  IRON 104  TIBC 414  FERRITIN 182*   Inpatient medications:  calcitonin  4 Units/kg Subcutaneous BID   Chlorhexidine  Gluconate Cloth  6 each Topical Daily   feeding supplement  237 mL Oral BID BM   fluticasone   1 spray Each Nare Daily   Influenza vac split trivalent PF  0.5 mL Intramuscular Tomorrow-1000   pantoprazole   40 mg Oral Daily    sodium chloride  150 mL/hr at 03/07/24 0554   acetaminophen  **OR** acetaminophen , ipratropium-albuterol , ondansetron  **OR** ondansetron  (ZOFRAN ) IV, simethicone 

## 2024-03-07 NOTE — H&P (View-Only) (Signed)
 Referring Provider: TH Primary Care Physician:  Antoniette Vermell LITTIE DEVONNA Primary Gastroenterologist: [facility]  Reason for Consultation:    HPI: Marissa Willis is a 68 y.o. female with past medical history of cirrhosis of the liver, history of complex partial seizure, dementia, GERD, COPD and other comorbidities mentioned below was advised to come to hospital by primary care physician for abnormal labs mainly AKI, elevated white counts, anemia and elevated lipase.  CT abdomen pelvis with IV contrast 2 days ago showed cirrhosis of the liver and sigmoid diverticulosis without any acute changes.  CT was done for further evaluation of black tarry stool.  H. pylori breath test was negative.  Hemoglobin dropped to 9.9 with previous hemoglobin of 14 few months ago.  Patient having intermittent black stool for last 2 weeks now.  Has been taking intermittent ibuprofen.  Had some nausea but denies any abdominal pain.  He was complaining of fatigue.  Denies any bright red blood per rectum.  Denies any diarrhea.  Occasional constipation.  [facility] showed few small tubular adenomas and hyperplastic polyp and repeat was recommended in 3 years.  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   History of kidney stones    History of partial seizures    x2 seizure in 2014  dx complex parital seizures started on keppra -- per pt no seizures since off keppra    Hyperlipidemia    Memory changes 09/09/2019   OSA on CPAP    Pre-diabetes 09/24/2023   Pulmonary  nodules 11/22/2021   Retained ureteral stent    right side migrated   SUI (stress urinary incontinence, female)    mild   Vitamin D  insufficiency 09/23/2023    Past Surgical History:  Procedure Laterality Date   ANAL FISSURE REPAIR  1994   BLADDER SUSPENSION  1991   CEREBRAL EMBOLIZATION  2011     at [facility]   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   CYSTOSCOPY WITH URETEROSCOPY AND STENT PLACEMENT Right 12/09/2015   Procedure: CYSTOSCOPY WITH retrograde AND STENT PLACEMENT;  Surgeon: Garnette Shack, MD;  Location: [facility];  Service: Urology;  Laterality: Right;   CYSTOSCOPY WITH URETEROSCOPY AND STENT PLACEMENT Right 02/23/2016   Procedure: CYSTOSCOPY  AND STENT EXTRACTION;  Surgeon: Garnette Shack, MD;  Location: [facility];  Service: Urology;  Laterality: Right;   IR GENERIC HISTORICAL  01/12/2016   IR URETERAL STENT RIGHT NEW ACCESS W/O SEP NEPHROSTOMY CATH 01/12/2016 Ami Bellman, DO [facility]   NEPHROLITHOTOMY Right 01/12/2016   Procedure: RIGHT  PERCUTANEOUS NEPHROLITHOTOMY  insertion double j stent flexible cystoscopy  removable right double j stent;  Surgeon: Garnette Shack, MD;  Location: [facility];  Service: Urology;  Laterality: Right;   TUBAL LIGATION  1991    Prior to Admission medications   Medication Sig Start Date End Date Taking? Authorizing Provider  Albuterol  Sulfate (PROAIR  RESPICLICK) 108 (90 Base) MCG/ACT AEPB Inhale 2 puffs into the lungs every 6 (six) hours as needed (shortness of breath/wheezing). 09/23/23  Yes Breeback, Jade  L, PA-C  fluticasone  (FLONASE ) 50 MCG/ACT nasal spray Place 1 spray into both nostrils daily. 08/28/22  Yes Breeback, Jade L, PA-C  lisinopril -hydrochlorothiazide  (ZESTORETIC ) 20-25 MG tablet Take 1 tablet by mouth daily. 01/06/24  Yes Breeback, Jade L, PA-C  Multiple Vitamin (MULTIVITAMIN) capsule Take 1 capsule by mouth daily.   Yes [provider]  ZYPITAMAG  4 MG TABS Take 1 tablet (4 mg total)  by mouth daily. 09/24/23  Yes Breeback, Jade L, PA-C  Albuterol -Budesonide  (AIRSUPRA ) 90-80 MCG/ACT AERO Inhale 2 puffs into the lungs every 6 (six) hours as needed. Patient not taking: Reported on 03/06/2024 01/06/24   Antoniette Vermell CROME, PA-C  ipratropium-albuterol  (DUONEB) 0.5-2.5 (3) MG/3ML SOLN Take 3 mLs by nebulization every 6 (six) hours as needed. Patient not taking: Reported on 03/06/2024 11/28/22   Antoniette Vermell CROME, PA-C  omeprazole  (PRILOSEC) 40 MG capsule Take 1 capsule (40 mg total) by mouth daily. Patient not taking: Reported on 03/06/2024 03/04/24   Antoniette Vermell CROME, PA-C    Scheduled Meds:  calcitonin  4 Units/kg Subcutaneous BID   feeding supplement  237 mL Oral BID BM   fluticasone   1 spray Each Nare Daily   Influenza vac split trivalent PF  0.5 mL Intramuscular Tomorrow-1000   pantoprazole   40 mg Oral Daily   Continuous Infusions:  sodium chloride  150 mL/hr at 03/07/24 0554   PRN Meds:.acetaminophen  **OR** acetaminophen , ipratropium-albuterol , ondansetron  **OR** ondansetron  (ZOFRAN ) IV, simethicone   Allergies as of 03/06/2024 - Review Complete 03/06/2024  Allergen Reaction Noted   Atorvastatin   09/08/2019   Crestor  [rosuvastatin ]  09/08/2019    Family History  Problem Relation Age of Onset   Diabetes Mother    Heart disease Mother    Hypertension Mother    Heart disease Father     Social History   Socioeconomic History   Marital status: Married    Spouse name: Dick   Number of children: 2   Years of education: 14   Highest education level: Associate degree: occupational, Scientist, product/process development, or vocational program  Occupational History   Not on file  Tobacco Use   Smoking status: Former    Current packs/day: 0.00    Average packs/day: 1.5 packs/day for 45.0 years (67.5 ttl pk-yrs)    Types: Cigarettes    Start date: 08/07/1972    Quit date: 08/07/2017    Years since quitting: 6.5   Smokeless tobacco: Never  Vaping Use   Vaping status: Never Used  Substance and  Sexual Activity   Alcohol use: Yes    Comment: 1-2 drinks daily   Drug use: Yes    Types: Marijuana   Sexual activity: Not Currently  Other Topics Concern   Not on file  Social History Narrative   Lives at home with husband Chipper ) and 68 yr old mother.   2 daughters   Pt is Rt handed   Patient has a college education.   Patient drinks a cup of coffee daily and occasionally tea.   Social Drivers of Health   Financial Resource Strain: Medium Risk (01/03/2024)   Overall Financial Resource Strain (CARDIA)    Difficulty of Paying Living Expenses: Somewhat hard  Food Insecurity: No Food Insecurity (03/06/2024)   Hunger Vital Sign    Worried About Running Out of Food in the Last Year: Never true    Ran Out of Food in the Last Year: Never true  Transportation Needs: No Transportation Needs (03/06/2024)   PRAPARE - Administrator, Civil Service (  Medical): No    Lack of Transportation (Non-Medical): No  Physical Activity: Inactive (01/03/2024)   Exercise Vital Sign    Days of Exercise per Week: 0 days    Minutes of Exercise per Session: Not on file  Stress: No Stress Concern Present (01/03/2024)   [employer] of Occupational Health - Occupational Stress Questionnaire    Feeling of Stress: Not at all  Social Connections: Moderately Integrated (03/06/2024)   Social Connection and Isolation Panel    Frequency of Communication with Friends and Family: More than three times a week    Frequency of Social Gatherings with Friends and Family: Once a week    Attends Religious Services: Never    Database administrator or Organizations: No    Attends Engineer, structural: 1 to 4 times per year    Marital Status: Married  Recent Concern: Social Connections - Moderately Isolated (01/03/2024)   Social Connection and Isolation Panel    Frequency of Communication with Friends and Family: More than three times a week    Frequency of Social Gatherings with Friends and Family:  Once a week    Attends Religious Services: Never    Database administrator or Organizations: No    Attends Engineer, structural: Not on file    Marital Status: Married  Catering manager Violence: Not At Risk (03/06/2024)   Humiliation, Afraid, Rape, and Kick questionnaire    Fear of Current or Ex-Partner: No    Emotionally Abused: No    Physically Abused: No    Sexually Abused: No    Review of Systems: All negative except as stated above in HPI.  Physical Exam: Vital signs: Vitals:   03/06/24 2024 03/07/24 0436  BP: 105/73 (!) 130/55  Pulse:  65  Resp:  18  Temp:  98.4 F (36.9 C)  SpO2:  97%   Last BM Date : 03/06/24 General:   Alert,  Well-developed, well-nourished, pleasant and cooperative in NAD Normocephalic, atraumatic Extraocular movement intact No scleral icterus Lungs: No visible respiratory distress Heart:  Regular rate and rhythm; no murmurs, clicks, rubs,  or gallops. Abdomen: Soft, nontender, nondistended, bowel sound present, no peritoneal signs Mood and affect normal Alert and oriented x 3 Rectal:  Deferred  GI:  Lab Results: Recent Labs    03/04/24 1056 03/06/24 1106 03/07/24 0554  WBC 12.2* 10.4 10.4  HGB 9.9* 10.0* 8.6*  HCT 30.1* 30.5* 26.6*  PLT 282 276 230   BMET Recent Labs    03/06/24 1106 03/06/24 2046 03/07/24 0554  NA 137 141 144  K 3.5 3.2* 3.2*  CL 96* 102 108  CO2 26 27 24   GLUCOSE 92 96 86  BUN 58* 65* 59*  CREATININE 6.00* 5.10* 5.01*  CALCIUM  >15.0* >15.0* 14.2*   LFT Recent Labs    03/07/24 0554  PROT 6.5  ALBUMIN  3.8  AST 66*  ALT 61*  ALKPHOS 84  BILITOT 0.4   PT/INR No results for input(s): LABPROT, INR in the last 72 hours.   Studies/Results: US  RENAL Result Date: 03/06/2024 EXAM: US  Retroperitoneum Complete, Renal. CLINICAL HISTORY: 409830 AKI (acute kidney injury) (HCC) 409830; 2665 Hypercalcemia 2665. TECHNIQUE: Real-time ultrasound of the retroperitoneum (complete) with image  documentation. COMPARISON: CT abdomen / pelvis dated 03/04/2024. FINDINGS: RIGHT KIDNEY: Measures 12.7 x 5.5 x 3.9 cm (calculated volume 142 ml). Echogenic renal parenchyma, suggesting medical renal disease. No hydronephrosis, renal stone, or mass visualized. LEFT KIDNEY: Measures 11.2 x 4.8 x 4.3  cm (calculated volume 121 ml). Echogenic renal parenchyma, suggesting medical renal disease. No hydronephrosis, renal stone, or mass visualized. BLADDER: Unremarkable as visualized. IMPRESSION: 1. No acute findings. 2. Suspected chronic medical renal disease. Electronically signed by: Pinkie Pebbles MD 03/06/2024 09:10 PM EDT RP Workstation: [id number]   DG Chest Port 1 View Result Date: 03/06/2024 CLINICAL DATA:  Acute renal failure. EXAM: PORTABLE CHEST 1 VIEW COMPARISON:  09/23/2023 FINDINGS: Normal mediastinum and cardiac silhouette. Normal pulmonary vasculature. No evidence of effusion, infiltrate, or pneumothorax. No acute bony abnormality. IMPRESSION: No acute cardiopulmonary process. Electronically Signed   By: Jackquline Boxer M.D.   On: 03/06/2024 12:21    Impression/Plan: - Intermittent melena for 2 weeks in a patient with history of cirrhosis.  Need to rule out variceal bleeding.  Variceal bleeding is less likely.  She has been taking intermittent NSAIDs and also need to rule out underlying ulcer disease. - Acute blood loss anemia - Acute kidney injury - Hypercalcemia  Recommendations ------------------------ - Increase Protonix  to IV twice daily 40 mg - Plan for EGD tomorrow.  Keep n.p.o. past midnight.  Risks (bleeding, infection, bowel perforation that could require surgery, sedation-related changes in cardiopulmonary systems), benefits (identification and possible treatment of source of symptoms, exclusion of certain causes of symptoms), and alternatives (watchful waiting, radiographic imaging studies, empiric medical treatment)  were explained to patient/family in detail and patient  wishes to proceed.     LOS: 0 days   Layla Lah  MD, FACP 03/07/2024, 10:44 AM  Contact #  [phone]

## 2024-03-07 NOTE — Plan of Care (Signed)
  Problem: Clinical Measurements: Goal: Ability to maintain clinical measurements within normal limits will improve Outcome: Progressing Goal: Diagnostic test results will improve Outcome: Progressing Goal: Respiratory complications will improve Outcome: Progressing   Problem: Activity: Goal: Risk for activity intolerance will decrease Outcome: Progressing   Problem: Nutrition: Goal: Adequate nutrition will be maintained Outcome: Progressing   Problem: Coping: Goal: Level of anxiety will decrease Outcome: Progressing   Problem: Elimination: Goal: Will not experience complications related to bowel motility Outcome: Progressing Goal: Will not experience complications related to urinary retention Outcome: Progressing   Problem: Safety: Goal: Ability to remain free from injury will improve Outcome: Progressing   Problem: Skin Integrity: Goal: Risk for impaired skin integrity will decrease Outcome: Progressing

## 2024-03-08 ENCOUNTER — Inpatient Hospital Stay (HOSPITAL_COMMUNITY): Admitting: Anesthesiology

## 2024-03-08 ENCOUNTER — Encounter (HOSPITAL_COMMUNITY)
Admission: EM | Disposition: A | Discharge status: Home / Self Care | Payer: Self-pay | Source: Ambulatory Visit | Attending: Internal Medicine

## 2024-03-08 ENCOUNTER — Encounter (HOSPITAL_COMMUNITY): Payer: Self-pay | Admitting: *Deleted

## 2024-03-08 DIAGNOSIS — Z87891 Personal history of nicotine dependence: Secondary | ICD-10-CM | POA: Diagnosis not present

## 2024-03-08 DIAGNOSIS — I700 Atherosclerosis of aorta: Secondary | ICD-10-CM

## 2024-03-08 DIAGNOSIS — K31811 Angiodysplasia of stomach and duodenum with bleeding: Secondary | ICD-10-CM | POA: Diagnosis not present

## 2024-03-08 DIAGNOSIS — J449 Chronic obstructive pulmonary disease, unspecified: Secondary | ICD-10-CM

## 2024-03-08 HISTORY — PX: ESOPHAGOGASTRODUODENOSCOPY: SHX5428

## 2024-03-08 LAB — CBC
HCT: 25.9 % — ABNORMAL LOW (ref 36.0–46.0)
Hemoglobin: 8 g/dL — ABNORMAL LOW (ref 12.0–15.0)
MCH: 32.4 pg (ref 26.0–34.0)
MCHC: 30.9 g/dL (ref 30.0–36.0)
MCV: 104.9 fL — ABNORMAL HIGH (ref 80.0–100.0)
Platelets: 205 K/uL (ref 150–400)
RBC: 2.47 MIL/uL — ABNORMAL LOW (ref 3.87–5.11)
RDW: 12.9 % (ref 11.5–15.5)
WBC: 9.9 K/uL (ref 4.0–10.5)
nRBC: 0 % (ref 0.0–0.2)

## 2024-03-08 LAB — RENAL FUNCTION PANEL
Albumin: 3.7 g/dL (ref 3.5–5.0)
Anion gap: 12 (ref 5–15)
BUN: 43 mg/dL — ABNORMAL HIGH (ref 8–23)
CO2: 22 mmol/L (ref 22–32)
Calcium: 13.1 mg/dL (ref 8.9–10.3)
Chloride: 110 mmol/L (ref 98–111)
Creatinine, Ser: 4.07 mg/dL — ABNORMAL HIGH (ref 0.44–1.00)
GFR, Estimated: 11 mL/min — ABNORMAL LOW (ref >60)
Glucose, Bld: 99 mg/dL (ref 70–99)
Phosphorus: 2.7 mg/dL (ref 2.5–4.6)
Potassium: 3.5 mmol/L (ref 3.5–5.1)
Sodium: 144 mmol/L (ref 135–145)

## 2024-03-08 LAB — PARATHYROID HORMONE, INTACT (NO CA): PTH: 31 pg/mL (ref 15–65)

## 2024-03-08 SURGERY — EGD (ESOPHAGOGASTRODUODENOSCOPY)
Anesthesia: Monitor Anesthesia Care

## 2024-03-08 MED ORDER — LACTATED RINGERS IV SOLN: INTRAVENOUS | Status: DC | PRN

## 2024-03-08 MED ORDER — PROPOFOL 10 MG/ML IV BOLUS
INTRAVENOUS | Status: DC | PRN
  Administered 2024-03-08: 30 mg via INTRAVENOUS
  Administered 2024-03-08: 20 mg via INTRAVENOUS

## 2024-03-08 MED ORDER — PROPOFOL 500 MG/50ML IV EMUL
INTRAVENOUS | Status: DC | PRN
  Administered 2024-03-08: 125 ug/kg/min via INTRAVENOUS

## 2024-03-08 MED ORDER — CALCITONIN (SALMON) 200 UNIT/ML IJ SOLN
400.0000 [IU] | Freq: Two times a day (BID) | INTRAMUSCULAR | Status: AC
  Administered 2024-03-09 (×2): 400 [IU] via SUBCUTANEOUS
  Filled 2024-03-08 (×3): qty 2

## 2024-03-08 NOTE — Progress Notes (Signed)
 PROGRESS NOTE    Marissa Willis  FMW:[id number] DOB: [birth year] DOA: 03/06/2024 PCP: Antoniette Vermell CROME, PA-C   Brief Narrative: 68 year old with past medical history significant for asthma, COPD, complex partial seizure disorder, GERD, memory changes, pulmonary nodules, prediabetes, mitral valve regurgitation, nephrolithiasis, history of retained ureteral stents, cerebral aneurysmal repair, hyperlipidemia, OSA on CPAP, stress urinary incontinence, vitamin D  deficiency, class II obesity, liver cirrhosis who was sent from her PCP office due to AKI and hypercalcemia.  Patient takes hydrochlorothiazide  vitamin D  and calcium  supplements.  She has been feeling very weak and having trouble walking from 1 room to another.  Evaluation in the ED hemoglobin 10, lipase 107, proBNP 192, BUN 58, creatinine 6, calcium  greater than 15.  Mild transaminases.  Chest x-ray no acute cardiopulmonary process, CT abdomen and pelvis with contrast done 2 days prior to admission showed hepatic asteatosis and nodular hepatic contour suggesting hepatic cirrhosis diverticulosis.  Patient was started on IV fluids, calcitonin.   Assessment & Plan:   Principal Problem:   Hypercalcemia Active Problems:   Mixed hyperlipidemia   Aortic atherosclerosis (HCC)   Hepatic steatosis   Benign essential hypertension   AKI (acute kidney injury) (HCC)   Hypermagnesemia   Heme positive stool   Transaminitis   Chronic blood loss anemia  1-AKI Patient presented with a creatinine of 6, BUN 60 CK level 323, Renal US :  No acute findings. Suspected chronic medical renal disease. Continue with IV fluids.  MM panel pending.  Bone survey negative.   2-Hypercalcemia: Vitamin D  53 Continue calcitonin for another 2 doses.  Continue with IV fluids Received a dose of zoledronic  acid Could be related to AKI will need to rule out hypercalcemia of malignancy She denies history of malignancy.  MM panel pending.  bone survey negative  for lytic  lesions.    Acute on chronic blood loss anemia Heme Positive stool -GI consulted.  - Underwent endoscopy on 9/21: Showed erythematous mucosa in the stomach, a single nonbleeding angioectasia in the stomach treated with argon plasma coagulation, multiple recent bleeding and he will ectasis in the duodenum treated with argon plasma. -IV Protonix  IV BID.  Monitor hb  Plan  to monitor on soft diet.  Hyperlipidemia - Holding statin for now  Transaminases Hepatic steatosis, cirrhosis: - Continue to monitor liver function test, - Cirrhosis by imaging  Hypertension - Holding hydrochlorothiazide  and lisinopril  in the setting of AKI and hypercalcemia  Hypomagnesemia Monitor, on IV fluid  Asthma/COPD: Continue with nebulizer Alcohol use;  Will monitor on CIWA  Estimated body mass index is 41.05 kg/m as calculated from the following:   Height as of 03/04/24: 5' 2 (1.575 m).   Weight as of this encounter: 101.8 kg.   DVT prophylaxis: SCDs Code Status: Full code Family Communication: Husband who was at bedside Disposition Plan:  Status is: Observation The patient will require care spanning > 2 midnights and should be moved to inpatient because: Management of hypercalcemia and AKI    Consultants:  Nephrology GI  Procedures:  Renal ultrasound  Antimicrobials:    Subjective: She denies any worsening abdominal pain, she is feeling okay.  Denies dyspnea  Objective: Vitals:   03/07/24 1713 03/07/24 2045 03/08/24 0443 03/08/24 0500  BP: (!) 152/47 (!) 151/55 (!) 137/50   Pulse: 78 82 75   Resp:  18 18   Temp:  98.1 F (36.7 C) 99.5 F (37.5 C)   TempSrc:  Oral Oral   SpO2: 91% 92% 92%   Weight:  101.8 kg    Intake/Output Summary (Last 24 hours) at 03/08/2024 0740 Last data filed at 03/08/2024 0420 Gross per 24 hour  Intake 1727.46 ml  Output 1751 ml  Net -23.54 ml   Filed Weights   03/08/24 0500  Weight: 101.8 kg    Examination:  General exam:  MAD Respiratory system: Normal respiratory effort clear to auscultation Cardiovascular system: S1-S2 regular rhythm and rate Gastrointestinal system: Bowel sounds present, soft nontender nondistended Central nervous system: Alert and oriented Extremities: Symmetric 5 x 5 power.    Data Reviewed: I have personally reviewed following labs and imaging studies  CBC: Recent Labs  Lab 03/04/24 1056 03/06/24 1106 03/07/24 0554 03/08/24 0636  WBC 12.2* 10.4 10.4 9.9  NEUTROABS 6.7 6.0  --   --   HGB 9.9* 10.0* 8.6* 8.0*  HCT 30.1* 30.5* 26.6* 25.9*  MCV 101* 98.7 101.5* 104.9*  PLT 282 276 230 205   Basic Metabolic Panel: Recent Labs  Lab 03/04/24 1056 03/06/24 1106 03/06/24 2046 03/07/24 0554  NA 136 137 141 144  K 3.7 3.5 3.2* 3.2*  CL 94* 96* 102 108  CO2 26 26 27 24   GLUCOSE 101* 92 96 86  BUN 61* 58* 65* 59*  CREATININE 5.15* 6.00* 5.10* 5.01*  CALCIUM  17.0* >15.0* >15.0* 14.2*  MG  --  4.3*  --   --   PHOS  --  3.4  --   --    GFR: Estimated Creatinine Clearance: 11.7 mL/min (A) (by C-G formula based on SCr of 5.01 mg/dL (H)). Liver Function Tests: Recent Labs  Lab 03/04/24 1056 03/06/24 1106 03/06/24 2046 03/07/24 0554  AST 72* 86*  --  66*  ALT 62* 78*  --  61*  ALKPHOS 78 82  --  84  BILITOT 0.4 0.3  --  0.4  PROT 7.5 7.9  --  6.5  ALBUMIN  4.5 4.5 3.9 3.8   Recent Labs  Lab 03/04/24 1056 03/06/24 1106  LIPASE 113* 107*   No results for input(s): AMMONIA in the last 168 hours. Coagulation Profile: No results for input(s): INR, PROTIME in the last 168 hours. Cardiac Enzymes: Recent Labs  Lab 03/06/24 1106  CKTOTAL 323*   BNP (last 3 results) Recent Labs    03/06/24 1106  PROBNP 192.0   HbA1C: No results for input(s): HGBA1C in the last 72 hours. CBG: No results for input(s): GLUCAP in the last 168 hours. Lipid Profile: No results for input(s): CHOL, HDL, LDLCALC, TRIG, CHOLHDL, LDLDIRECT in the last 72  hours. Thyroid  Function Tests: No results for input(s): TSH, T4TOTAL, FREET4, T3FREE, THYROIDAB in the last 72 hours.  Anemia Panel: No results for input(s): VITAMINB12, FOLATE, FERRITIN, TIBC, IRON, RETICCTPCT in the last 72 hours.  Sepsis Labs: No results for input(s): PROCALCITON, LATICACIDVEN in the last 168 hours.  Recent Results (from the past 240 hours)  Urine Culture     Status: None   Collection Time: 03/04/24 11:56 AM   Specimen: Urine   Urine  Result Value Ref Range Status   Urine Culture, Routine Final report  Final   Organism ID, Bacteria No growth  Final         Radiology Studies: DG Bone Survey Met Result Date: 03/07/2024 CLINICAL DATA:  Hypercalcemia.  Bilateral lower leg pain. EXAM: METASTATIC BONE SURVEY COMPARISON:  None Available. FINDINGS: Lateral skull radiograph: No focal lytic lesion. No depressed fracture of the cranial vault. Probable embolization coil noted overlying the sellar region. Bilateral shoulders: No  focal lytic lesion. Mild degenerative changes of bilateral glenohumeral and acromioclavicular joints. Bilateral humeri: No focal lytic lesion. No acute fracture or dislocation. Bilateral forearm: No focal lytic lesion. No acute fracture or dislocation. Cervical spine: No focal lytic lesion. Normal cervical spinal curvature. Mild-to-moderate multilevel degenerative changes noted, most pronounced at C3-4 and C5-6 levels. Thoracic spine: No focal lytic lesion. Normal thoracic curvature. Mild-to-moderate multilevel degenerative changes noted. No acute vertebral compression fracture. Lumbar spine: No focal lytic lesion. Normal lumbar lordosis. No acute compression fracture. Mild multilevel degenerative changes characterized by facet arthropathy and marginal osteophyte formation. Pelvis: No focal lytic lesion. No acute fracture or dislocation. Sacral arcuate lines are intact. Changes of chronic pubic symphysisitis. Mild degenerative  changes of bilateral hip joints without significant joint space narrowing. Bilateral femur: No focal lytic lesion. No acute fracture or dislocation. Bilateral legs: No focal lytic lesion. No acute fracture or dislocation. Bilateral ankle mortises are intact. Chest: No focal lytic lesion. Bilateral lungs are clear. Bilateral lateral costophrenic angle are clear. Normal cardiomediastinal silhouette. IMPRESSION: *No focal lytic lesion seen. Multiple chronic findings, as described above. Electronically Signed   By: Ree Molt M.D.   On: 03/07/2024 17:04   US  RENAL Result Date: 03/06/2024 EXAM: US  Retroperitoneum Complete, Renal. CLINICAL HISTORY: 409830 AKI (acute kidney injury) (HCC) 409830; 2665 Hypercalcemia 2665. TECHNIQUE: Real-time ultrasound of the retroperitoneum (complete) with image documentation. COMPARISON: CT abdomen / pelvis dated 03/04/2024. FINDINGS: RIGHT KIDNEY: Measures 12.7 x 5.5 x 3.9 cm (calculated volume 142 ml). Echogenic renal parenchyma, suggesting medical renal disease. No hydronephrosis, renal stone, or mass visualized. LEFT KIDNEY: Measures 11.2 x 4.8 x 4.3 cm (calculated volume 121 ml). Echogenic renal parenchyma, suggesting medical renal disease. No hydronephrosis, renal stone, or mass visualized. BLADDER: Unremarkable as visualized. IMPRESSION: 1. No acute findings. 2. Suspected chronic medical renal disease. Electronically signed by: Pinkie Pebbles MD 03/06/2024 09:10 PM EDT RP Workstation: [id number]   DG Chest Port 1 View Result Date: 03/06/2024 CLINICAL DATA:  Acute renal failure. EXAM: PORTABLE CHEST 1 VIEW COMPARISON:  09/23/2023 FINDINGS: Normal mediastinum and cardiac silhouette. Normal pulmonary vasculature. No evidence of effusion, infiltrate, or pneumothorax. No acute bony abnormality. IMPRESSION: No acute cardiopulmonary process. Electronically Signed   By: Jackquline Boxer M.D.   On: 03/06/2024 12:21        Scheduled Meds:  calcitonin  4 Units/kg  Subcutaneous BID   Chlorhexidine  Gluconate Cloth  6 each Topical Daily   feeding supplement  237 mL Oral BID BM   fluticasone   1 spray Each Nare Daily   folic acid   1 mg Oral Daily   Influenza vac split trivalent PF  0.5 mL Intramuscular Tomorrow-1000   multivitamin with minerals  1 tablet Oral Daily   pantoprazole   40 mg Oral BID   thiamine   100 mg Oral Daily   Or   thiamine   100 mg Intravenous Daily   Continuous Infusions:  sodium chloride  200 mL/hr at 03/08/24 0528     LOS: 1 day    Time spent: 35 Minutes    Walfred Bettendorf A Adysen Raphael, MD [facility]   If 7PM-7AM, please contact night-coverage [email or web address]  03/08/2024, 7:40 AM

## 2024-03-08 NOTE — Anesthesia Preprocedure Evaluation (Signed)
 Anesthesia Evaluation  Patient identified by MRN, date of birth, ID band Patient awake    Reviewed: Allergy & Precautions, NPO status , Patient's Chart, lab work & pertinent test results  History of Anesthesia Complications Negative for: history of anesthetic complications  Airway Mallampati: III  TM Distance: >3 FB Neck ROM: Full    Dental  (+) Teeth Intact, Dental Advisory Given   Pulmonary neg shortness of breath, asthma , sleep apnea and Continuous Positive Airway Pressure Ventilation , COPD,  COPD inhaler, neg recent URI, former smoker   breath sounds clear to auscultation       Cardiovascular hypertension, Pt. on medications (-) angina (-) Past MI and (-) CHF  Rhythm:Regular   1. Left ventricular ejection fraction, by estimation, is 60 to 65%. The  left ventricle has normal function. The left ventricle has no regional  wall motion abnormalities. Left ventricular diastolic parameters were  normal.   2. Right ventricular systolic function is normal. The right ventricular  size is normal.   3. The mitral valve is normal in structure. Trivial mitral valve  regurgitation. No evidence of mitral stenosis.   4. The aortic valve is normal in structure. Aortic valve regurgitation is  not visualized. No aortic stenosis is present.   5. The inferior vena cava is normal in size with greater than 50%  respiratory variability, suggesting right atrial pressure of 3 mmHg.     Neuro/Psych  negative psych ROS   GI/Hepatic negative GI ROS,,,(+) Cirrhosis       Lab Results      Component                Value               Date                      ALT                      61 (H)              03/07/2024                AST                      66 (H)              03/07/2024                ALKPHOS                  84                  03/07/2024                BILITOT                  0.4                 03/07/2024              Endo/Other     Class 3 obesity  Renal/GU ARFRenal diseaseLab Results      Component                Value               Date  NA                       144                 03/08/2024                K                        3.5                 03/08/2024                CO2                      22                  03/08/2024                GLUCOSE                  99                  03/08/2024                BUN                      43 (H)              03/08/2024                CREATININE               4.07 (H)            03/08/2024                CALCIUM                   13.1 (HH)           03/08/2024                EGFR                     8 (L)               03/04/2024                GFRNONAA                 11 (L)              03/08/2024                Musculoskeletal   Abdominal  (+) + obese Abdomen: soft.   Peds  Hematology  (+) Blood dyscrasia, anemia Lab Results      Component                Value               Date                      WBC                      9.9                 03/08/2024  HGB                      8.0 (L)             03/08/2024                HCT                      25.9 (L)            03/08/2024                MCV                      104.9 (H)           03/08/2024                PLT                      205                 03/08/2024              Anesthesia Other Findings   Reproductive/Obstetrics                              Anesthesia Physical Anesthesia Plan  ASA: 4  Anesthesia Plan: MAC   Post-op Pain Management: Minimal or no pain anticipated   Induction: Intravenous  PONV Risk Score and Plan: 2 and Propofol  infusion and Treatment may vary due to age or medical condition  Airway Management Planned: Natural Airway, Nasal Cannula and Simple Face Mask  Additional Equipment: None  Intra-op Plan:   Post-operative Plan:   Informed Consent: I have reviewed the patients History and  Physical, chart, labs and discussed the procedure including the risks, benefits and alternatives for the proposed anesthesia with the patient or authorized representative who has indicated his/her understanding and acceptance.     Dental advisory given  Plan Discussed with: CRNA  Anesthesia Plan Comments:          Anesthesia Quick Evaluation

## 2024-03-08 NOTE — Transfer of Care (Signed)
 Immediate Anesthesia Transfer of Care Note  Patient: Marissa Willis  Procedure(s) Performed: EGD (ESOPHAGOGASTRODUODENOSCOPY)  Patient Location: PACU  Anesthesia Type:MAC  Level of Consciousness: drowsy and patient cooperative  Airway & Oxygen  Therapy: Patient Spontanous Breathing and Patient connected to face mask oxygen   Post-op Assessment: Report given to RN and Post -op Vital signs reviewed and stable  Post vital signs: Reviewed and stable  Last Vitals:  Vitals Value Taken Time  BP 114/27 03/08/24 10:15  Temp 36.6 C 03/08/24 10:14  Pulse 73 03/08/24 10:16  Resp 27 03/08/24 10:16  SpO2 91 % 03/08/24 10:16  Vitals shown include unfiled device data.  Last Pain:  Vitals:   03/08/24 1014  TempSrc:   PainSc: 0-No pain      Patients Stated Pain Goal: 0 (03/08/24 0851)  Complications: No notable events documented.

## 2024-03-08 NOTE — Anesthesia Postprocedure Evaluation (Signed)
 Anesthesia Post Note  Patient: Marissa Willis  Procedure(s) Performed: EGD (ESOPHAGOGASTRODUODENOSCOPY)     Patient location during evaluation: Endoscopy Anesthesia Type: MAC Level of consciousness: awake and alert Pain management: pain level controlled Vital Signs Assessment: post-procedure vital signs reviewed and stable Respiratory status: spontaneous breathing, nonlabored ventilation and respiratory function stable Cardiovascular status: stable and blood pressure returned to baseline Postop Assessment: no apparent nausea or vomiting Anesthetic complications: no   No notable events documented.                  Serenna Deroy

## 2024-03-08 NOTE — Progress Notes (Signed)
 [facility] Progress Note  Subjective:  I/O 1.7 L in and 2.0 L UOP = net neg 270 cc yest No SOB or swelling Still fatigued No n/v issues   Vitals:   03/08/24 1030 03/08/24 1042 03/08/24 1103 03/08/24 1305  BP: (!) 125/50 (!) 125/54 (!) 146/56 (!) 126/55  Pulse: 73 72 73 72  Resp: (!) 25 (!) 23 18 16   Temp:  98.2 F (36.8 C) 99.1 F (37.3 C) 98.7 F (37.1 C)  TempSrc:   Oral Oral  SpO2: 93% 93% 95% 90%  Weight:      Height:        Exam: Gen alert, no distress Sclera anicteric, throat clear and moist No jvd or bruits Chest clear bilat to bases RRR no MRG Abd soft ntnd no mass or ascites +bs Ext no LE or UE edema, no other edema Neuro is alert, Ox 3 , nf     Home meds: Albuterol  Airsupra  Duoneb Lisinopril -hydrochlorothiazide  20-25 every day MVI Prilosec 40 every day     Date                            Creat               eGFR (ml/min) 2014- feb 2017    0.80- 0.95   11/2015    1.23- 1.40 12/2015- 11/2022 0.72- 1.04 55- > 60 ml/min 09/24/23                          1.10                54 ml/min 03/04/24                        5.15                 8 03/06/24                        6.00                 7                 UA trace LE, prot 100, 0-5 rbc/ epi, 6-10 wbc's UNa 37, UCr 78 UP/C ratio = 0.54 Renal US : 12.7/ 11.2 cm kidneys, no hydro, bilat ^echo  CXR - no active disease PTH - pending Vit D - 53    Assessment/ Plan: AKI: b/l creatinine 1.1 from April 2025, eGFR 54 ml/min. Creat here is 6.0 in the setting of new onset severe hypercalcemia. UA showed mild protein and wbc's. Renal US  w/o obstruction. AKI felt to be due to severe hypercalcemia + vol depletion +/- home acei and/or others. Rx is calcitonin, zometa  and high-rate isotonic IVF's. Has 1 more dose of calcitonin it appears, this evening. Already rec'd zoledronic  acid IV on 9/19. Ordered myeloma w/u, results pending. Creat down to 4.0 today which is good. Pt looks a little better, still  fatigued. Will cont IVFs at 200 cc/hr as rx for hypercalcemia. Will follow.   Hypercalcemia: calcium  was > 15 on admission, was down to 14 yesterday and 13.1 today. Improving. Will cont IVFs indefinitely and is due 1 more dose of calcitonin. S/p zometa . Unclear cause, labs are pending.  Cirrhosis: by imaging, no real symptoms  H/o COPD HTN    Myer Fret MD  [facility]  03/08/2024, 2:42 PM  Recent Labs  Lab 03/06/24 1106 03/06/24 2046 03/07/24 0554 03/08/24 0636  HGB 10.0*  --  8.6* 8.0*  ALBUMIN  4.5   < > 3.8 3.7  CALCIUM  >15.0*   < > 14.2* 13.1*  PHOS 3.4  --   --  2.7  CREATININE 6.00*   < > 5.01* 4.07*  K 3.5   < > 3.2* 3.5   < > = values in this interval not displayed.   Recent Labs  Lab 03/04/24 1056  IRON 104  TIBC 414  FERRITIN 182*   Inpatient medications:  calcitonin  4 Units/kg Subcutaneous BID   calcitonin  400 Units Subcutaneous BID   Chlorhexidine  Gluconate Cloth  6 each Topical Daily   feeding supplement  237 mL Oral BID BM   fluticasone   1 spray Each Nare Daily   folic acid   1 mg Oral Daily   Influenza vac split trivalent PF  0.5 mL Intramuscular Tomorrow-1000   multivitamin with minerals  1 tablet Oral Daily   pantoprazole   40 mg Oral BID   thiamine   100 mg Oral Daily   Or   thiamine   100 mg Intravenous Daily    sodium chloride  200 mL/hr at 03/08/24 0528   acetaminophen  **OR** acetaminophen , ipratropium-albuterol , LORazepam  **OR** LORazepam , ondansetron  **OR** ondansetron  (ZOFRAN ) IV, simethicone 

## 2024-03-08 NOTE — Plan of Care (Signed)

## 2024-03-08 NOTE — Progress Notes (Signed)
 Lab called in critical lab for pt. Calcium  13.1 MD made aware.

## 2024-03-08 NOTE — Op Note (Signed)
 [facility] Patient Name: Marissa Willis Procedure Date: 03/08/2024 MRN: [id number] Attending MD: Layla Lah , MD, [id number] Date of Birth: 05/21/1956 CSN: [id number] Age: 68 Admit Type: Inpatient Procedure:                Upper GI endoscopy Indications:              Melena Providers:                Layla Lah, MD, Randall Lines, RN, Lorrayne Kitty, Technician Referring MD:              Medicines:                Sedation Administered by an Anesthesia Professional Complications:            No immediate complications. Estimated Blood Loss:     Estimated blood loss was minimal. Procedure:                Pre-Anesthesia Assessment:                           - Prior to the procedure, a History and Physical                            was performed, and patient medications and                            allergies were reviewed. The patient's tolerance of                            previous anesthesia was also reviewed. The risks                            and benefits of the procedure and the sedation                            options and risks were discussed with the patient.                            All questions were answered, and informed consent                            was obtained. Prior Anticoagulants: The patient has                            taken no anticoagulant or antiplatelet agents. ASA                            Grade Assessment: IV - A patient with severe                            systemic disease that is a constant threat to life.  After reviewing the risks and benefits, the patient                            was deemed in satisfactory condition to undergo the                            procedure.                           After obtaining informed consent, the endoscope was                            passed under direct vision. Throughout the                            procedure, the  patient's blood pressure, pulse, and                            oxygen  saturations were monitored continuously. The                            [id number]-H190 ([id number]) Olympus endoscope was introduced                            through the mouth, and advanced to the second part                            of duodenum. The upper GI endoscopy was                            accomplished without difficulty. The patient                            tolerated the procedure well. Scope In: Scope Out: Findings:      The Z-line was regular and was found 39 cm from the incisors.      Patchy mildly erythematous mucosa was found in the entire examined       stomach. Biopsies were taken with a cold forceps for histology.      A single small angioectasia with no bleeding was found in the gastric       body. Fulguration to ablate the lesion to prevent bleeding by argon       plasma was successful.      The cardia and gastric fundus were normal on retroflexion.      Multiple small angioectasias with stigmata of recent bleeding were found       in the duodenal bulb, in the first portion of the duodenum and in the       second portion of the duodenum. Fulguration to ablate the lesion to       prevent bleeding by argon plasma was successful. Impression:               - Z-line regular, 39 cm from the incisors.                           - Erythematous mucosa in the  stomach. Biopsied.                           - A single non-bleeding angioectasia in the                            stomach. Treated with argon plasma coagulation                            (APC).                           - Multiple recently bleeding angioectasias in the                            duodenum. Treated with argon plasma coagulation                            (APC). Moderate Sedation:      Moderate (conscious) sedation was personally administered by an       anesthesia professional. The following parameters were monitored: oxygen         saturation, heart rate, blood pressure, and response to care. Recommendation:           - Return patient to hospital ward for ongoing care.                           - Soft diet.                           - Continue present medications.                           - Await pathology results. Procedure Code(s):        --- Professional ---                           [phone], 59, Esophagogastroduodenoscopy, flexible,                            transoral; with control of bleeding, any method                           43239, Esophagogastroduodenoscopy, flexible,                            transoral; with biopsy, single or multiple Diagnosis Code(s):        --- Professional ---                           K31.89, Other diseases of stomach and duodenum                           K31.811, Angiodysplasia of stomach and duodenum                            with bleeding  K92.1, Melena (includes Hematochezia) CPT copyright 2022 [employer]. All rights reserved. The codes documented in this report are preliminary and upon coder review may  be revised to meet current compliance requirements. Layla Lah, MD Layla Lah, MD 03/08/2024 10:24:04 AM Number of Addenda: 0

## 2024-03-08 NOTE — Progress Notes (Signed)
   03/08/24 2200  BiPAP/CPAP/SIPAP  BiPAP/CPAP/SIPAP Resmed (Home)  Patient Home Machine Yes  Safety Check Completed by RT for Home Unit Yes, no issues noted  Patient Home Mask Yes  Patient Home Tubing Yes  Auto Titrate  (home settings)  Device Plugged into RED Power Outlet Yes   Home cpap use for at bedtime

## 2024-03-08 NOTE — Interval H&P Note (Signed)
 History and Physical Interval Note:  03/08/2024 8:59 AM  Marissa Willis  has presented today for surgery, with the diagnosis of Melena.  The various methods of treatment have been discussed with the patient and family. After consideration of risks, benefits and other options for treatment, the patient has consented to  Procedure(s): EGD (ESOPHAGOGASTRODUODENOSCOPY) (N/A) as a surgical intervention.  The patient's history has been reviewed, patient examined, no change in status, stable for surgery.  I have reviewed the patient's chart and labs.  Questions were answered to the patient's satisfaction.     Haley Roza

## 2024-03-09 ENCOUNTER — Encounter (HOSPITAL_COMMUNITY): Payer: Self-pay | Admitting: Gastroenterology

## 2024-03-09 ENCOUNTER — Inpatient Hospital Stay (HOSPITAL_COMMUNITY)

## 2024-03-09 LAB — CBC
HCT: 22.8 % — ABNORMAL LOW (ref 36.0–46.0)
HCT: 23.4 % — ABNORMAL LOW (ref 36.0–46.0)
Hemoglobin: 7.2 g/dL — ABNORMAL LOW (ref 12.0–15.0)
Hemoglobin: 7.5 g/dL — ABNORMAL LOW (ref 12.0–15.0)
MCH: 32 pg (ref 26.0–34.0)
MCH: 32.6 pg (ref 26.0–34.0)
MCHC: 31.6 g/dL (ref 30.0–36.0)
MCHC: 32.1 g/dL (ref 30.0–36.0)
MCV: 101.3 fL — ABNORMAL HIGH (ref 80.0–100.0)
MCV: 101.7 fL — ABNORMAL HIGH (ref 80.0–100.0)
Platelets: 173 K/uL (ref 150–400)
Platelets: 207 K/uL (ref 150–400)
RBC: 2.25 MIL/uL — ABNORMAL LOW (ref 3.87–5.11)
RBC: 2.3 MIL/uL — ABNORMAL LOW (ref 3.87–5.11)
RDW: 13 % (ref 11.5–15.5)
RDW: 13.2 % (ref 11.5–15.5)
WBC: 8.2 K/uL (ref 4.0–10.5)
WBC: 9 K/uL (ref 4.0–10.5)
nRBC: 0 % (ref 0.0–0.2)
nRBC: 0 % (ref 0.0–0.2)

## 2024-03-09 LAB — RENAL FUNCTION PANEL
Albumin: 3.5 g/dL (ref 3.5–5.0)
Anion gap: 13 (ref 5–15)
BUN: 31 mg/dL — ABNORMAL HIGH (ref 8–23)
CO2: 20 mmol/L — ABNORMAL LOW (ref 22–32)
Calcium: 11.3 mg/dL — ABNORMAL HIGH (ref 8.9–10.3)
Chloride: 112 mmol/L — ABNORMAL HIGH (ref 98–111)
Creatinine, Ser: 3.15 mg/dL — ABNORMAL HIGH (ref 0.44–1.00)
GFR, Estimated: 15 mL/min — ABNORMAL LOW (ref >60)
Glucose, Bld: 92 mg/dL (ref 70–99)
Phosphorus: 2.2 mg/dL — ABNORMAL LOW (ref 2.5–4.6)
Potassium: 3 mmol/L — ABNORMAL LOW (ref 3.5–5.1)
Sodium: 145 mmol/L (ref 135–145)

## 2024-03-09 LAB — KAPPA/LAMBDA LIGHT CHAINS
Kappa free light chain: 84.6 mg/L — ABNORMAL HIGH (ref 3.3–19.4)
Kappa, lambda light chain ratio: 1.33 (ref 0.26–1.65)
Lambda free light chains: 63.8 mg/L — ABNORMAL HIGH (ref 5.7–26.3)

## 2024-03-09 LAB — HEMOGLOBIN AND HEMATOCRIT, BLOOD
HCT: 23 % — ABNORMAL LOW (ref 36.0–46.0)
Hemoglobin: 7.5 g/dL — ABNORMAL LOW (ref 12.0–15.0)

## 2024-03-09 LAB — MAGNESIUM: Magnesium: 1.7 mg/dL (ref 1.7–2.4)

## 2024-03-09 MED ORDER — POTASSIUM CHLORIDE CRYS ER 20 MEQ PO TBCR
20.0000 meq | EXTENDED_RELEASE_TABLET | Freq: Two times a day (BID) | ORAL | Status: AC
  Administered 2024-03-09 – 2024-03-10 (×3): 20 meq via ORAL
  Filled 2024-03-09 (×3): qty 1

## 2024-03-09 MED ORDER — ARFORMOTEROL TARTRATE 15 MCG/2ML IN NEBU
15.0000 ug | INHALATION_SOLUTION | Freq: Two times a day (BID) | RESPIRATORY_TRACT | Status: DC
  Administered 2024-03-09 – 2024-03-11 (×6): 15 ug via RESPIRATORY_TRACT
  Filled 2024-03-09 (×6): qty 2

## 2024-03-09 MED ORDER — IPRATROPIUM-ALBUTEROL 0.5-2.5 (3) MG/3ML IN SOLN: 3.0000 mL | Freq: Four times a day (QID) | RESPIRATORY_TRACT | Status: DC

## 2024-03-09 MED ORDER — BUDESONIDE 0.25 MG/2ML IN SUSP
0.2500 mg | Freq: Two times a day (BID) | RESPIRATORY_TRACT | Status: DC
  Administered 2024-03-09 – 2024-03-11 (×6): 0.25 mg via RESPIRATORY_TRACT
  Filled 2024-03-09 (×6): qty 2

## 2024-03-09 MED ORDER — IPRATROPIUM-ALBUTEROL 0.5-2.5 (3) MG/3ML IN SOLN
3.0000 mL | Freq: Four times a day (QID) | RESPIRATORY_TRACT | Status: DC
  Administered 2024-03-09 – 2024-03-11 (×8): 3 mL via RESPIRATORY_TRACT
  Filled 2024-03-09 (×8): qty 3

## 2024-03-09 MED ORDER — FUROSEMIDE 10 MG/ML IJ SOLN
40.0000 mg | Freq: Two times a day (BID) | INTRAMUSCULAR | Status: AC
  Administered 2024-03-09 – 2024-03-10 (×2): 40 mg via INTRAVENOUS
  Filled 2024-03-09 (×2): qty 4

## 2024-03-09 NOTE — Progress Notes (Signed)
 UNASSIGNED PATIENT Subjective: Events since admission noted chart reviewed patient seen and examined. Patient denies having abdominal pain or nausea at this time she continues to have some melenic stools even after the EGD she had some gastric and duodenal AVM's were ablated.  She is slightly short of breath and has longstanding history of COPD secondary to tobacco abuse. Stool studies are pending for C. difficile. She gives a history of having nausea and anorexia and is requesting an antiemetic. Objective: Vital signs in last 24 hours: Temp:  [97.8 F (36.6 C)-100 F (37.8 C)] 98.9 F (37.2 C) (09/22 0443) Pulse Rate:  [71-76] 76 (09/22 0443) Resp:  [16-25] 18 (09/22 0443) BP: (103-157)/(37-89) 157/58 (09/22 0443) SpO2:  [89 %-95 %] 93 % (09/22 0443) Weight:  [101.8 kg] 101.8 kg (09/21 0851) Last BM Date : 03/08/24  Intake/Output from previous day: 09/21 0701 - 09/22 0700 In: 640 [P.O.:340; I.V.:300] Out: 2350 [Urine:2350] Intake/Output this shift: No intake/output data recorded.  General appearance: alert, cooperative, appears stated age, fatigued, mild distress, morbidly obese, and pale Resp: wheezes bilaterally Cardio: regular rate and rhythm, S1, S2 normal, no murmur, click, rub or gallop GI: soft, non-tender; bowel sounds normal; no masses,  no organomegaly  Lab Results: Recent Labs    03/07/24 0554 03/08/24 0636 03/09/24 0530  WBC 10.4 9.9 8.2  HGB 8.6* 8.0* 7.2*  HCT 26.6* 25.9* 22.8*  PLT 230 205 173   BMET Recent Labs    03/07/24 0554 03/08/24 0636 03/09/24 0530  NA 144 144 145  K 3.2* 3.5 3.0*  CL 108 110 112*  CO2 24 22 20*  GLUCOSE 86 99 92  BUN 59* 43* 31*  CREATININE 5.01* 4.07* 3.15*  CALCIUM  14.2* 13.1* 11.3*   LFT Recent Labs    03/07/24 0554 03/08/24 0636 03/09/24 0530  PROT 6.5  --   --   ALBUMIN  3.8   < > 3.5  AST 66*  --   --   ALT 61*  --   --   ALKPHOS 84  --   --   BILITOT 0.4  --   --    < > = values in this interval not  displayed.   PT/INR No results for input(s): LABPROT, INR in the last 72 hours. Hepatitis Panel No results for input(s): HEPBSAG, HCVAB, HEPAIGM, HEPBIGM in the last 72 hours. C-Diff No results for input(s): CDIFFTOX in the last 72 hours. No results for input(s): CDIFFPCR in the last 72 hours. Fecal Lactopherrin No results for input(s): FECLLACTOFRN in the last 72 hours.  Studies/Results: DG Bone Survey Met Result Date: 03/07/2024 CLINICAL DATA:  Hypercalcemia.  Bilateral lower leg pain. EXAM: METASTATIC BONE SURVEY COMPARISON:  None Available. FINDINGS: Lateral skull radiograph: No focal lytic lesion. No depressed fracture of the cranial vault. Probable embolization coil noted overlying the sellar region. Bilateral shoulders: No focal lytic lesion. Mild degenerative changes of bilateral glenohumeral and acromioclavicular joints. Bilateral humeri: No focal lytic lesion. No acute fracture or dislocation. Bilateral forearm: No focal lytic lesion. No acute fracture or dislocation. Cervical spine: No focal lytic lesion. Normal cervical spinal curvature. Mild-to-moderate multilevel degenerative changes noted, most pronounced at C3-4 and C5-6 levels. Thoracic spine: No focal lytic lesion. Normal thoracic curvature. Mild-to-moderate multilevel degenerative changes noted. No acute vertebral compression fracture. Lumbar spine: No focal lytic lesion. Normal lumbar lordosis. No acute compression fracture. Mild multilevel degenerative changes characterized by facet arthropathy and marginal osteophyte formation. Pelvis: No focal lytic lesion. No acute fracture or  dislocation. Sacral arcuate lines are intact. Changes of chronic pubic symphysisitis. Mild degenerative changes of bilateral hip joints without significant joint space narrowing. Bilateral femur: No focal lytic lesion. No acute fracture or dislocation. Bilateral legs: No focal lytic lesion. No acute fracture or dislocation. Bilateral  ankle mortises are intact. Chest: No focal lytic lesion. Bilateral lungs are clear. Bilateral lateral costophrenic angle are clear. Normal cardiomediastinal silhouette. IMPRESSION: *No focal lytic lesion seen. Multiple chronic findings, as described above. Electronically Signed   By: Ree Molt M.D.   On: 03/07/2024 17:04    Medications: I have reviewed the patient's current medications. Prior to Admission:  Medications Prior to Admission  Medication Sig Dispense Refill Last Dose/Taking   Albuterol  Sulfate (PROAIR  RESPICLICK) 108 (90 Base) MCG/ACT AEPB Inhale 2 puffs into the lungs every 6 (six) hours as needed (shortness of breath/wheezing). 1 each 2 03/06/2024   fluticasone  (FLONASE ) 50 MCG/ACT nasal spray Place 1 spray into both nostrils daily. 16 g 0 03/06/2024   lisinopril -hydrochlorothiazide  (ZESTORETIC ) 20-25 MG tablet Take 1 tablet by mouth daily. 90 tablet 1 Past Week   Multiple Vitamin (MULTIVITAMIN) capsule Take 1 capsule by mouth daily.   Past Week   ZYPITAMAG  4 MG TABS Take 1 tablet (4 mg total) by mouth daily. 90 tablet 3 Past Week   Albuterol -Budesonide  (AIRSUPRA ) 90-80 MCG/ACT AERO Inhale 2 puffs into the lungs every 6 (six) hours as needed. (Patient not taking: Reported on 03/06/2024) 10.7 g 2 Not Taking   ipratropium-albuterol  (DUONEB) 0.5-2.5 (3) MG/3ML SOLN Take 3 mLs by nebulization every 6 (six) hours as needed. (Patient not taking: Reported on 03/06/2024) 120 mL 3 Not Taking   omeprazole  (PRILOSEC) 40 MG capsule Take 1 capsule (40 mg total) by mouth daily. (Patient not taking: Reported on 03/06/2024) 60 capsule 1 Not Taking   Scheduled:  arformoterol   15 mcg Nebulization BID   budesonide  (PULMICORT ) nebulizer solution  0.25 mg Nebulization BID   Chlorhexidine  Gluconate Cloth  6 each Topical Daily   feeding supplement  237 mL Oral BID BM   fluticasone   1 spray Each Nare Daily   folic acid   1 mg Oral Daily   furosemide   40 mg Intravenous BID   Influenza vac split trivalent  PF  0.5 mL Intramuscular Tomorrow-1000   ipratropium-albuterol   3 mL Nebulization Q6H   multivitamin with minerals  1 tablet Oral Daily   pantoprazole   40 mg Oral BID   potassium chloride   20 mEq Oral BID   thiamine   100 mg Oral Daily   Or   thiamine   100 mg Intravenous Daily   Continuous: PRN:acetaminophen  **OR** acetaminophen , ipratropium-albuterol , LORazepam  **OR** LORazepam , ondansetron  **OR** ondansetron  (ZOFRAN ) IV, simethicone   Assessment/Plan: 1) Acute on chronic blood loss anemia with melenic stool 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 2) acute acute kidney injury superimposed on chronic kidney disease stage IIIa. 3) Hypercalcemia which seems to be improving with IV fluids. 4) Hypokalemia. 5) Stool studies pending for C. difficile toxin assay. 6) Nausea and anorexia-we will try IV Zofran  as discussed with the nurse.  LOS: 2 days   Renaye Sous 03/09/2024, 7:17 AM

## 2024-03-09 NOTE — Progress Notes (Signed)
   03/09/24 1319  TOC Brief Assessment  Insurance and Status Reviewed  Patient has primary care physician Yes  Home environment has been reviewed home w/ spouse  Prior level of function: independent  Prior/Current Home Services No current home services  Social Drivers of Health Review SDOH reviewed no interventions necessary  Readmission risk has been reviewed Yes  Transition of care needs no transition of care needs at this time

## 2024-03-09 NOTE — Progress Notes (Signed)
 Mobility Specialist - Progress Note    03/09/24 1122  Mobility  Activity Ambulated with assistance  Level of Assistance Standby assist, set-up cues, supervision of patient - no hands on  Assistive Device Front wheel walker  Distance Ambulated (ft) 50 ft  Range of Motion/Exercises Active  Activity Response Tolerated fair  Mobility Referral Yes  Mobility visit 1 Mobility  Mobility Specialist Start Time (ACUTE ONLY) 1055  Mobility Specialist Stop Time (ACUTE ONLY) 1105  Mobility Specialist Time Calculation (min) (ACUTE ONLY) 10 min     Pt was received in bed and agreed to mobility. Upon sitting EOB, pt expressed dizziness. BP was 153/60. Dizziness faded and continued to walk. Nearing EOS, pt began labored breathing. SpO2 %88 on room air. Returned to chair on [place] with al needs met.   [employer] - Mobility Specialist

## 2024-03-09 NOTE — Progress Notes (Addendum)
 PROGRESS NOTE    Marissa Willis  FMW:[id number] DOB: [birth year] DOA: 03/06/2024 PCP: Antoniette Vermell CROME, PA-C   Brief Narrative: 68 year old with past medical history significant for asthma, COPD, complex partial seizure disorder, GERD, memory changes, pulmonary nodules, prediabetes, mitral valve regurgitation, nephrolithiasis, history of retained ureteral stents, cerebral aneurysmal repair, hyperlipidemia, OSA on CPAP, stress urinary incontinence, vitamin D  deficiency, class II obesity, liver cirrhosis who was sent from her PCP office due to AKI and hypercalcemia.  Patient takes hydrochlorothiazide  vitamin D  and calcium  supplements.  She has been feeling very weak and having trouble walking from 1 room to another.  Evaluation in the ED hemoglobin 10, lipase 107, proBNP 192, BUN 58, creatinine 6, calcium  greater than 15.  Mild transaminases.  Chest x-ray no acute cardiopulmonary process, CT abdomen and pelvis with contrast done 2 days prior to admission showed hepatic asteatosis and nodular hepatic contour suggesting hepatic cirrhosis diverticulosis.  Patient was started on IV fluids, calcitonin.   Assessment & Plan:   Principal Problem:   Hypercalcemia Active Problems:   Mixed hyperlipidemia   Aortic atherosclerosis (HCC)   Hepatic steatosis   Benign essential hypertension   AKI (acute kidney injury) (HCC)   Hypermagnesemia   Heme positive stool   Transaminitis   Chronic blood loss anemia  1-AKI;  In setting hypercalcemia, volume depletion, and ACE.  Patient presented with a creatinine of 6, BUN 60 CK level 323, Renal US :  No acute findings. Suspected chronic medical renal disease. Continue with IV fluids.  MM panel pending.  Bone survey negative.  Cr down to 3 l. Plan to discontinue IV fluids, concern for pulmonary edema.   2-Hypercalcemia: Vitamin D  53 Received calcitonin Treated  with IV fluids Received a dose of zoledronic  acid Unclear etiology  She denies history  of malignancy.  MM panel pending.  bone survey negative for lytic  lesions.  PTH: 31 PTH like peptide pending. Awaiting MM panel.  Ca down to 11.   Acute on chronic blood loss anemia Heme Positive stool -GI consulted.  - Underwent endoscopy on 9/21: Showed erythematous mucosa in the stomach, a single nonbleeding angioectasia in the stomach treated with argon plasma coagulation, multiple recent bleeding and he will ectasis in the duodenum treated with argon plasma. -IV Protonix  IV BID.  Monitor hb down to 7, could be hemodilution, have been getting 200 cc IV fluids.  Plan  to monitor on soft diet. Repeat CBC this afternoon, if low will proceed with transfusion.   Dyspnea; Acute pulmonary edema.  In setting IV fluids.  Will schedule nebulizer.  Stop IV fluids.  Checking chest x ray.  Might need diuretics.   Hyperlipidemia - Holding statin for now  Transaminases Hepatic steatosis, cirrhosis: - Continue to monitor liver function test, - Cirrhosis by imaging  Hypertension - Holding hydrochlorothiazide  and lisinopril  in the setting of AKI and hypercalcemia  Hypomagnesemia Monitor, on IV fluid  Asthma/COPD: Continue with nebulizer Alcohol use;  Will monitor on CIWA Hypokalemia; replete orally   Estimated body mass index is 41.05 kg/m as calculated from the following:   Height as of this encounter: 5' 2 (1.575 m).   Weight as of this encounter: 101.8 kg.   DVT prophylaxis: SCDs Code Status: Full code Family Communication: Husband who was at bedside Disposition Plan:  Status is: Observation The patient will require care spanning > 2 midnights and should be moved to inpatient because: Management of hypercalcemia and AKI    Consultants:  Nephrology GI  Procedures:  Renal ultrasound  Antimicrobials:    Subjective: She is SOB today. Was having wheezing this am. Got breathing treatment. Had BM black stool.   Objective: Vitals:   03/08/24 1305 03/08/24 1504  03/08/24 1955 03/09/24 0443  BP: (!) 126/55 130/60 (!) 148/52 (!) 157/58  Pulse: 72 72 74 76  Resp: 16  17 18   Temp: 98.7 F (37.1 C)  100 F (37.8 C) 98.9 F (37.2 C)  TempSrc: Oral  Oral Oral  SpO2: 90%  91% 93%  Weight:      Height:        Intake/Output Summary (Last 24 hours) at 03/09/2024 1113 Last data filed at 03/09/2024 1100 Gross per 24 hour  Intake 5262.45 ml  Output 2050 ml  Net 3212.45 ml   Filed Weights   03/08/24 0500 03/08/24 0851  Weight: 101.8 kg 101.8 kg    Examination:  General exam; NAD Respiratory system: tachypnea, BL wheezing.  Cardiovascular system: S 1, S 2 RRR Gastrointestinal system: BS present, soft, nt Central nervous system: alert Extremities: trace edema    Data Reviewed: I have personally reviewed following labs and imaging studies  CBC: Recent Labs  Lab 03/04/24 1056 03/06/24 1106 03/07/24 0554 03/08/24 0636 03/09/24 0530  WBC 12.2* 10.4 10.4 9.9 8.2  NEUTROABS 6.7 6.0  --   --   --   HGB 9.9* 10.0* 8.6* 8.0* 7.2*  HCT 30.1* 30.5* 26.6* 25.9* 22.8*  MCV 101* 98.7 101.5* 104.9* 101.3*  PLT 282 276 230 205 173   Basic Metabolic Panel: Recent Labs  Lab 03/06/24 1106 03/06/24 2046 03/07/24 0554 03/08/24 0636 03/09/24 0530  NA 137 141 144 144 145  K 3.5 3.2* 3.2* 3.5 3.0*  CL 96* 102 108 110 112*  CO2 26 27 24 22  20*  GLUCOSE 92 96 86 99 92  BUN 58* 65* 59* 43* 31*  CREATININE 6.00* 5.10* 5.01* 4.07* 3.15*  CALCIUM  >15.0* >15.0* 14.2* 13.1* 11.3*  MG 4.3*  --   --   --  1.7  PHOS 3.4  --   --  2.7 2.2*   GFR: Estimated Creatinine Clearance: 18.6 mL/min (A) (by C-G formula based on SCr of 3.15 mg/dL (H)). Liver Function Tests: Recent Labs  Lab 03/04/24 1056 03/06/24 1106 03/06/24 2046 03/07/24 0554 03/08/24 0636 03/09/24 0530  AST 72* 86*  --  66*  --   --   ALT 62* 78*  --  61*  --   --   ALKPHOS 78 82  --  84  --   --   BILITOT 0.4 0.3  --  0.4  --   --   PROT 7.5 7.9  --  6.5  --   --   ALBUMIN  4.5  4.5 3.9 3.8 3.7 3.5   Recent Labs  Lab 03/04/24 1056 03/06/24 1106  LIPASE 113* 107*   No results for input(s): AMMONIA in the last 168 hours. Coagulation Profile: No results for input(s): INR, PROTIME in the last 168 hours. Cardiac Enzymes: Recent Labs  Lab 03/06/24 1106  CKTOTAL 323*   BNP (last 3 results) Recent Labs    03/06/24 1106  PROBNP 192.0   HbA1C: No results for input(s): HGBA1C in the last 72 hours. CBG: No results for input(s): GLUCAP in the last 168 hours. Lipid Profile: No results for input(s): CHOL, HDL, LDLCALC, TRIG, CHOLHDL, LDLDIRECT in the last 72 hours. Thyroid  Function Tests: No results for input(s): TSH, T4TOTAL, FREET4, T3FREE, THYROIDAB in the last 72 hours.  Anemia Panel: No results for input(s): VITAMINB12, FOLATE, FERRITIN, TIBC, IRON, RETICCTPCT in the last 72 hours.  Sepsis Labs: No results for input(s): PROCALCITON, LATICACIDVEN in the last 168 hours.  Recent Results (from the past 240 hours)  Urine Culture     Status: None   Collection Time: 03/04/24 11:56 AM   Specimen: Urine   Urine  Result Value Ref Range Status   Urine Culture, Routine Final report  Final   Organism ID, Bacteria No growth  Final         Radiology Studies: DG Bone Survey Met Result Date: 03/07/2024 CLINICAL DATA:  Hypercalcemia.  Bilateral lower leg pain. EXAM: METASTATIC BONE SURVEY COMPARISON:  None Available. FINDINGS: Lateral skull radiograph: No focal lytic lesion. No depressed fracture of the cranial vault. Probable embolization coil noted overlying the sellar region. Bilateral shoulders: No focal lytic lesion. Mild degenerative changes of bilateral glenohumeral and acromioclavicular joints. Bilateral humeri: No focal lytic lesion. No acute fracture or dislocation. Bilateral forearm: No focal lytic lesion. No acute fracture or dislocation. Cervical spine: No focal lytic lesion. Normal cervical spinal  curvature. Mild-to-moderate multilevel degenerative changes noted, most pronounced at C3-4 and C5-6 levels. Thoracic spine: No focal lytic lesion. Normal thoracic curvature. Mild-to-moderate multilevel degenerative changes noted. No acute vertebral compression fracture. Lumbar spine: No focal lytic lesion. Normal lumbar lordosis. No acute compression fracture. Mild multilevel degenerative changes characterized by facet arthropathy and marginal osteophyte formation. Pelvis: No focal lytic lesion. No acute fracture or dislocation. Sacral arcuate lines are intact. Changes of chronic pubic symphysisitis. Mild degenerative changes of bilateral hip joints without significant joint space narrowing. Bilateral femur: No focal lytic lesion. No acute fracture or dislocation. Bilateral legs: No focal lytic lesion. No acute fracture or dislocation. Bilateral ankle mortises are intact. Chest: No focal lytic lesion. Bilateral lungs are clear. Bilateral lateral costophrenic angle are clear. Normal cardiomediastinal silhouette. IMPRESSION: *No focal lytic lesion seen. Multiple chronic findings, as described above. Electronically Signed   By: Ree Molt M.D.   On: 03/07/2024 17:04        Scheduled Meds:  arformoterol   15 mcg Nebulization BID   budesonide  (PULMICORT ) nebulizer solution  0.25 mg Nebulization BID   calcitonin  400 Units Subcutaneous BID   Chlorhexidine  Gluconate Cloth  6 each Topical Daily   feeding supplement  237 mL Oral BID BM   fluticasone   1 spray Each Nare Daily   folic acid   1 mg Oral Daily   Influenza vac split trivalent PF  0.5 mL Intramuscular Tomorrow-1000   ipratropium-albuterol   3 mL Nebulization Q6H   multivitamin with minerals  1 tablet Oral Daily   pantoprazole   40 mg Oral BID   thiamine   100 mg Oral Daily   Or   thiamine   100 mg Intravenous Daily   Continuous Infusions:     LOS: 2 days    Time spent: 35 Minutes    Xitlali Kastens A Rafia Shedden, MD [facility]   If  7PM-7AM, please contact night-coverage [email or web address]  03/09/2024, 11:13 AM

## 2024-03-09 NOTE — Plan of Care (Signed)
  Problem: Activity: Goal: Risk for activity intolerance will decrease Outcome: Progressing   Problem: Coping: Goal: Level of anxiety will decrease Outcome: Progressing   Problem: Elimination: Goal: Will not experience complications related to bowel motility Outcome: Progressing   Problem: Pain Managment: Goal: General experience of comfort will improve and/or be controlled Outcome: Progressing   Problem: Safety: Goal: Ability to remain free from injury will improve Outcome: Progressing   Problem: Skin Integrity: Goal: Risk for impaired skin integrity will decrease Outcome: Progressing

## 2024-03-09 NOTE — Progress Notes (Signed)
 Patient ID: Marissa Willis, female   DOB: 04/06/1956, 68 y.o.   MRN: [id number] [facility] Progress Note   Assessment/ Plan:   1. Acute kidney Injury on chronic kidney disease stage IIIa: Appears to be hemodynamically mediated in the setting of hypercalcemia and volume contraction in the setting of ACE inhibitor/HCTZ.  Nonoliguric overnight with 2.3 L urine output and intravenous fluids stopped earlier today with increasing work of breathing.  Will augment urine output with diuretics.  Renal function improving. 2.  Hypercalcemia: Improving with ongoing medical management/IV fluids.  The latter have been discontinued and I will augment urine output with loop diuretics. 3.  Hypokalemia: Secondary to IV fluids and forced diuresis.  Replace via oral route. 5.  Acute on chronic blood loss anemia: With Hemoccult positive stool for which she underwent EGD showing a single nonbleeding angioectasia treated with APC and prior treatment of duodenal ectasias with APC as well.  She also has an incidental finding of cirrhosis.  Subjective:   Reports to be having nausea intermittently since this morning and earlier was having some wheezing for which IV fluids were stopped.   Objective:   BP (!) 157/58 (BP Location: Left Arm)   Pulse 76   Temp 98.9 F (37.2 C) (Oral)   Resp 18   Ht 5' 2 (1.575 m)   Wt 101.8 kg   SpO2 95%   BMI 41.05 kg/m   Intake/Output Summary (Last 24 hours) at 03/09/2024 1256 Last data filed at 03/09/2024 1100 Gross per 24 hour  Intake 5262.45 ml  Output 2050 ml  Net 3212.45 ml   Weight change: 0 kg  Physical Exam: Gen: Comfortably resting in bed with husband and daughter at bedside. CVS: Pulse regular rhythm, normal rate, normal S1 and S2. Resp: Clear to auscultation bilaterally, no rales/rhonchi Abd: Soft, obese, nontender, bowel sounds normal Ext: Trace ankle edema  Imaging: DG CHEST PORT 1 VIEW Result Date: 03/09/2024 CLINICAL DATA:  Dyspnea EXAM:  PORTABLE CHEST 1 VIEW COMPARISON:  Prior chest x-ray 03/06/2024 FINDINGS: Stable cardiac and mediastinal contours. Slightly increased pulmonary vascular congestion with Kerley B-lines in the periphery consistent with mild interstitial pulmonary edema. Increased bibasilar airspace opacities favored to reflect a combination of atelectasis and perhaps small layering effusions. No focal airspace infiltrate. No pneumothorax. No acute osseous abnormality. Calcifications visualized along the thoracic aorta. IMPRESSION: 1. Increased pulmonary vascular congestion with mild interstitial edema. Findings suggest mild CHF. 2. Bibasilar airspace opacities favored to reflect small layering pleural effusions and atelectasis. Electronically Signed   By: Wilkie Lent M.D.   On: 03/09/2024 12:12   DG Bone Survey Met Result Date: 03/07/2024 CLINICAL DATA:  Hypercalcemia.  Bilateral lower leg pain. EXAM: METASTATIC BONE SURVEY COMPARISON:  None Available. FINDINGS: Lateral skull radiograph: No focal lytic lesion. No depressed fracture of the cranial vault. Probable embolization coil noted overlying the sellar region. Bilateral shoulders: No focal lytic lesion. Mild degenerative changes of bilateral glenohumeral and acromioclavicular joints. Bilateral humeri: No focal lytic lesion. No acute fracture or dislocation. Bilateral forearm: No focal lytic lesion. No acute fracture or dislocation. Cervical spine: No focal lytic lesion. Normal cervical spinal curvature. Mild-to-moderate multilevel degenerative changes noted, most pronounced at C3-4 and C5-6 levels. Thoracic spine: No focal lytic lesion. Normal thoracic curvature. Mild-to-moderate multilevel degenerative changes noted. No acute vertebral compression fracture. Lumbar spine: No focal lytic lesion. Normal lumbar lordosis. No acute compression fracture. Mild multilevel degenerative changes characterized by facet arthropathy and marginal osteophyte formation. Pelvis: No focal  lytic lesion. No acute fracture or dislocation. Sacral arcuate lines are intact. Changes of chronic pubic symphysisitis. Mild degenerative changes of bilateral hip joints without significant joint space narrowing. Bilateral femur: No focal lytic lesion. No acute fracture or dislocation. Bilateral legs: No focal lytic lesion. No acute fracture or dislocation. Bilateral ankle mortises are intact. Chest: No focal lytic lesion. Bilateral lungs are clear. Bilateral lateral costophrenic angle are clear. Normal cardiomediastinal silhouette. IMPRESSION: *No focal lytic lesion seen. Multiple chronic findings, as described above. Electronically Signed   By: Ree Molt M.D.   On: 03/07/2024 17:04    Labs: BMET Recent Labs  Lab 03/04/24 1056 03/06/24 1106 03/06/24 2046 03/07/24 0554 03/08/24 0636 03/09/24 0530  NA 136 137 141 144 144 145  K 3.7 3.5 3.2* 3.2* 3.5 3.0*  CL 94* 96* 102 108 110 112*  CO2 26 26 27 24 22  20*  GLUCOSE 101* 92 96 86 99 92  BUN 61* 58* 65* 59* 43* 31*  CREATININE 5.15* 6.00* 5.10* 5.01* 4.07* 3.15*  CALCIUM  17.0* >15.0* >15.0* 14.2* 13.1* 11.3*  PHOS  --  3.4  --   --  2.7 2.2*   CBC Recent Labs  Lab 03/04/24 1056 03/06/24 1106 03/07/24 0554 03/08/24 0636 03/09/24 0530  WBC 12.2* 10.4 10.4 9.9 8.2  NEUTROABS 6.7 6.0  --   --   --   HGB 9.9* 10.0* 8.6* 8.0* 7.2*  HCT 30.1* 30.5* 26.6* 25.9* 22.8*  MCV 101* 98.7 101.5* 104.9* 101.3*  PLT 282 276 230 205 173    Medications:     arformoterol   15 mcg Nebulization BID   budesonide  (PULMICORT ) nebulizer solution  0.25 mg Nebulization BID   Chlorhexidine  Gluconate Cloth  6 each Topical Daily   feeding supplement  237 mL Oral BID BM   fluticasone   1 spray Each Nare Daily   folic acid   1 mg Oral Daily   Influenza vac split trivalent PF  0.5 mL Intramuscular Tomorrow-1000   ipratropium-albuterol   3 mL Nebulization Q6H   multivitamin with minerals  1 tablet Oral Daily   pantoprazole   40 mg Oral BID   thiamine    100 mg Oral Daily   Or   thiamine   100 mg Intravenous Daily    Gordy Blanch, MD 03/09/2024, 12:56 PM

## 2024-03-10 ENCOUNTER — Inpatient Hospital Stay (HOSPITAL_COMMUNITY)

## 2024-03-10 LAB — CBC
HCT: 22.2 % — ABNORMAL LOW (ref 36.0–46.0)
Hemoglobin: 7.2 g/dL — ABNORMAL LOW (ref 12.0–15.0)
MCH: 32.7 pg (ref 26.0–34.0)
MCHC: 32.4 g/dL (ref 30.0–36.0)
MCV: 100.9 fL — ABNORMAL HIGH (ref 80.0–100.0)
Platelets: 215 K/uL (ref 150–400)
RBC: 2.2 MIL/uL — ABNORMAL LOW (ref 3.87–5.11)
RDW: 13.2 % (ref 11.5–15.5)
WBC: 9 K/uL (ref 4.0–10.5)
nRBC: 0 % (ref 0.0–0.2)

## 2024-03-10 LAB — BASIC METABOLIC PANEL WITH GFR
Anion gap: 13 (ref 5–15)
BUN: 26 mg/dL — ABNORMAL HIGH (ref 8–23)
CO2: 22 mmol/L (ref 22–32)
Calcium: 11 mg/dL — ABNORMAL HIGH (ref 8.9–10.3)
Chloride: 107 mmol/L (ref 98–111)
Creatinine, Ser: 2.86 mg/dL — ABNORMAL HIGH (ref 0.44–1.00)
GFR, Estimated: 17 mL/min — ABNORMAL LOW (ref >60)
Glucose, Bld: 93 mg/dL (ref 70–99)
Potassium: 3.4 mmol/L — ABNORMAL LOW (ref 3.5–5.1)
Sodium: 142 mmol/L (ref 135–145)

## 2024-03-10 LAB — PROTEIN ELECTROPHORESIS, SERUM
A/G Ratio: 1 (ref 0.7–1.7)
Albumin ELP: 3.1 g/dL (ref 2.9–4.4)
Alpha-1-Globulin: 0.2 g/dL (ref 0.0–0.4)
Alpha-2-Globulin: 0.7 g/dL (ref 0.4–1.0)
Beta Globulin: 1.2 g/dL (ref 0.7–1.3)
Gamma Globulin: 1 g/dL (ref 0.4–1.8)
Globulin, Total: 3.1 g/dL (ref 2.2–3.9)
Total Protein ELP: 6.2 g/dL (ref 6.0–8.5)

## 2024-03-10 LAB — RENAL FUNCTION PANEL
Albumin: 3.6 g/dL (ref 3.5–5.0)
Anion gap: 14 (ref 5–15)
BUN: 26 mg/dL — ABNORMAL HIGH (ref 8–23)
CO2: 21 mmol/L — ABNORMAL LOW (ref 22–32)
Calcium: 10.7 mg/dL — ABNORMAL HIGH (ref 8.9–10.3)
Chloride: 108 mmol/L (ref 98–111)
Creatinine, Ser: 2.87 mg/dL — ABNORMAL HIGH (ref 0.44–1.00)
GFR, Estimated: 17 mL/min — ABNORMAL LOW (ref >60)
Glucose, Bld: 91 mg/dL (ref 70–99)
Phosphorus: 2.1 mg/dL — ABNORMAL LOW (ref 2.5–4.6)
Potassium: 2.9 mmol/L — ABNORMAL LOW (ref 3.5–5.1)
Sodium: 143 mmol/L (ref 135–145)

## 2024-03-10 LAB — SURGICAL PATHOLOGY

## 2024-03-10 LAB — MAGNESIUM: Magnesium: 1.8 mg/dL (ref 1.7–2.4)

## 2024-03-10 LAB — CREATININE, URINE, 24 HOUR
Collection Interval-UCRE24: 24 h
Creatinine, 24H Ur: 1368 mg/d (ref 600–1800)
Creatinine, Urine: 38 mg/dL
Urine Total Volume-UCRE24: 3600 mL

## 2024-03-10 LAB — PREPARE RBC (CROSSMATCH)

## 2024-03-10 MED ORDER — POTASSIUM CHLORIDE CRYS ER 20 MEQ PO TBCR
40.0000 meq | EXTENDED_RELEASE_TABLET | Freq: Once | ORAL | Status: AC
  Administered 2024-03-10: 40 meq via ORAL
  Filled 2024-03-10: qty 2

## 2024-03-10 MED ORDER — SODIUM CHLORIDE 0.9% IV SOLUTION: Freq: Once | INTRAVENOUS | Status: AC

## 2024-03-10 MED ORDER — FUROSEMIDE 10 MG/ML IJ SOLN
20.0000 mg | Freq: Once | INTRAMUSCULAR | Status: DC
  Filled 2024-03-10: qty 2

## 2024-03-10 NOTE — Progress Notes (Signed)
 Patient ID: Marissa Willis, female   DOB: 27-Sep-1955, 68 y.o.   MRN: [id number]  [facility] Progress Note   Assessment/ Plan:   1. Acute kidney Injury on chronic kidney disease stage IIIa: Appears to be hemodynamically mediated in the setting of hypercalcemia and volume contraction in the setting of ACE inhibitor/HCTZ.  Nonoliguric overnight with 2 L output noted in the first half of the day (not charted from overnight).  Creatinine continues to trend down indicative of renal recovery. 2.  Hypercalcemia: Improving with ongoing medical management/IV fluids.  Calcium  level continues to improve/trend towards normal status post medical management.  Would avoid HCTZ in the future for her. 3.  Hypokalemia: Secondary to IV fluids and forced diuresis.  Will continue to replace via oral route and recheck labs again this evening along with magnesium  level. 5.  Acute on chronic blood loss anemia: With Hemoccult positive stool for which she underwent EGD showing a single nonbleeding angioectasia treated with APC and prior treatment of duodenal ectasias with APC as well.  She also has an incidental finding of cirrhosis.  Subjective:   Denies any acute events overnight, nausea as well as wheezing have both improved.   Objective:   BP (!) 145/55 (BP Location: Left Arm)   Pulse 78   Temp 99.3 F (37.4 C) (Oral)   Resp 18   Ht 5' 2 (1.575 m)   Wt 101.8 kg   SpO2 99%   BMI 41.05 kg/m   Intake/Output Summary (Last 24 hours) at 03/10/2024 1249 Last data filed at 03/10/2024 0900 Gross per 24 hour  Intake 240 ml  Output 2000 ml  Net -1760 ml   Weight change:   Physical Exam: Gen: Resting comfortably in bed, daughter at bedside CVS: Pulse regular rhythm, normal rate, normal S1 and S2. Resp: Clear to auscultation bilaterally, no rales/rhonchi Abd: Soft, obese, nontender, bowel sounds normal Ext: Trace ankle edema  Imaging: DG CHEST PORT 1 VIEW Result Date: 03/09/2024 CLINICAL DATA:   Dyspnea EXAM: PORTABLE CHEST 1 VIEW COMPARISON:  Prior chest x-ray 03/06/2024 FINDINGS: Stable cardiac and mediastinal contours. Slightly increased pulmonary vascular congestion with Kerley B-lines in the periphery consistent with mild interstitial pulmonary edema. Increased bibasilar airspace opacities favored to reflect a combination of atelectasis and perhaps small layering effusions. No focal airspace infiltrate. No pneumothorax. No acute osseous abnormality. Calcifications visualized along the thoracic aorta. IMPRESSION: 1. Increased pulmonary vascular congestion with mild interstitial edema. Findings suggest mild CHF. 2. Bibasilar airspace opacities favored to reflect small layering pleural effusions and atelectasis. Electronically Signed   By: Wilkie Lent M.D.   On: 03/09/2024 12:12    Labs: BMET Recent Labs  Lab 03/04/24 1056 03/06/24 1106 03/06/24 2046 03/07/24 0554 03/08/24 0636 03/09/24 0530 03/10/24 0544  NA 136 137 141 144 144 145 143  K 3.7 3.5 3.2* 3.2* 3.5 3.0* 2.9*  CL 94* 96* 102 108 110 112* 108  CO2 26 26 27 24 22  20* 21*  GLUCOSE 101* 92 96 86 99 92 91  BUN 61* 58* 65* 59* 43* 31* 26*  CREATININE 5.15* 6.00* 5.10* 5.01* 4.07* 3.15* 2.87*  CALCIUM  17.0* >15.0* >15.0* 14.2* 13.1* 11.3* 10.7*  PHOS  --  3.4  --   --  2.7 2.2* 2.1*   CBC Recent Labs  Lab 03/04/24 1056 03/06/24 1106 03/07/24 0554 03/08/24 0636 03/09/24 0530 03/09/24 1450 03/09/24 1925 03/10/24 0544  WBC 12.2* 10.4   < > 9.9 8.2 9.0  --  9.0  NEUTROABS  6.7 6.0  --   --   --   --   --   --   HGB 9.9* 10.0*   < > 8.0* 7.2* 7.5* 7.5* 7.2*  HCT 30.1* 30.5*   < > 25.9* 22.8* 23.4* 23.0* 22.2*  MCV 101* 98.7   < > 104.9* 101.3* 101.7*  --  100.9*  PLT 282 276   < > 205 173 207  --  215   < > = values in this interval not displayed.    Medications:     sodium chloride    Intravenous Once   arformoterol   15 mcg Nebulization BID   budesonide  (PULMICORT ) nebulizer solution  0.25 mg  Nebulization BID   Chlorhexidine  Gluconate Cloth  6 each Topical Daily   feeding supplement  237 mL Oral BID BM   fluticasone   1 spray Each Nare Daily   folic acid   1 mg Oral Daily   furosemide   20 mg Intravenous Once   Influenza vac split trivalent PF  0.5 mL Intramuscular Tomorrow-1000   ipratropium-albuterol   3 mL Nebulization Q6H   multivitamin with minerals  1 tablet Oral Daily   pantoprazole   40 mg Oral BID   thiamine   100 mg Oral Daily   Or   thiamine   100 mg Intravenous Daily    Gordy Blanch, MD 03/10/2024, 12:49 PM

## 2024-03-10 NOTE — Plan of Care (Signed)
   Problem: Education: Goal: Knowledge of General Education information will improve Description Including pain rating scale, medication(s)/side effects and non-pharmacologic comfort measures Outcome: Progressing

## 2024-03-10 NOTE — Progress Notes (Signed)
 Subjective: No complaints.  Feeling well.  Melena decreased.  Objective: Vital signs in last 24 hours: Temp:  [99.2 F (37.3 C)-99.7 F (37.6 C)] 99.3 F (37.4 C) (09/23 0504) Pulse Rate:  [78-83] 78 (09/23 0504) Resp:  [17-20] 18 (09/23 0504) BP: (145-172)/(55-58) 145/55 (09/23 0504) SpO2:  [93 %-99 %] 99 % (09/23 0747) FiO2 (%):  [21 %] 21 % (09/23 0747) Last BM Date : 03/09/24  Intake/Output from previous day: 09/22 0701 - 09/23 0700 In: 5042.5 [P.O.:120; I.V.:4922.5] Out: 2000 [Urine:2000] Intake/Output this shift: Total I/O In: 240 [P.O.:240] Out: -   General appearance: alert and no distress GI: soft, non-tender; bowel sounds normal; no masses,  no organomegaly  Lab Results: Recent Labs    03/09/24 0530 03/09/24 1450 03/09/24 1925 03/10/24 0544  WBC 8.2 9.0  --  9.0  HGB 7.2* 7.5* 7.5* 7.2*  HCT 22.8* 23.4* 23.0* 22.2*  PLT 173 207  --  215   BMET Recent Labs    03/08/24 0636 03/09/24 0530 03/10/24 0544  NA 144 145 143  K 3.5 3.0* 2.9*  CL 110 112* 108  CO2 22 20* 21*  GLUCOSE 99 92 91  BUN 43* 31* 26*  CREATININE 4.07* 3.15* 2.87*  CALCIUM  13.1* 11.3* 10.7*   LFT Recent Labs    03/10/24 0544  ALBUMIN  3.6   PT/INR No results for input(s): LABPROT, INR in the last 72 hours. Hepatitis Panel No results for input(s): HEPBSAG, HCVAB, HEPAIGM, HEPBIGM in the last 72 hours. C-Diff No results for input(s): CDIFFTOX in the last 72 hours. Fecal Lactopherrin No results for input(s): FECLLACTOFRN in the last 72 hours.  Studies/Results: DG CHEST PORT 1 VIEW Result Date: 03/09/2024 CLINICAL DATA:  Dyspnea EXAM: PORTABLE CHEST 1 VIEW COMPARISON:  Prior chest x-ray 03/06/2024 FINDINGS: Stable cardiac and mediastinal contours. Slightly increased pulmonary vascular congestion with Kerley B-lines in the periphery consistent with mild interstitial pulmonary edema. Increased bibasilar airspace opacities favored to reflect a combination of  atelectasis and perhaps small layering effusions. No focal airspace infiltrate. No pneumothorax. No acute osseous abnormality. Calcifications visualized along the thoracic aorta. IMPRESSION: 1. Increased pulmonary vascular congestion with mild interstitial edema. Findings suggest mild CHF. 2. Bibasilar airspace opacities favored to reflect small layering pleural effusions and atelectasis. Electronically Signed   By: Wilkie Lent M.D.   On: 03/09/2024 12:12    Medications: Scheduled:  sodium chloride    Intravenous Once   arformoterol   15 mcg Nebulization BID   budesonide  (PULMICORT ) nebulizer solution  0.25 mg Nebulization BID   Chlorhexidine  Gluconate Cloth  6 each Topical Daily   feeding supplement  237 mL Oral BID BM   fluticasone   1 spray Each Nare Daily   folic acid   1 mg Oral Daily   furosemide   20 mg Intravenous Once   Influenza vac split trivalent PF  0.5 mL Intramuscular Tomorrow-1000   ipratropium-albuterol   3 mL Nebulization Q6H   multivitamin with minerals  1 tablet Oral Daily   pantoprazole   40 mg Oral BID   thiamine   100 mg Oral Daily   Or   thiamine   100 mg Intravenous Daily   Continuous:  Assessment/Plan: 1) Bleeding AVM s/p APC. 2) Anemia - stable. 3) AKI - improving.   The patient reports that there was a decline in her melena s/p APC of the AVMs.  Her HGB remains stable, overall.  From the GI standpoint, no further evaluation is required.  Plan: 1) Continue follow HGB and transfuse if necessary.  2) Upon discharge the patient can follow up with her primary GI at [facility]. 3) Signing off.  LOS: 3 days   Axil Copeman D 03/10/2024, 1:13 PM

## 2024-03-10 NOTE — Progress Notes (Signed)
 PROGRESS NOTE    Marissa Willis  FMW:[id number] DOB: March 26, 1956 DOA: 03/06/2024 PCP: Antoniette Vermell CROME, PA-C   Brief Narrative: 68 year old with past medical history significant for asthma, COPD, complex partial seizure disorder, GERD, memory changes, pulmonary nodules, prediabetes, mitral valve regurgitation, nephrolithiasis, history of retained ureteral stents, cerebral aneurysmal repair, hyperlipidemia, OSA on CPAP, stress urinary incontinence, vitamin D  deficiency, class II obesity, liver cirrhosis who was sent from her PCP office due to AKI and hypercalcemia.  Patient takes hydrochlorothiazide  vitamin D  and calcium  supplements.  She has been feeling very weak and having trouble walking from 1 room to another.  Evaluation in the ED hemoglobin 10, lipase 107, proBNP 192, BUN 58, creatinine 6, calcium  greater than 15.  Mild transaminases.  Chest x-ray no acute cardiopulmonary process, CT abdomen and pelvis with contrast done 2 days prior to admission showed hepatic asteatosis and nodular hepatic contour suggesting hepatic cirrhosis diverticulosis.  Patient presented with hypercalcemia and AKI.  Also she had GI bleed presenting with melena.  Underwent endoscopy with cauterization of AVM.  For AKI and hypercalcemia she received IV fluids, calcitonin.  Calcium  level normalizing.  Renal function improving.  IV fluid discontinued on 9/20-second due to pulmonary edema.  She was started on IV Lasix .    Assessment & Plan:   Principal Problem:   Hypercalcemia Active Problems:   Mixed hyperlipidemia   Aortic atherosclerosis   Hepatic steatosis   Benign essential hypertension   AKI (acute kidney injury)   Hypermagnesemia   Heme positive stool   Transaminitis   Chronic blood loss anemia  1-AKI;  In setting hypercalcemia, volume depletion, and ACE.  Patient presented with a creatinine of 6, BUN 60 CK level 323, Renal US :  No acute findings. Suspected chronic medical renal disease. Treated   with aggressive IV fluids.  MM: Protein electrophoresis M spike negative.  UPEP SPEP 24 hours: Pending Bone survey negative.  Function continue to improve, creatinine down to 2.8 On IV Lasix , to promote diuresis and help with pulmonary edema Appreciate nephrology assistance.  2-Hypercalcemia: Could be related to primary hyperparathyroidism, thyroid  ultrasound ordered to look for parathyroid  adenoma. Vitamin D  53 Received calcitonin Treated  with IV fluids Received a dose of zoledronic  acid She denies history of malignancy.  MM panel pending.  bone survey negative for lytic  lesions.  PTH: 31; not significantly suppressed by the degree of hypercalcemia.  She might have primary hyperparathyroidism.  Thyroid  ultrasound ordered to rule out parathyroid  adenoma.   -PTH like peptide pending.  - Protein electrophoresis: Negative for M Spike.  -Ca down to 10.  - I have made referral to outpatient endocrinologist, Dr. Faythe.  She has an appointment on October 3 at 2 PM  Acute on chronic blood loss anemia Heme Positive stool -GI consulted.  - Underwent endoscopy on 9/21: Showed erythematous mucosa in the stomach, a single nonbleeding angioectasia in the stomach treated with argon plasma coagulation, multiple recent bleeding and he will ectasis in the duodenum treated with argon plasma. -IV Protonix  IV BID.  -CBC at 7, will proceed with one unit PRBC>   Dyspnea; Acute pulmonary edema.  In setting IV fluids.  Schedule nebulizer.  Stop IV fluids.  Chest x ray pulmonary edema.  Continue with IV lasix .   Hyperlipidemia - Holding statin for now  Transaminases Hepatic steatosis, cirrhosis: - Continue to monitor liver function test, - Cirrhosis by imaging  Hypertension - Holding hydrochlorothiazide  and lisinopril  in the setting of AKI and hypercalcemia  Hypermagnesemia Monitor,  on IV fluid  Asthma/COPD: Continue with nebulizer Alcohol use;  monitor on CIWA Hypokalemia; replete orally    Estimated body mass index is 41.05 kg/m as calculated from the following:   Height as of this encounter: 5' 2 (1.575 m).   Weight as of this encounter: 101.8 kg.   DVT prophylaxis: SCDs Code Status: Full code Family Communication: Husband who was at bedside Disposition Plan:  Status is: Observation The patient will require care spanning > 2 midnights and should be moved to inpatient because: Management of hypercalcemia and AKI    Consultants:  Nephrology GI  Procedures:  Renal ultrasound  Antimicrobials:    Subjective: She is feeling better than yesterday, she is breathing better.  Less wheezing.  She continued to have black dark stool.  Objective: Vitals:   03/10/24 0700 03/10/24 0746 03/10/24 0747 03/10/24 1408  BP:    (!) 136/49  Pulse:    74  Resp:    16  Temp:    99.9 F (37.7 C)  TempSrc:    Oral  SpO2: 98% 98% 99% 92%  Weight:      Height:        Intake/Output Summary (Last 24 hours) at 03/10/2024 1447 Last data filed at 03/10/2024 0900 Gross per 24 hour  Intake 240 ml  Output 2000 ml  Net -1760 ml   Filed Weights   03/08/24 0500 03/08/24 0851  Weight: 101.8 kg 101.8 kg    Examination:  General exam; NAD Respiratory system: Normal respiratory effort, crackles at the bases. Cardiovascular system: S1-S2 regular rhythm and rate Gastrointestinal system: BS present, soft, nt Central nervous system: Alert, follows command Extremities: trace edema    Data Reviewed: I have personally reviewed following labs and imaging studies  CBC: Recent Labs  Lab 03/04/24 1056 03/06/24 1106 03/06/24 1106 03/07/24 0554 03/08/24 0636 03/09/24 0530 03/09/24 1450 03/09/24 1925 03/10/24 0544  WBC 12.2* 10.4   < > 10.4 9.9 8.2 9.0  --  9.0  NEUTROABS 6.7 6.0  --   --   --   --   --   --   --   HGB 9.9* 10.0*   < > 8.6* 8.0* 7.2* 7.5* 7.5* 7.2*  HCT 30.1* 30.5*   < > 26.6* 25.9* 22.8* 23.4* 23.0* 22.2*  MCV 101* 98.7   < > 101.5* 104.9* 101.3* 101.7*   --  100.9*  PLT 282 276   < > 230 205 173 207  --  215   < > = values in this interval not displayed.   Basic Metabolic Panel: Recent Labs  Lab 03/06/24 1106 03/06/24 2046 03/07/24 0554 03/08/24 0636 03/09/24 0530 03/10/24 0544  NA 137 141 144 144 145 143  K 3.5 3.2* 3.2* 3.5 3.0* 2.9*  CL 96* 102 108 110 112* 108  CO2 26 27 24 22  20* 21*  GLUCOSE 92 96 86 99 92 91  BUN 58* 65* 59* 43* 31* 26*  CREATININE 6.00* 5.10* 5.01* 4.07* 3.15* 2.87*  CALCIUM  >15.0* >15.0* 14.2* 13.1* 11.3* 10.7*  MG 4.3*  --   --   --  1.7  --   PHOS 3.4  --   --  2.7 2.2* 2.1*   GFR: Estimated Creatinine Clearance: 20.4 mL/min (A) (by C-G formula based on SCr of 2.87 mg/dL (H)). Liver Function Tests: Recent Labs  Lab 03/04/24 1056 03/06/24 1106 03/06/24 1106 03/06/24 2046 03/07/24 0554 03/08/24 0636 03/09/24 0530 03/10/24 0544  AST 72* 86*  --   --  66*  --   --   --   ALT 62* 78*  --   --  61*  --   --   --   ALKPHOS 78 82  --   --  84  --   --   --   BILITOT 0.4 0.3  --   --  0.4  --   --   --   PROT 7.5 7.9  --   --  6.5  --   --   --   ALBUMIN  4.5 4.5   < > 3.9 3.8 3.7 3.5 3.6   < > = values in this interval not displayed.   Recent Labs  Lab 03/04/24 1056 03/06/24 1106  LIPASE 113* 107*   No results for input(s): AMMONIA in the last 168 hours. Coagulation Profile: No results for input(s): INR, PROTIME in the last 168 hours. Cardiac Enzymes: Recent Labs  Lab 03/06/24 1106  CKTOTAL 323*   BNP (last 3 results) Recent Labs    03/06/24 1106  PROBNP 192.0   HbA1C: No results for input(s): HGBA1C in the last 72 hours. CBG: No results for input(s): GLUCAP in the last 168 hours. Lipid Profile: No results for input(s): CHOL, HDL, LDLCALC, TRIG, CHOLHDL, LDLDIRECT in the last 72 hours. Thyroid  Function Tests: No results for input(s): TSH, T4TOTAL, FREET4, T3FREE, THYROIDAB in the last 72 hours.  Anemia Panel: No results for input(s):  VITAMINB12, FOLATE, FERRITIN, TIBC, IRON, RETICCTPCT in the last 72 hours.  Sepsis Labs: No results for input(s): PROCALCITON, LATICACIDVEN in the last 168 hours.  Recent Results (from the past 240 hours)  Urine Culture     Status: None   Collection Time: 03/04/24 11:56 AM   Specimen: Urine   Urine  Result Value Ref Range Status   Urine Culture, Routine Final report  Final   Organism ID, Bacteria No growth  Final         Radiology Studies: DG CHEST PORT 1 VIEW Result Date: 03/09/2024 CLINICAL DATA:  Dyspnea EXAM: PORTABLE CHEST 1 VIEW COMPARISON:  Prior chest x-ray 03/06/2024 FINDINGS: Stable cardiac and mediastinal contours. Slightly increased pulmonary vascular congestion with Kerley B-lines in the periphery consistent with mild interstitial pulmonary edema. Increased bibasilar airspace opacities favored to reflect a combination of atelectasis and perhaps small layering effusions. No focal airspace infiltrate. No pneumothorax. No acute osseous abnormality. Calcifications visualized along the thoracic aorta. IMPRESSION: 1. Increased pulmonary vascular congestion with mild interstitial edema. Findings suggest mild CHF. 2. Bibasilar airspace opacities favored to reflect small layering pleural effusions and atelectasis. Electronically Signed   By: Wilkie Lent M.D.   On: 03/09/2024 12:12        Scheduled Meds:  sodium chloride    Intravenous Once   arformoterol   15 mcg Nebulization BID   budesonide  (PULMICORT ) nebulizer solution  0.25 mg Nebulization BID   Chlorhexidine  Gluconate Cloth  6 each Topical Daily   feeding supplement  237 mL Oral BID BM   fluticasone   1 spray Each Nare Daily   folic acid   1 mg Oral Daily   furosemide   20 mg Intravenous Once   Influenza vac split trivalent PF  0.5 mL Intramuscular Tomorrow-1000   ipratropium-albuterol   3 mL Nebulization Q6H   multivitamin with minerals  1 tablet Oral Daily   pantoprazole   40 mg Oral BID   thiamine    100 mg Oral Daily   Or   thiamine   100 mg Intravenous Daily   Continuous Infusions:  LOS: 3 days    Time spent: 35 Minutes    Audry Pecina A Erynne Kealey, MD [facility]   If 7PM-7AM, please contact night-coverage [email or web address]  03/10/2024, 2:47 PM

## 2024-03-10 NOTE — Progress Notes (Signed)
   03/10/24 2108  BiPAP/CPAP/SIPAP  BiPAP/CPAP/SIPAP Pt Type Adult  BiPAP/CPAP/SIPAP Resmed (home)  Patient Home Machine Yes  Safety Check Completed by RT for Home Unit Yes, no issues noted  Patient Home Mask Yes  Patient Home Tubing Yes  Auto Titrate  (home settings)  Device Plugged into RED Power Outlet Yes

## 2024-03-11 ENCOUNTER — Inpatient Hospital Stay (HOSPITAL_COMMUNITY)

## 2024-03-11 DIAGNOSIS — N179 Acute kidney failure, unspecified: Secondary | ICD-10-CM | POA: Diagnosis not present

## 2024-03-11 LAB — CBC
HCT: 26.5 % — ABNORMAL LOW (ref 36.0–46.0)
Hemoglobin: 8.5 g/dL — ABNORMAL LOW (ref 12.0–15.0)
MCH: 31.5 pg (ref 26.0–34.0)
MCHC: 32.1 g/dL (ref 30.0–36.0)
MCV: 98.1 fL (ref 80.0–100.0)
Platelets: 245 K/uL (ref 150–400)
RBC: 2.7 MIL/uL — ABNORMAL LOW (ref 3.87–5.11)
RDW: 14.1 % (ref 11.5–15.5)
WBC: 8.4 K/uL (ref 4.0–10.5)
nRBC: 0 % (ref 0.0–0.2)

## 2024-03-11 LAB — TYPE AND SCREEN
ABO/RH(D): O POS
Antibody Screen: NEGATIVE
Unit division: 0

## 2024-03-11 LAB — BPAM RBC
Blood Product Expiration Date: 202510252359
ISSUE DATE / TIME: 202509231623
Unit Type and Rh: 5100

## 2024-03-11 LAB — RENAL FUNCTION PANEL
Albumin: 3.8 g/dL (ref 3.5–5.0)
Anion gap: 13 (ref 5–15)
BUN: 25 mg/dL — ABNORMAL HIGH (ref 8–23)
CO2: 22 mmol/L (ref 22–32)
Calcium: 11 mg/dL — ABNORMAL HIGH (ref 8.9–10.3)
Chloride: 108 mmol/L (ref 98–111)
Creatinine, Ser: 2.74 mg/dL — ABNORMAL HIGH (ref 0.44–1.00)
GFR, Estimated: 18 mL/min — ABNORMAL LOW (ref >60)
Glucose, Bld: 89 mg/dL (ref 70–99)
Phosphorus: 1.4 mg/dL — ABNORMAL LOW (ref 2.5–4.6)
Potassium: 3.1 mmol/L — ABNORMAL LOW (ref 3.5–5.1)
Sodium: 143 mmol/L (ref 135–145)

## 2024-03-11 LAB — LACTATE DEHYDROGENASE: LDH: 225 U/L — ABNORMAL HIGH (ref 98–192)

## 2024-03-11 MED ORDER — IPRATROPIUM-ALBUTEROL 0.5-2.5 (3) MG/3ML IN SOLN
3.0000 mL | Freq: Two times a day (BID) | RESPIRATORY_TRACT | Status: DC
  Administered 2024-03-11: 3 mL via RESPIRATORY_TRACT
  Filled 2024-03-11: qty 3

## 2024-03-11 MED ORDER — TECHNETIUM TC 99M SESTAMIBI - CARDIOLITE
25.3000 | Freq: Once | INTRAVENOUS | Status: AC
  Administered 2024-03-11: 25.3 via INTRAVENOUS

## 2024-03-11 MED ORDER — DICYCLOMINE HCL 10 MG PO CAPS
10.0000 mg | ORAL_CAPSULE | Freq: Three times a day (TID) | ORAL | Status: DC
  Administered 2024-03-11 – 2024-03-14 (×12): 10 mg via ORAL
  Filled 2024-03-11 (×12): qty 1

## 2024-03-11 MED ORDER — FUROSEMIDE 10 MG/ML IJ SOLN
80.0000 mg | Freq: Once | INTRAMUSCULAR | Status: AC
  Administered 2024-03-11: 80 mg via INTRAVENOUS
  Filled 2024-03-11 (×2): qty 8

## 2024-03-11 MED ORDER — CALCITONIN (SALMON) 200 UNIT/ML IJ SOLN: 400.0000 [IU] | Freq: Two times a day (BID) | INTRAMUSCULAR | Status: DC

## 2024-03-11 MED ORDER — POTASSIUM PHOSPHATES 15 MMOLE/5ML IV SOLN
30.0000 mmol | Freq: Once | INTRAVENOUS | Status: AC
  Administered 2024-03-11: 30 mmol via INTRAVENOUS
  Filled 2024-03-11: qty 10

## 2024-03-11 MED ORDER — ALBUMIN HUMAN 25 % IV SOLN
12.5000 g | Freq: Once | INTRAVENOUS | Status: AC
  Administered 2024-03-11: 12.5 g via INTRAVENOUS
  Filled 2024-03-11: qty 50

## 2024-03-11 MED ORDER — CALCITONIN (SALMON) 200 UNIT/ACT NA SOLN: 1.0000 | Freq: Every day | NASAL | Status: DC

## 2024-03-11 NOTE — Progress Notes (Signed)
 Patient ID: Marissa Willis, female   DOB: 1955/11/11, 68 y.o.   MRN: [id number] [facility] Progress Note   Assessment/ Plan:   1. Acute kidney Injury on chronic kidney disease stage IIIa: Appears to be hemodynamically mediated in the setting of hypercalcemia and volume contraction in the setting of ACE inhibitor/HCTZ.  She remains nonoliguric with slight decline of urine output and (slow but) gradually improving creatinine indicative of renal recovery that is underway.  Based on the presence of hypercalcemia, I will redose diuretic. 2.  Hypercalcemia: Improved with medical management earlier however appears to be trending up again.  I will give her albumin /Lasix  for forced diuresis today.  She has inappropriately normal PTH and I will order for a parathyroid  nuclear scan to evaluate for adenoma.  Previous SPEP negative for M spike. 3.  Hypokalemia: Secondary to IV fluids and forced diuresis.  Will continue to replace via oral route and recheck labs again this evening along with magnesium  level. 5.  Acute on chronic blood loss anemia: With Hemoccult positive stool for which she underwent EGD showing a single nonbleeding angioectasia treated with APC and prior treatment of duodenal ectasias with APC as well.  She also has an incidental finding of cirrhosis.  Subjective:   She complains of abdominal spasms since this morning   Objective:   BP (!) 147/52 (BP Location: Left Arm)   Pulse 75   Temp 99.1 F (37.3 C) (Oral)   Resp (!) 24   Ht 5' 2 (1.575 m)   Wt 97.7 kg   SpO2 95%   BMI 39.40 kg/m   Intake/Output Summary (Last 24 hours) at 03/11/2024 1246 Last data filed at 03/11/2024 1100 Gross per 24 hour  Intake 632.17 ml  Output 1400 ml  Net -767.83 ml   Weight change:   Physical Exam: Gen: Appears comfortable resting in bed, husband at bedside CVS: Pulse regular rhythm, normal rate, normal S1 and S2. Resp: Clear to auscultation bilaterally, no rales/rhonchi Abd:  Soft, obese, nontender, bowel sounds normal Ext: Trace ankle edema  Imaging: US  THYROID  Result Date: 03/11/2024 CLINICAL DATA:  Other.  2665 Hypercalcemia 2665 EXAM: THYROID  ULTRASOUND TECHNIQUE: Ultrasound examination of the thyroid  gland and adjacent soft tissues was performed. COMPARISON:  CT chest 08/19/2021. Carotid ultrasound duplex, 03/05/2013. FINDINGS: Parenchymal Echotexture: Mildly heterogenous Isthmus: 0.5 cm Right lobe: 3.8 x 1.8 x 2.1 cm Left lobe: 3.6 x 2.1 x 2.0 cm _________________________________________________________ Estimated total number of nodules >/= 1 cm: 2 Number of spongiform nodules >/=  2 cm not described below (TR1): 0 Number of mixed cystic and solid nodules >/= 1.5 cm not described below (TR2): 0 _________________________________________________________ Nodule # 2: Location: LEFT; Mid Maximum size: 1.0 cm; Other 2 dimensions: 1.0 x 0 8 cm Composition: solid/almost completely solid (2) Echogenicity: isoechoic (1) Shape: taller-than-wide (3) Margins: ill-defined (0) Echogenic foci: none (0) ACR TI-RADS total points: 6. ACR TI-RADS risk category: TR4 (4-6 points). ACR TI-RADS recommendations: *Given size (>/= 1 - 1.4 cm) and appearance, a follow-up ultrasound in 1 year should be considered based on TI-RADS criteria. _________________________________________________________ Nodule # 4: Location: LEFT; Inferior Maximum size: 1.4 cm; Other 2 dimensions: 0.8 x 0.8 cm Composition: solid/almost completely solid (2) Echogenicity: cannot determine (1) Shape: not taller-than-wide (0) Margins: ill-defined (0) Echogenic foci: macrocalcifications (1) ACR TI-RADS total points: 4. ACR TI-RADS risk category: TR4 (4-6 points). ACR TI-RADS recommendations: *Given size (>/= 1 - 1.4 cm) and appearance, a follow-up ultrasound in 1 year should be considered based  on TI-RADS criteria. _________________________________________________________ Additional sub-1 cm solid and cystic nodules scattered within  the gland do not meet threshold for follow-up nor biopsy per current criteria. No cervical adenopathy or abnormal fluid collection within the imaged neck. This examination was not protocoled for, nor were any parathyroid  glands seen or evaluated. IMPRESSION: 1. Nonenlarged, mildly heterogeneous and multinodular thyroid  gland. 2. 1.0 cm mid and 1.4 cm LEFT inferior TR-4 thyroid  nodules. A follow-up ultrasound in 1 year should be considered based on TI-RADS criteria. 3. This examination was not protocoled for, nor were any parathyroid  glands seen or evaluated. The above is in keeping with the ACR TI-RADS recommendations - J Am Coll Radiol 2017;14:587-595. Electronically Signed   By: Thom Hall M.D.   On: 03/11/2024 07:08    Labs: BMET Recent Labs  Lab 03/06/24 1106 03/06/24 2046 03/07/24 0554 03/08/24 0636 03/09/24 0530 03/10/24 0544 03/10/24 1524 03/11/24 0545  NA 137 141 144 144 145 143 142 143  K 3.5 3.2* 3.2* 3.5 3.0* 2.9* 3.4* 3.1*  CL 96* 102 108 110 112* 108 107 108  CO2 26 27 24 22  20* 21* 22 22  GLUCOSE 92 96 86 99 92 91 93 89  BUN 58* 65* 59* 43* 31* 26* 26* 25*  CREATININE 6.00* 5.10* 5.01* 4.07* 3.15* 2.87* 2.86* 2.74*  CALCIUM  >15.0* >15.0* 14.2* 13.1* 11.3* 10.7* 11.0* 11.0*  PHOS 3.4  --   --  2.7 2.2* 2.1*  --  1.4*   CBC Recent Labs  Lab 03/06/24 1106 03/07/24 0554 03/09/24 0530 03/09/24 1450 03/09/24 1925 03/10/24 0544 03/11/24 0545  WBC 10.4   < > 8.2 9.0  --  9.0 8.4  NEUTROABS 6.0  --   --   --   --   --   --   HGB 10.0*   < > 7.2* 7.5* 7.5* 7.2* 8.5*  HCT 30.5*   < > 22.8* 23.4* 23.0* 22.2* 26.5*  MCV 98.7   < > 101.3* 101.7*  --  100.9* 98.1  PLT 276   < > 173 207  --  215 245   < > = values in this interval not displayed.    Medications:     arformoterol   15 mcg Nebulization BID   budesonide  (PULMICORT ) nebulizer solution  0.25 mg Nebulization BID   Chlorhexidine  Gluconate Cloth  6 each Topical Daily   feeding supplement  237 mL Oral BID BM    fluticasone   1 spray Each Nare Daily   folic acid   1 mg Oral Daily   furosemide   20 mg Intravenous Once   Influenza vac split trivalent PF  0.5 mL Intramuscular Tomorrow-1000   ipratropium-albuterol   3 mL Nebulization BID   multivitamin with minerals  1 tablet Oral Daily   pantoprazole   40 mg Oral BID   thiamine   100 mg Oral Daily   Or   thiamine   100 mg Intravenous Daily    Gordy Blanch, MD 03/11/2024, 12:46 PM

## 2024-03-11 NOTE — Plan of Care (Signed)
   Problem: Activity: Goal: Risk for activity intolerance will decrease Outcome: Progressing   Problem: Coping: Goal: Level of anxiety will decrease Outcome: Progressing   Problem: Elimination: Goal: Will not experience complications related to bowel motility Outcome: Progressing   Problem: Safety: Goal: Ability to remain free from injury will improve Outcome: Progressing

## 2024-03-11 NOTE — Progress Notes (Signed)
 Progress Note   Patient: Marissa Willis FMW:[id number] DOB: 05/18/1956 DOA: 03/06/2024     4 DOS: the patient was seen and examined on 03/11/2024   Brief hospital course: 68yo with h/o COPD, complex partial seizure d/o, MCI, preDM, HLD, OSA on CPAP, class 2 obesity, and cirrhosis who presented on 9/19 from her PCP office with AKI and hypercalcemia.  She was treated with IVF and calcitonin with improvement but developed pulmonary edema and so IVF was stopped and she was given Lasix .  Also with GI bleeding and underwent EGD with AVM cauterization.  Assessment and Plan:  AKI In setting hypercalcemia, volume depletion, and ACE + HCTZ Patient presented with a creatinine of 6, BUN 60 Renal US :  No acute findings. Suspected chronic medical renal disease. Treated  with aggressive IV fluids MM: Protein electrophoresis M spike negative.  UPEP pending  Bone survey negative Function continues to very slowly improve, creatinine down to 2.74 On IV Lasix  to promote diuresis and help with pulmonary edema Appreciate nephrology assistance   Hypercalcemia Could be related to primary hyperparathyroidism Thyroid  ultrasound showed multinodular thyroid  Parathyroid  scan ordered to assess for parathyroid  adenoma Received calcitonin with good response, will reorder Treated with IV fluids but developed volume overload Received a dose of zoledronic  acid Bone survey negative for lytic  lesions.  Protein electrophoresis: Negative for M Spike.  Referral to outpatient endocrinologist, Dr. Faythe - appointment on October 3 at 2 PM   Acute on chronic blood loss anemia Heme Positive stool GI consulted Underwent endoscopy on 9/21 with erythematous mucosa in the stomach, a single nonbleeding angioectasia in the stomach treated with argon plasma coagulation, multiple recent bleeding angioectasias in the duodenum treated with argon plasma coagulation Continue IV Protonix  IV BID Transfused 1 unit PRBC on 9/24 for Hgb  7.2 with appropriate increase Continue to follow   Acute pulmonary edema Developed dyspnea in the setting of IV fluids Fluids stopped  Treated with IV lasix    Hyperlipidemia Holding statin for now   Hepatic steatosis, cirrhosis Mildly elevated LFTs Cirrhosis by imaging Will trend   Hypertension Holding hydrochlorothiazide  and lisinopril  in the setting of AKI and hypercalcemia Reasonable BP control, 147/52   Hypophosphatemia Repleted Recheck in AM   Asthma/COPD Continue with nebulizer  Alcohol use Monitor on CIWA  Hypokalemia Replete and follow    Consultants: Nephrology GI TOC team  Procedures: EGD 9/21  Antibiotics: None  30 Day Unplanned Readmission Risk Score    Flowsheet Row ED to Hosp-Admission (Current) from 03/06/2024 in [facility]  30 Day Unplanned Readmission Risk Score (%) 15.23 Filed at 03/11/2024 0400    This score is the patient's risk of an unplanned readmission within 30 days of being discharged (0 -100%). The score is based on dignosis, age, lab data, medications, orders, and past utilization.   Low:  0-14.9   Medium: 15-21.9   High: 22-29.9   Extreme: 30 and above           Subjective: Worried about abnormal labs but not feeling particularly bad.   Objective: Vitals:   03/11/24 0438 03/11/24 0805  BP: (!) 147/52   Pulse: 75   Resp: (!) 24   Temp: 99.1 F (37.3 C)   SpO2: 94% 95%    Intake/Output Summary (Last 24 hours) at 03/11/2024 1706 Last data filed at 03/11/2024 1100 Gross per 24 hour  Intake 630 ml  Output 1400 ml  Net -770 ml   [employer]  03/08/24 0500 03/08/24 0851 03/11/24 0438  Weight: 101.8 kg 101.8 kg 97.7 kg    Exam:  General:  Appears calm and comfortable and is in NAD Eyes:  normal lids, iris ENT:  grossly normal hearing, lips & tongue, mmm Cardiovascular:  RRR. No LE edema.  Respiratory:   CTA bilaterally with no wheezes/rales/rhonchi.  Normal  respiratory effort. Abdomen:  soft, NT, ND Skin:  no rash or induration seen on limited exam Musculoskeletal:  grossly normal tone BUE/BLE, good ROM, no bony abnormality Psychiatric:  grossly normal mood and affect, speech fluent and appropriate, AOx3 Neurologic:  CN 2-12 grossly intact, moves all extremities in coordinated fashion  Data Reviewed: I have reviewed the patient's lab results since admission.  Pertinent labs for today include:   K+ 3.1 BUN 25/Creatinine 2.74/GFR 18, slowly improving Calcium  11 Phos 1.4 WBC 8.4 Hgb 8.5, improved    Family Communication: Husband was present throughout evaluation      Code Status: Full Code  Barriers to discharge:  Persistent  hypercalcemia  Disposition: Status is: Inpatient Remains inpatient appropriate because: ongoing management     Time spent: 50 minutes  Unresulted Labs (From admission, onward)     Start     Ordered   03/12/24 0500  Renal function panel  Daily,   R      03/11/24 1258   03/12/24 0500  CBC with Differential/Platelet  Tomorrow morning,   R        03/11/24 1703   03/12/24 0500  Hepatic function panel  Tomorrow morning,   R        03/11/24 1703   03/11/24 1655  Lactate dehydrogenase  Add-on,   AD        03/11/24 1654   03/10/24 1820  Calcium , urine, 24 hour  Once,   R        03/10/24 1820   03/08/24 0741  PTH-related peptide  Once,   R        03/08/24 0740   03/06/24 1743  UPEP/UIFE/Light Chains/TP, 24-Hr Ur  Once,   R        03/06/24 1743             Author: Delon Herald, MD 03/11/2024 5:06 PM  For on call review [email or web address].

## 2024-03-11 NOTE — Progress Notes (Signed)
 Mobility Specialist - Progress Note   Pre-mobility: 93% SpO2 During mobility: 92% SpO2 Post-mobility: 93% SPO2    03/11/24 1300  Mobility  Activity Pivoted/transferred from bed to chair  Level of Assistance Standby assist, set-up cues, supervision of patient - no hands on  Assistive Device Front wheel walker  Distance Ambulated (ft) 3 ft  Range of Motion/Exercises Active  Activity Response Tolerated well  Mobility Referral Yes  Mobility visit 1 Mobility  Mobility Specialist Start Time (ACUTE ONLY) 1330  Mobility Specialist Stop Time (ACUTE ONLY) 1340  Mobility Specialist Time Calculation (min) (ACUTE ONLY) 10 min    Pt was received in bed and agreed to mobility. Politely declined ambulation due to feeling unwell. Returned to chair with all needs met. Left with RN in room.   [employer] - Mobility Specialist

## 2024-03-11 NOTE — Hospital Course (Signed)
 68yo with h/o COPD, complex partial seizure d/o, MCI, preDM, HLD, OSA on CPAP, class 2 obesity, and cirrhosis who presented on 9/19 from her PCP office with AKI and hypercalcemia.  She was treated with IVF and calcitonin with improvement but developed pulmonary edema and so IVF was stopped and she was given Lasix .  Also with GI bleeding and underwent EGD with AVM cauterization.

## 2024-03-11 NOTE — Progress Notes (Signed)
   03/11/24 2153  BiPAP/CPAP/SIPAP  BiPAP/CPAP/SIPAP Pt Type Adult  BiPAP/CPAP/SIPAP Resmed  Mask Type Nasal mask  Dentures removed? Not applicable  FiO2 (%) 21 %  Safety Check Completed by RT for Home Unit Yes, no issues noted  Patient Home Mask Yes  Patient Home Tubing Yes  Auto Titrate  (home settings)  Device Plugged into RED Power Outlet Yes

## 2024-03-11 NOTE — Plan of Care (Signed)

## 2024-03-12 DIAGNOSIS — N179 Acute kidney failure, unspecified: Secondary | ICD-10-CM | POA: Diagnosis not present

## 2024-03-12 LAB — HEPATIC FUNCTION PANEL
ALT: 35 U/L (ref 0–44)
AST: 47 U/L — ABNORMAL HIGH (ref 15–41)
Albumin: 4 g/dL (ref 3.5–5.0)
Alkaline Phosphatase: 169 U/L — ABNORMAL HIGH (ref 38–126)
Bilirubin, Direct: 0.3 mg/dL — ABNORMAL HIGH (ref 0.0–0.2)
Indirect Bilirubin: 0.4 mg/dL (ref 0.3–0.9)
Total Bilirubin: 0.8 mg/dL (ref 0.0–1.2)
Total Protein: 7.3 g/dL (ref 6.5–8.1)

## 2024-03-12 LAB — RENAL FUNCTION PANEL
Albumin: 4 g/dL (ref 3.5–5.0)
Anion gap: 17 — ABNORMAL HIGH (ref 5–15)
BUN: 25 mg/dL — ABNORMAL HIGH (ref 8–23)
CO2: 21 mmol/L — ABNORMAL LOW (ref 22–32)
Calcium: 10.9 mg/dL — ABNORMAL HIGH (ref 8.9–10.3)
Chloride: 106 mmol/L (ref 98–111)
Creatinine, Ser: 2.73 mg/dL — ABNORMAL HIGH (ref 0.44–1.00)
GFR, Estimated: 18 mL/min — ABNORMAL LOW (ref >60)
Glucose, Bld: 91 mg/dL (ref 70–99)
Phosphorus: 2.2 mg/dL — ABNORMAL LOW (ref 2.5–4.6)
Potassium: 2.9 mmol/L — ABNORMAL LOW (ref 3.5–5.1)
Sodium: 143 mmol/L (ref 135–145)

## 2024-03-12 LAB — CBC WITH DIFFERENTIAL/PLATELET
Abs Immature Granulocytes: 0.06 K/uL (ref 0.00–0.07)
Basophils Absolute: 0.1 K/uL (ref 0.0–0.1)
Basophils Relative: 0 %
Eosinophils Absolute: 0.7 K/uL — ABNORMAL HIGH (ref 0.0–0.5)
Eosinophils Relative: 6 %
HCT: 26.9 % — ABNORMAL LOW (ref 36.0–46.0)
Hemoglobin: 9 g/dL — ABNORMAL LOW (ref 12.0–15.0)
Immature Granulocytes: 1 %
Lymphocytes Relative: 19 %
Lymphs Abs: 2.1 K/uL (ref 0.7–4.0)
MCH: 32.4 pg (ref 26.0–34.0)
MCHC: 33.5 g/dL (ref 30.0–36.0)
MCV: 96.8 fL (ref 80.0–100.0)
Monocytes Absolute: 0.8 K/uL (ref 0.1–1.0)
Monocytes Relative: 7 %
Neutro Abs: 7.7 K/uL (ref 1.7–7.7)
Neutrophils Relative %: 67 %
Platelets: 312 K/uL (ref 150–400)
RBC: 2.78 MIL/uL — ABNORMAL LOW (ref 3.87–5.11)
RDW: 13.9 % (ref 11.5–15.5)
WBC: 11.5 K/uL — ABNORMAL HIGH (ref 4.0–10.5)
nRBC: 0 % (ref 0.0–0.2)

## 2024-03-12 MED ORDER — POTASSIUM CHLORIDE CRYS ER 20 MEQ PO TBCR
40.0000 meq | EXTENDED_RELEASE_TABLET | Freq: Two times a day (BID) | ORAL | Status: AC
  Administered 2024-03-12 – 2024-03-13 (×3): 40 meq via ORAL
  Filled 2024-03-12 (×3): qty 2

## 2024-03-12 MED ORDER — POTASSIUM CHLORIDE CRYS ER 20 MEQ PO TBCR
40.0000 meq | EXTENDED_RELEASE_TABLET | Freq: Once | ORAL | Status: AC
Start: 2024-03-12 — End: 2024-03-12
  Administered 2024-03-12: 40 meq via ORAL
  Filled 2024-03-12: qty 2

## 2024-03-12 MED ORDER — K PHOS MONO-SOD PHOS DI & MONO 155-852-130 MG PO TABS
500.0000 mg | ORAL_TABLET | Freq: Every day | ORAL | Status: DC
  Administered 2024-03-12 – 2024-03-14 (×3): 500 mg via ORAL
  Filled 2024-03-12 (×3): qty 2

## 2024-03-12 MED ORDER — ALBUTEROL SULFATE (2.5 MG/3ML) 0.083% IN NEBU
2.5000 mg | INHALATION_SOLUTION | RESPIRATORY_TRACT | Status: DC | PRN
  Administered 2024-03-12 – 2024-03-13 (×2): 2.5 mg via RESPIRATORY_TRACT
  Filled 2024-03-12 (×2): qty 3

## 2024-03-12 NOTE — TOC Initial Note (Signed)
 Transition of Care [facility]) - Initial/Assessment Note    Patient Details  Name: Marissa Willis MRN: [id number] Date of Birth: [birth year]  Transition of [facility]) CM/SW Contact:    Sonda Manuella Quill, RN Phone Number: 03/12/2024, 5:26 PM  Clinical Narrative:                 [facility] consulted for SA counseling/education; spoke w/ pt and spouse Snow Peoples [phone]) in room; pt said she lives at home w/ her spouse; she plans to return at d/c; he will provide transportation; PCP/insurance verified; pt denied SDOH risks; she has Rollator and BSC; pt does not have HH services or home oxygen ; she politely declined resources for SA; awaiting PT/OT evals; TOC is following.  Expected Discharge Plan: Home/Self Care Barriers to Discharge: Continued Medical Work up   Patient Goals and CMS Choice Patient states their goals for this hospitalization and ongoing recovery are:: home CMS Medicare.gov Compare Post Acute Care list provided to:: Patient   [place] ownership interest in [facility].provided to:: Patient    Expected Discharge Plan and Services   Discharge Planning Services: CM Consult   Living arrangements for the past 2 months: Single Family Home                 DME Arranged: N/A DME Agency: NA       HH Arranged: NA HH Agency: NA        Prior Living Arrangements/Services Living arrangements for the past 2 months: Single Family Home Lives with:: Spouse Patient language and need for interpreter reviewed:: Yes Do you feel safe going back to the place where you live?: Yes      Need for Family Participation in Patient Care: Yes (Comment) Care giver support system in place?: Yes (comment) Current home services: DME (Rollator, BSC) Criminal Activity/Legal Involvement Pertinent to Current Situation/Hospitalization: No - Comment as needed  Activities of Daily Living   ADL Screening (condition at time of admission) Independently performs ADLs?: Yes  (appropriate for developmental age) Is the patient deaf or have difficulty hearing?: No Does the patient have difficulty seeing, even when wearing glasses/contacts?: Yes Does the patient have difficulty concentrating, remembering, or making decisions?: No  Permission Sought/Granted Permission sought to share information with : Case Manager Permission granted to share information with : Yes, Verbal Permission Granted  Share Information with NAME: Case Manager     Permission granted to share info w Relationship: Addison Freimuth (spouse) [phone]     Emotional Assessment Appearance:: Appears stated age Attitude/Demeanor/Rapport: Gracious Affect (typically observed): Accepting Orientation: : Oriented to Self, Oriented to Place, Oriented to  Time, Oriented to Situation Alcohol / Substance Use: Alcohol Use Psych Involvement: No (comment)  Admission diagnosis:  Hypercalcemia [E83.52] Acute kidney injury [N17.9] Patient Active Problem List   Diagnosis Date Noted   AKI (acute kidney injury) 03/06/2024   Elevated lipase 03/06/2024   Anemia 03/06/2024   Hypercalcemia 03/06/2024   Hypermagnesemia 03/06/2024   Heme positive stool 03/06/2024   Transaminitis 03/06/2024   Chronic blood loss anemia 03/06/2024   Palpitations 03/04/2024   Black tarry stools 03/04/2024   Dizziness 03/04/2024   Hypoxia 03/04/2024   Urinary frequency 03/04/2024   Abdominal guarding 03/04/2024   Gastroesophageal reflux disease with esophagitis without hemorrhage 03/04/2024   Cirrhosis of liver (HCC) 03/04/2024   Severe obstructive sleep apnea 02/07/2024   Mitral valve regurgitation 10/22/2023   No energy 09/24/2023   Pre-diabetes 09/24/2023   Class 3 severe obesity due to  excess calories with serious comorbidity and body mass index (BMI) of 40.0 to 44.9 in adult 09/24/2023   Vitamin D  insufficiency 09/23/2023   Cardiomegaly 09/17/2022   Arthralgia 08/31/2022   Pulmonary nodules 11/22/2021   Macular  degeneration of both eyes 11/22/2021   Serum calcium  elevated 08/14/2021   Elevated liver enzymes 08/14/2021   Chronic bilateral low back pain without sciatica 08/14/2021   Osteoporosis screening 08/14/2021   Dysfunction of both eustachian tubes 04/26/2021   Nasal congestion 04/26/2021   Claudication of both lower extremities 08/22/2020   Left ventricular hypertrophy 09/22/2019   Osteopenia 09/21/2019   Low serum vitamin B12 09/09/2019   Elevated fasting glucose 09/09/2019   SOB (shortness of breath) on exertion 09/09/2019   Memory changes 09/09/2019   Fatigue 09/09/2019   Age related osteoporosis 09/09/2019   Post-menopausal 09/09/2019   Adenomatous polyp of colon 09/08/2019   Benign essential hypertension 03/20/2019   Diverticulosis 03/04/2018   Cyst near tailbone 02/17/2018   Generalized intra-abdominal and pelvic swelling, mass and lump 02/17/2018   Polyp of sigmoid colon 02/17/2018   Internal hemorrhoids 02/17/2018   Hepatic steatosis 09/16/2017   Bilateral hip pain 09/11/2017   Epigastric pain 09/11/2017   RUQ pain 09/11/2017   Class 2 severe obesity due to excess calories with serious comorbidity and body mass index (BMI) of 35.0 to 35.9 in adult 09/11/2017   Finger fracture, right 08/24/2016   Kidney calculi 01/12/2016   Renal calculi 12/09/2015   Bilateral lower extremity edema 07/06/2015   Aortic atherosclerosis 12/28/2014   Former smoker 12/09/2013   Mixed hyperlipidemia 12/09/2013   Complex partial seizure (HCC) 03/02/2013   Stage 3 severe COPD by GOLD classification (HCC) 12/26/2012   Asthma 12/26/2012   PCP:  Antoniette Vermell CROME, PA-C Pharmacy:   [id number] - [place], [place] - [address]OSS [address] [place] [address] Phone: [phone] Fax: [phone]     Social Drivers of Health (SDOH) Social History: SDOH Screenings   Food Insecurity: No Food Insecurity (03/12/2024)  Housing: Low Risk   (03/12/2024)  Transportation Needs: No Transportation Needs (03/12/2024)  Utilities: Not At Risk (03/12/2024)  Alcohol Screen: Low Risk  (01/03/2024)  Depression (PHQ2-9): Medium Risk (02/25/2024)  Financial Resource Strain: Medium Risk (01/03/2024)  Physical Activity: Inactive (01/03/2024)  Social Connections: Moderately Integrated (03/06/2024)  Recent Concern: Social Connections - Moderately Isolated (01/03/2024)  Stress: No Stress Concern Present (01/03/2024)  Tobacco Use: Medium Risk (03/08/2024)   SDOH Interventions: Food Insecurity Interventions: Intervention Not Indicated, Inpatient TOC Housing Interventions: Intervention Not Indicated, Inpatient TOC Transportation Interventions: Intervention Not Indicated, Inpatient TOC Utilities Interventions: Intervention Not Indicated, Inpatient TOC   Readmission Risk Interventions    03/12/2024    5:23 PM 03/09/2024    1:18 PM  Readmission Risk Prevention Plan  Transportation Screening Complete Complete  PCP or Specialist Appt within 5-7 Days Complete Complete  Home Care Screening Complete Complete  Medication Review (RN CM) Complete Complete

## 2024-03-12 NOTE — Plan of Care (Signed)

## 2024-03-12 NOTE — Progress Notes (Signed)
 Progress Note   Patient: Marissa Willis FMW:[id number] DOB: 05/10/1956 DOA: 03/06/2024     5 DOS: the patient was seen and examined on 03/12/2024   Brief hospital course: 68yo with h/o COPD, complex partial seizure d/o, MCI, preDM, HLD, OSA on CPAP, class 2 obesity, and cirrhosis who presented on 9/19 from her PCP office with AKI and hypercalcemia.  She was treated with IVF and calcitonin with improvement but developed pulmonary edema and so IVF was stopped and she was given Lasix .  Also with GI bleeding and underwent EGD with AVM cauterization.  Assessment and Plan:  AKI In setting hypercalcemia, volume depletion, and ACE + HCTZ Patient presented with a creatinine of 6, BUN 60 Renal US :  No acute findings. Suspected chronic medical renal disease. Treated  with aggressive IV fluids MM: Protein electrophoresis M spike negative.  UPEP pending  Bone survey negative Function continues to very slowly improve, creatinine down to 2.73 Has received Lasix /Albumin  to promote diuresis and help with pulmonary edema Appreciate nephrology assistance   Hypercalcemia Could be related to primary hyperparathyroidism Thyroid  ultrasound showed multinodular thyroid  Parathyroid  scan ordered to assess for parathyroid  adenoma and was negative Received calcitonin with good response, restarted on 9/24 Treated with IV fluids but developed volume overload Received a dose of zoledronic  acid Bone survey negative for lytic  lesions.  Protein electrophoresis: Negative for M Spike Ongoing evaluation with nephrology assistance Referral to outpatient endocrinologist, Dr. Faythe - appointment on October 3 at 2 PM   Acute on chronic blood loss anemia Heme Positive stool GI consulted Underwent endoscopy on 9/21 with erythematous mucosa in the stomach, a single nonbleeding angioectasia in the stomach treated with argon plasma coagulation, multiple recent bleeding angioectasias in the duodenum treated with argon plasma  coagulation Continue IV Protonix  IV BID Transfused 1 unit PRBC on 9/24 for Hgb 7.2 with appropriate increase Appears to be equilibrated and stable at this time   Acute pulmonary edema Developed dyspnea in the setting of IV fluids Fluids stopped  Treated with IV lasix  Also given nebs which have been making her agitated/jittery/have diarrhea Stop nebs and transition back to home prn Albuterol  HFA   Hyperlipidemia Holding statin for now   Hepatic steatosis, cirrhosis Mildly elevated LFTs Cirrhosis by imaging Will trend   Hypertension Holding hydrochlorothiazide  and lisinopril  in the setting of AKI and hypercalcemia Reasonable BP control, 147/52   Hypokalemia/hypophosphatemia Repleted Recheck in AM   Asthma/COPD As above, significant symptoms with nebs so will stop Transition back to Albuterol  HFA Can add back nebs if needed   Alcohol use Monitor on CIWA   Hypokalemia Replete and follow       Consultants: Nephrology GI PT OT TOC team   Procedures: EGD 9/21   Antibiotics: None    30 Day Unplanned Readmission Risk Score    Flowsheet Row ED to Hosp-Admission (Current) from 03/06/2024 in [facility]  30 Day Unplanned Readmission Risk Score (%) 15.46 Filed at 03/12/2024 0400    This score is the patient's risk of an unplanned readmission within 30 days of being discharged (0 -100%). The score is based on dignosis, age, lab data, medications, orders, and past utilization.   Low:  0-14.9   Medium: 15-21.9   High: 22-29.9   Extreme: 30 and above           Subjective: Feeling better today.  Getting up and about with some SOB that improves with rest.   Objective: Vitals:  03/12/24 0529 03/12/24 1230  BP: 129/62 (!) 132/54  Pulse: 78 64  Resp: 19   Temp: 99.1 F (37.3 C) 98.8 F (37.1 C)  SpO2: 94% 98%    Intake/Output Summary (Last 24 hours) at 03/12/2024 1506 Last data filed at 03/12/2024 0654 Gross per 24  hour  Intake --  Output 3075 ml  Net -3075 ml   Filed Weights   03/08/24 0851 03/11/24 0438 03/12/24 0500  Weight: 101.8 kg 97.7 kg 92.5 kg    Exam:  General:  Appears calm and comfortable and is in NAD Eyes:  normal lids, iris ENT:  grossly normal hearing, lips & tongue, mmm Cardiovascular:  RRR. No LE edema.  Respiratory:   CTA bilaterally with no wheezes/rales/rhonchi.  Normal respiratory effort. Abdomen:  soft, NT, ND Skin:  no rash or induration seen on limited exam Musculoskeletal:  grossly normal tone BUE/BLE, good ROM, no bony abnormality Psychiatric:  grossly normal mood and affect, speech fluent and appropriate, AOx3 Neurologic:  CN 2-12 grossly intact, moves all extremities in coordinated fashion  Data Reviewed: I have reviewed the patient's lab results since admission.  Pertinent labs for today include:   K+ 2.9, repleted BUN 25/Creatinine 2.73/GFR 18 Calcium  10.9 Anion gap 17 Phos 2.2 AP 169 AST 47/ALT 35 WBC 11.5 Hgb 9     Family Communication: Husband was present throughout  Mobility: PT/OT Consulted      Code Status: Full Code  Barriers to discharge:  persistent hypercalcemia  Disposition: Status is: Inpatient Remains inpatient appropriate because: ongoing management     Time spent: 50 minutes  Unresulted Labs (From admission, onward)     Start     Ordered   03/12/24 1210  Calcitriol (1,25 di-OH Vit D)  Once,   R        03/12/24 1210   03/12/24 1208  PTH-related peptide  Once,   R        03/12/24 1210   03/12/24 0500  Renal function panel  Daily,   R      03/11/24 1258   03/10/24 1820  Calcium , urine, 24 hour  Once,   R        03/10/24 1820   03/08/24 0741  PTH-related peptide  Once,   R        03/08/24 0740   03/06/24 1743  UPEP/UIFE/Light Chains/TP, 24-Hr Ur  Once,   R        03/06/24 1743   Unscheduled  CBC  Daily,   R      03/12/24 1506             Author: Delon Herald, MD 03/12/2024 3:06 PM  For on call review  [email or web address].

## 2024-03-12 NOTE — Progress Notes (Addendum)
 Patient ID: Marissa Willis, female   DOB: 06-Feb-1956, 68 y.o.   MRN: [id number] [facility] Progress Note   Assessment/ Plan:   1. Acute kidney Injury on chronic kidney disease stage IIIa: Appears to be hemodynamically mediated in the setting of hypercalcemia and volume contraction in the setting of ACE inhibitor/HCTZ.  Nonoliguric with unchanged renal function/creatinine overnight.  Status post albumin /furosemide  yesterday. 2.  Hypercalcemia: Improved with medical management earlier and with minimal response to forced diuresis yesterday.  Parathyroid  nuclear scan negative for adenoma, previous SPEP negative for M spike.  LDH level marginally elevated but imaging done so far without evidence of intra-abdominal or perihilar adenopathy to suggest lymphoma.  I will check PTH related peptide (again looking for paraneoplastic sources of hypercalcemia) as well as a 1-25-hydroxy vitamin D  level looking for granulomatous conditions.  She has associated hypophosphatemia and is ongoing phosphorus replacement. 3.  Hypokalemia: Secondary to IV fluids and forced diuresis.  Will continue to replace via oral route and recheck labs tomorrow morning.  Magnesium  stores adequate. 5.  Acute on chronic blood loss anemia: With Hemoccult positive stool for which she underwent EGD showing a single nonbleeding angioectasia treated with APC and prior treatment of duodenal ectasias with APC as well.  She also has an incidental finding of cirrhosis.  Subjective:   Reports some diarrhea after beginning phosphorus replacement.   Objective:   BP 129/62 (BP Location: Left Arm)   Pulse 78   Temp 99.1 F (37.3 C) (Oral)   Resp 19   Ht 5' 2 (1.575 m)   Wt 92.5 kg   SpO2 94%   BMI 37.31 kg/m   Intake/Output Summary (Last 24 hours) at 03/12/2024 1151 Last data filed at 03/12/2024 0654 Gross per 24 hour  Intake --  Output 3075 ml  Net -3075 ml   Weight change: -5.166 kg  Physical Exam: Gen: Comfortably  resting in bed CVS: Pulse regular rhythm, normal rate, normal S1 and S2. Resp: Clear to auscultation bilaterally, no rales/rhonchi Abd: Soft, obese/protuberant, nontender, bowel sounds normal Ext: No palpable ankle edema  Imaging: NM Parathyroid  W/Spect Result Date: 03/11/2024 CLINICAL DATA:  Hyperparathyroidism. EXAM: NM PARATHYROID  SCINTIGRAPHY AND SPECT IMAGING TECHNIQUE: Following intravenous administration of radiopharmaceutical, early and 2-hour delayed planar images were obtained in the anterior projection. Delayed triplanar SPECT images were also obtained at 2 hours. RADIOPHARMACEUTICALS:  25.3 mCi Tc-5m Sestamibi IV COMPARISON:  None FINDINGS: Anterior planar imaging demonstrates normal washout activity from the thyroid  gland at 2 hour imaging. SPECT imaging of the head neck demonstrates no focal activity within the thyroid  bed above background mild thyroid  gland activity. IMPRESSION: No evidence of parathyroid  adenoma on planar or SPECT imaging. Electronically Signed   By: Jackquline Boxer M.D.   On: 03/11/2024 17:37   US  THYROID  Result Date: 03/11/2024 CLINICAL DATA:  Other.  2665 Hypercalcemia 2665 EXAM: THYROID  ULTRASOUND TECHNIQUE: Ultrasound examination of the thyroid  gland and adjacent soft tissues was performed. COMPARISON:  CT chest 08/19/2021. Carotid ultrasound duplex, 03/05/2013. FINDINGS: Parenchymal Echotexture: Mildly heterogenous Isthmus: 0.5 cm Right lobe: 3.8 x 1.8 x 2.1 cm Left lobe: 3.6 x 2.1 x 2.0 cm _________________________________________________________ Estimated total number of nodules >/= 1 cm: 2 Number of spongiform nodules >/=  2 cm not described below (TR1): 0 Number of mixed cystic and solid nodules >/= 1.5 cm not described below (TR2): 0 _________________________________________________________ Nodule # 2: Location: LEFT; Mid Maximum size: 1.0 cm; Other 2 dimensions: 1.0 x 0 8 cm Composition: solid/almost completely solid (  2) Echogenicity: isoechoic (1) Shape:  taller-than-wide (3) Margins: ill-defined (0) Echogenic foci: none (0) ACR TI-RADS total points: 6. ACR TI-RADS risk category: TR4 (4-6 points). ACR TI-RADS recommendations: *Given size (>/= 1 - 1.4 cm) and appearance, a follow-up ultrasound in 1 year should be considered based on TI-RADS criteria. _________________________________________________________ Nodule # 4: Location: LEFT; Inferior Maximum size: 1.4 cm; Other 2 dimensions: 0.8 x 0.8 cm Composition: solid/almost completely solid (2) Echogenicity: cannot determine (1) Shape: not taller-than-wide (0) Margins: ill-defined (0) Echogenic foci: macrocalcifications (1) ACR TI-RADS total points: 4. ACR TI-RADS risk category: TR4 (4-6 points). ACR TI-RADS recommendations: *Given size (>/= 1 - 1.4 cm) and appearance, a follow-up ultrasound in 1 year should be considered based on TI-RADS criteria. _________________________________________________________ Additional sub-1 cm solid and cystic nodules scattered within the gland do not meet threshold for follow-up nor biopsy per current criteria. No cervical adenopathy or abnormal fluid collection within the imaged neck. This examination was not protocoled for, nor were any parathyroid  glands seen or evaluated. IMPRESSION: 1. Nonenlarged, mildly heterogeneous and multinodular thyroid  gland. 2. 1.0 cm mid and 1.4 cm LEFT inferior TR-4 thyroid  nodules. A follow-up ultrasound in 1 year should be considered based on TI-RADS criteria. 3. This examination was not protocoled for, nor were any parathyroid  glands seen or evaluated. The above is in keeping with the ACR TI-RADS recommendations - J Am Coll Radiol 2017;14:587-595. Electronically Signed   By: Thom Hall M.D.   On: 03/11/2024 07:08    Labs: BMET Recent Labs  Lab 03/06/24 1106 03/06/24 2046 03/07/24 0554 03/08/24 0636 03/09/24 0530 03/10/24 0544 03/10/24 1524 03/11/24 0545 03/12/24 0539  NA 137   < > 144 144 145 143 142 143 143  K 3.5   < > 3.2* 3.5  3.0* 2.9* 3.4* 3.1* 2.9*  CL 96*   < > 108 110 112* 108 107 108 106  CO2 26   < > 24 22 20* 21* 22 22 21*  GLUCOSE 92   < > 86 99 92 91 93 89 91  BUN 58*   < > 59* 43* 31* 26* 26* 25* 25*  CREATININE 6.00*   < > 5.01* 4.07* 3.15* 2.87* 2.86* 2.74* 2.73*  CALCIUM  >15.0*   < > 14.2* 13.1* 11.3* 10.7* 11.0* 11.0* 10.9*  PHOS 3.4  --   --  2.7 2.2* 2.1*  --  1.4* 2.2*   < > = values in this interval not displayed.   CBC Recent Labs  Lab 03/06/24 1106 03/07/24 0554 03/09/24 1450 03/09/24 1925 03/10/24 0544 03/11/24 0545 03/12/24 0538  WBC 10.4   < > 9.0  --  9.0 8.4 11.5*  NEUTROABS 6.0  --   --   --   --   --  7.7  HGB 10.0*   < > 7.5* 7.5* 7.2* 8.5* 9.0*  HCT 30.5*   < > 23.4* 23.0* 22.2* 26.5* 26.9*  MCV 98.7   < > 101.7*  --  100.9* 98.1 96.8  PLT 276   < > 207  --  215 245 312   < > = values in this interval not displayed.    Medications:     Chlorhexidine  Gluconate Cloth  6 each Topical Daily   dicyclomine   10 mg Oral TID AC & HS   feeding supplement  237 mL Oral BID BM   fluticasone   1 spray Each Nare Daily   folic acid   1 mg Oral Daily   Influenza vac split trivalent  PF  0.5 mL Intramuscular Tomorrow-1000   multivitamin with minerals  1 tablet Oral Daily   pantoprazole   40 mg Oral BID   phosphorus  500 mg Oral Daily   potassium chloride   40 mEq Oral BID   thiamine   100 mg Oral Daily   Or   thiamine   100 mg Intravenous Daily    Gordy Blanch, MD 03/12/2024, 11:51 AM

## 2024-03-12 NOTE — Plan of Care (Signed)
  Problem: Education: Goal: Knowledge of General Education information will improve Description: Including pain rating scale, medication(s)/side effects and non-pharmacologic comfort measures Outcome: Progressing   Problem: Health Behavior/Discharge Planning: Goal: Ability to manage health-related needs will improve Outcome: Progressing   Problem: Clinical Measurements: Goal: Ability to maintain clinical measurements within normal limits will improve Outcome: Progressing Goal: Will remain free from infection Outcome: Progressing Goal: Cardiovascular complication will be avoided Outcome: Progressing   Problem: Activity: Goal: Risk for activity intolerance will decrease Outcome: Progressing   Problem: Coping: Goal: Level of anxiety will decrease Outcome: Progressing   Problem: Elimination: Goal: Will not experience complications related to urinary retention Outcome: Progressing   Problem: Pain Managment: Goal: General experience of comfort will improve and/or be controlled Outcome: Progressing   Problem: Skin Integrity: Goal: Risk for impaired skin integrity will decrease Outcome: Progressing

## 2024-03-13 DIAGNOSIS — N179 Acute kidney failure, unspecified: Secondary | ICD-10-CM | POA: Diagnosis not present

## 2024-03-13 LAB — CBC
HCT: 28.9 % — ABNORMAL LOW (ref 36.0–46.0)
Hemoglobin: 9.3 g/dL — ABNORMAL LOW (ref 12.0–15.0)
MCH: 32.2 pg (ref 26.0–34.0)
MCHC: 32.2 g/dL (ref 30.0–36.0)
MCV: 100 fL (ref 80.0–100.0)
Platelets: 370 K/uL (ref 150–400)
RBC: 2.89 MIL/uL — ABNORMAL LOW (ref 3.87–5.11)
RDW: 14.2 % (ref 11.5–15.5)
WBC: 13.1 K/uL — ABNORMAL HIGH (ref 4.0–10.5)
nRBC: 0 % (ref 0.0–0.2)

## 2024-03-13 LAB — RENAL FUNCTION PANEL
Albumin: 4 g/dL (ref 3.5–5.0)
Anion gap: 14 (ref 5–15)
BUN: 25 mg/dL — ABNORMAL HIGH (ref 8–23)
CO2: 20 mmol/L — ABNORMAL LOW (ref 22–32)
Calcium: 10.5 mg/dL — ABNORMAL HIGH (ref 8.9–10.3)
Chloride: 111 mmol/L (ref 98–111)
Creatinine, Ser: 2.48 mg/dL — ABNORMAL HIGH (ref 0.44–1.00)
GFR, Estimated: 20 mL/min — ABNORMAL LOW (ref >60)
Glucose, Bld: 90 mg/dL (ref 70–99)
Phosphorus: 2.4 mg/dL — ABNORMAL LOW (ref 2.5–4.6)
Potassium: 3.9 mmol/L (ref 3.5–5.1)
Sodium: 144 mmol/L (ref 135–145)

## 2024-03-13 LAB — HEPATIC FUNCTION PANEL
ALT: 42 U/L (ref 0–44)
AST: 49 U/L — ABNORMAL HIGH (ref 15–41)
Albumin: 3.7 g/dL (ref 3.5–5.0)
Alkaline Phosphatase: 184 U/L — ABNORMAL HIGH (ref 38–126)
Bilirubin, Direct: 0.1 mg/dL (ref 0.0–0.2)
Indirect Bilirubin: 0.2 mg/dL — ABNORMAL LOW (ref 0.3–0.9)
Total Bilirubin: 0.4 mg/dL (ref 0.0–1.2)
Total Protein: 6.7 g/dL (ref 6.5–8.1)

## 2024-03-13 MED ORDER — POTASSIUM CHLORIDE CRYS ER 20 MEQ PO TBCR
40.0000 meq | EXTENDED_RELEASE_TABLET | Freq: Once | ORAL | Status: AC
Start: 2024-03-13 — End: 2024-03-13
  Administered 2024-03-13: 40 meq via ORAL
  Filled 2024-03-13: qty 2

## 2024-03-13 NOTE — Evaluation (Signed)
 Physical Therapy Evaluation Patient Details Name: Marissa Willis MRN: [id number] DOB: [birth year] Today's Date: 03/13/2024  History of Present Illness  68yo female  who presented on 9/19 from her PCP office with AKI and hypercalcemia,  developed pulmonary edema, GI bleeding and underwent EGD with AVM cauterization. PMH: COPD, complex partial seizure d/o, MCI, preDM, HLD, OSA on CPAP, class 2 obesity, and cirrhosis  Clinical Impression  Pt admitted with above diagnosis.  Pt currently with functional limitations due to the deficits listed below (see PT Problem List). Pt will benefit from acute skilled PT to increase their independence and safety with mobility to allow discharge.     The patient reports feeling stronger. Patient ambulated x 120' using RW. Patient is noted with DOE, SPO2 96% post  walk.  Patient is noted with tremors.  Patient declines HHPT, Has DME needs met.   Encouraged shorter more frequent walks to build stamina.endurance.      If plan is discharge home, recommend the following: A little help with walking and/or transfers;A little help with bathing/dressing/bathroom;Assistance with cooking/housework;Assist for transportation;Help with stairs or ramp for entrance   Can travel by private vehicle        Equipment Recommendations None recommended by PT  Recommendations for Other Services       Functional Status Assessment Patient has had a recent decline in their functional status and demonstrates the ability to make significant improvements in function in a reasonable and predictable amount of time.     Precautions / Restrictions Precautions Precautions: Fall Precaution/Restrictions Comments: seizure Restrictions Weight Bearing Restrictions Per Provider Order: No      Mobility  Bed Mobility Overal bed mobility: Needs Assistance Bed Mobility: Supine to Sit, Sit to Supine     Supine to sit: Supervision, Used rails Sit to supine: Supervision         Transfers Overall transfer level: Needs assistance Equipment used: Rolling walker (2 wheels) Transfers: Sit to/from Stand Sit to Stand: Supervision                Ambulation/Gait Ambulation/Gait assistance: Supervision Gait Distance (Feet): 120 Feet Assistive device: Rolling walker (2 wheels) Gait Pattern/deviations: Step-through pattern Gait velocity: decr     General Gait Details: noted tremulous UE's. dyspne 2-3/4  Stairs            Wheelchair Mobility     Tilt Bed    Modified Rankin (Stroke Patients Only)       Balance Overall balance assessment: Needs assistance Sitting-balance support: No upper extremity supported, Feet supported Sitting balance-Leahy Scale: Good     Standing balance support: No upper extremity supported, During functional activity Standing balance-Leahy Scale: Fair                               Pertinent Vitals/Pain Pain Assessment Pain Assessment: No/denies pain    Home Living Family/patient expects to be discharged to:: Private residence Living Arrangements: Spouse/significant other Available Help at Discharge: Family Type of Home: House Home Access: Ramped entrance       Home Layout: One level Home Equipment: BSC/3in1;Rollator (4 wheels);Grab bars - tub/shower      Prior Function Prior Level of Function : Independent/Modified Independent             Mobility Comments: did not use AD       Extremity/Trunk Assessment        Lower Extremity Assessment Lower Extremity Assessment: Overall WFL for  tasks assessed    Cervical / Trunk Assessment Cervical / Trunk Assessment: Normal  Communication   Communication Communication: No apparent difficulties    Cognition Arousal: Alert Behavior During Therapy: WFL for tasks assessed/performed   PT - Cognitive impairments: No apparent impairments                         Following commands: Intact       Cueing       General  Comments      Exercises     Assessment/Plan    PT Assessment Patient needs continued PT services  PT Problem List Decreased strength;Decreased activity tolerance;Decreased mobility;Cardiopulmonary status limiting activity       PT Treatment Interventions DME instruction;Gait training;Functional mobility training;Therapeutic activities    PT Goals (Current goals can be found in the Care Plan section)  Acute Rehab PT Goals Patient Stated Goal: go home PT Goal Formulation: With patient/family Time For Goal Achievement: 03/27/24 Potential to Achieve Goals: Good    Frequency Min 2X/week     Co-evaluation               AM-PAC PT 6 Clicks Mobility  Outcome Measure Help needed turning from your back to your side while in a flat bed without using bedrails?: None Help needed moving from lying on your back to sitting on the side of a flat bed without using bedrails?: None Help needed moving to and from a bed to a chair (including a wheelchair)?: A Little Help needed standing up from a chair using your arms (e.g., wheelchair or bedside chair)?: A Little Help needed to walk in hospital room?: A Little Help needed climbing 3-5 steps with a railing? : A Little 6 Click Score: 20    End of Session Equipment Utilized During Treatment: Gait belt Activity Tolerance: Patient tolerated treatment well Patient left: in bed;with call bell/phone within reach;with family/visitor present Nurse Communication: Mobility status PT Visit Diagnosis: Unsteadiness on feet (R26.81);Difficulty in walking, not elsewhere classified (R26.2)    Time: 1018-1030 PT Time Calculation (min) (ACUTE ONLY): 12 min   Charges:   PT Evaluation $PT Eval Low Complexity: 1 Low   PT General Charges $$ ACUTE PT VISIT: 1 Visit         Darice Potters PT Acute Rehabilitation Services Office [phone]   Potters Darice Norris 03/13/2024, 10:43 AM

## 2024-03-13 NOTE — Progress Notes (Signed)
 Patient ID: Marissa Willis, female   DOB: Jun 26, 1955, 68 y.o.   MRN: [id number] [facility] Progress Note   Assessment/ Plan:   1. Acute kidney Injury on chronic kidney disease stage IIIa: Appears to be hemodynamically mediated in the setting of hypercalcemia and volume contraction in the setting of ACE inhibitor/HCTZ.  Nonoliguric with improvement of creatinine seen on labs this morning.  Plan to discharge her home tomorrow if renal function continues to improve and calcium  levels are still trending down with outpatient renal follow-up in 2 weeks. 2.  Hypercalcemia: Improved with medical management earlier and with minimal response to forced diuresis yesterday.  Parathyroid  nuclear scan negative for adenoma, previous SPEP negative for M spike.  LDH level marginally elevated but imaging done so far without evidence of intra-abdominal or perihilar adenopathy to suggest lymphoma.  PTH related peptide and 1-25-hydroxy vitamin D  level pending. 3.  Hypokalemia: Secondary to IV fluids and forced diuresis.  Replaced via oral routes and confirmed that magnesium  stores are adequate. 5.  Acute on chronic blood loss anemia: With Hemoccult positive stool for which she underwent EGD showing a single nonbleeding angioectasia treated with APC and prior treatment of duodenal ectasias with APC as well.  She also has an incidental finding of cirrhosis.  Subjective:   No acute events overnight, disappointed that she cannot go home today.   Objective:   BP (!) 147/51 (BP Location: Right Arm)   Pulse 71   Temp 98.5 F (36.9 C) (Oral)   Resp 19   Ht 5' 2 (1.575 m)   Wt 94.8 kg   SpO2 96%   BMI 38.23 kg/m   Intake/Output Summary (Last 24 hours) at 03/13/2024 1231 Last data filed at 03/13/2024 0946 Gross per 24 hour  Intake 220 ml  Output 1175 ml  Net -955 ml   Weight change: 2.268 kg  Physical Exam: Gen: Comfortably ambulating around room with rolling walker CVS: Pulse regular rhythm,  normal rate, normal S1 and S2. Resp: Clear to auscultation bilaterally, no rales/rhonchi Abd: Soft, obese/protuberant, nontender, bowel sounds normal Ext: No palpable ankle edema  Imaging: NM Parathyroid  W/Spect Result Date: 03/11/2024 CLINICAL DATA:  Hyperparathyroidism. EXAM: NM PARATHYROID  SCINTIGRAPHY AND SPECT IMAGING TECHNIQUE: Following intravenous administration of radiopharmaceutical, early and 2-hour delayed planar images were obtained in the anterior projection. Delayed triplanar SPECT images were also obtained at 2 hours. RADIOPHARMACEUTICALS:  25.3 mCi Tc-61m Sestamibi IV COMPARISON:  None FINDINGS: Anterior planar imaging demonstrates normal washout activity from the thyroid  gland at 2 hour imaging. SPECT imaging of the head neck demonstrates no focal activity within the thyroid  bed above background mild thyroid  gland activity. IMPRESSION: No evidence of parathyroid  adenoma on planar or SPECT imaging. Electronically Signed   By: Jackquline Boxer M.D.   On: 03/11/2024 17:37    Labs: BMET Recent Labs  Lab 03/08/24 0636 03/09/24 0530 03/10/24 0544 03/10/24 1524 03/11/24 0545 03/12/24 0539 03/13/24 0508  NA 144 145 143 142 143 143 144  K 3.5 3.0* 2.9* 3.4* 3.1* 2.9* 3.9  CL 110 112* 108 107 108 106 111  CO2 22 20* 21* 22 22 21* 20*  GLUCOSE 99 92 91 93 89 91 90  BUN 43* 31* 26* 26* 25* 25* 25*  CREATININE 4.07* 3.15* 2.87* 2.86* 2.74* 2.73* 2.48*  CALCIUM  13.1* 11.3* 10.7* 11.0* 11.0* 10.9* 10.5*  PHOS 2.7 2.2* 2.1*  --  1.4* 2.2* 2.4*   CBC Recent Labs  Lab 03/10/24 0544 03/11/24 0545 03/12/24 0538 03/13/24 9491  WBC 9.0 8.4 11.5* 13.1*  NEUTROABS  --   --  7.7  --   HGB 7.2* 8.5* 9.0* 9.3*  HCT 22.2* 26.5* 26.9* 28.9*  MCV 100.9* 98.1 96.8 100.0  PLT 215 245 312 370    Medications:     Chlorhexidine  Gluconate Cloth  6 each Topical Daily   dicyclomine   10 mg Oral TID AC & HS   feeding supplement  237 mL Oral BID BM   fluticasone   1 spray Each Nare Daily    folic acid   1 mg Oral Daily   Influenza vac split trivalent PF  0.5 mL Intramuscular Tomorrow-1000   multivitamin with minerals  1 tablet Oral Daily   pantoprazole   40 mg Oral BID   phosphorus  500 mg Oral Daily   thiamine   100 mg Oral Daily   Or   thiamine   100 mg Intravenous Daily    Gordy Blanch, MD 03/13/2024, 12:31 PM

## 2024-03-13 NOTE — Plan of Care (Signed)
   Problem: Education: Goal: Knowledge of General Education information will improve Description Including pain rating scale, medication(s)/side effects and non-pharmacologic comfort measures Outcome: Progressing   Problem: Health Behavior/Discharge Planning: Goal: Ability to manage health-related needs will improve Outcome: Progressing

## 2024-03-13 NOTE — Plan of Care (Signed)
  Problem: Health Behavior/Discharge Planning: Goal: Ability to manage health-related needs will improve Outcome: Progressing   Problem: Clinical Measurements: Goal: Diagnostic test results will improve Outcome: Progressing   Problem: Pain Managment: Goal: General experience of comfort will improve and/or be controlled Outcome: Progressing   Problem: Safety: Goal: Ability to remain free from injury will improve Outcome: Progressing

## 2024-03-13 NOTE — Progress Notes (Signed)
   03/13/24 2129  BiPAP/CPAP/SIPAP  BiPAP/CPAP/SIPAP Pt Type Adult  BiPAP/CPAP/SIPAP Resmed  Mask Type Nasal mask  Dentures removed? Not applicable  FiO2 (%) 21 %  Patient Home Machine Yes  Safety Check Completed by RT for Home Unit Yes, no issues noted  Patient Home Mask Yes  Patient Home Tubing Yes  Device Plugged into RED Power Outlet Yes

## 2024-03-13 NOTE — Progress Notes (Signed)
   03/12/24 2300  BiPAP/CPAP/SIPAP  BiPAP/CPAP/SIPAP Pt Type Adult  BiPAP/CPAP/SIPAP Resmed  Mask Type Nasal mask  Dentures removed? Not applicable  FiO2 (%) 21 %  Patient Home Machine Yes  Safety Check Completed by RT for Home Unit Yes, no issues noted  Patient Home Mask Yes  Patient Home Tubing Yes  Auto Titrate No  Device Plugged into RED Power Outlet Yes

## 2024-03-13 NOTE — TOC Progression Note (Signed)
 Transition of Care [facility]) - Progression Note    Patient Details  Name: Coretta Leisey MRN: [id number] Date of Birth: 1955/08/24  [facility] of [facility]) CM/SW Contact  Sonda Manuella Quill, RN Phone Number: 03/13/2024, 5:55 PM  Clinical Narrative:    Pt declined PT services.   Expected Discharge Plan: Home/Self Care Barriers to Discharge: Continued Medical Work up               Expected Discharge Plan and Services   Discharge Planning Services: CM Consult   Living arrangements for the past 2 months: Single Family Home                 DME Arranged: N/A DME Agency: NA       HH Arranged: NA HH Agency: NA         Social Drivers of Health (SDOH) Interventions SDOH Screenings   Food Insecurity: No Food Insecurity (03/12/2024)  Housing: Low Risk  (03/12/2024)  Transportation Needs: No Transportation Needs (03/12/2024)  Utilities: Not At Risk (03/12/2024)  Alcohol Screen: Low Risk  (01/03/2024)  Depression (PHQ2-9): Medium Risk (02/25/2024)  Financial Resource Strain: Medium Risk (01/03/2024)  Physical Activity: Inactive (01/03/2024)  Social Connections: Moderately Integrated (03/06/2024)  Recent Concern: Social Connections - Moderately Isolated (01/03/2024)  Stress: No Stress Concern Present (01/03/2024)  Tobacco Use: Medium Risk (03/08/2024)    Readmission Risk Interventions    03/12/2024    5:23 PM 03/09/2024    1:18 PM  Readmission Risk Prevention Plan  Transportation Screening Complete Complete  PCP or Specialist Appt within 5-7 Days Complete Complete  Home Care Screening Complete Complete  Medication Review (RN CM) Complete Complete

## 2024-03-13 NOTE — Progress Notes (Addendum)
 Progress Note   Patient: Marissa Willis FMW:[id number] DOB: 1956-01-16 DOA: 03/06/2024     6 DOS: the patient was seen and examined on 03/13/2024   Brief hospital course: 68yo with h/o COPD, complex partial seizure d/o, MCI, preDM, HLD, OSA on CPAP, class 2 obesity, and cirrhosis who presented on 9/19 from her PCP office with AKI and hypercalcemia.  She was treated with IVF and calcitonin with improvement but developed pulmonary edema and so IVF was stopped and she was given Lasix .  Also with GI bleeding and underwent EGD with AVM cauterization.  Assessment and Plan:  AKI In setting hypercalcemia, volume depletion, and ACE + HCTZ Patient presented with a creatinine of 6, BUN 60 Renal US :  No acute findings. Suspected chronic medical renal disease. Treated  with aggressive IV fluids MM: Protein electrophoresis M spike negative.  UPEP pending  Bone survey negative Function continues to very slowly improve, creatinine down to 2.48 Has received Lasix /Albumin  to promote diuresis and help with pulmonary edema Appreciate nephrology assistance Will dc on 9/27 if she continues to improve Will need outpatient nephrology f/u in 2 weeks   Hypercalcemia Could be related to primary hyperparathyroidism, awaiting further testing Thyroid  ultrasound showed multinodular thyroid  Parathyroid  scan ordered to assess for parathyroid  adenoma and was negative LDH mildly elevated without other evidence for lymphoma Received calcitonin with good response, restarted on 9/24 with improvement again Treated with IV fluids but developed volume overload Received a dose of zoledronic  acid Bone survey negative for lytic  lesions.  Protein electrophoresis: Negative for M Spike Ongoing evaluation with nephrology assistance Referral to outpatient endocrinologist, Dr. Faythe - appointment on October 3 at 2 PM   Acute on chronic blood loss anemia Heme Positive stool GI consulted Underwent endoscopy on 9/21 with  erythematous mucosa in the stomach, a single nonbleeding angioectasia in the stomach treated with argon plasma coagulation, multiple recent bleeding angioectasias in the duodenum treated with argon plasma coagulation Continue IV Protonix  IV BID Transfused 1 unit PRBC on 9/24 for Hgb 7.2 with appropriate increase Appears to be equilibrated and stable at this time   Acute pulmonary edema Developed dyspnea in the setting of IV fluids Fluids stopped  Treated with IV lasix  Also given nebs which have been making her agitated/jittery/have diarrhea Stopped nebs and transitioned back to home prn Albuterol  HFA Foley was placed, will remove   Hyperlipidemia Holding statin for now   Hepatic steatosis, cirrhosis Mildly elevated LFTs Cirrhosis by imaging Will trend   Hypertension Holding hydrochlorothiazide  and lisinopril  in the setting of AKI and hypercalcemia Reasonable BP control, 147/52   Hypokalemia/hypophosphatemia Repleted Recheck in AM   Asthma/COPD As above, significant symptoms with nebs so will stop Transition back to Albuterol  HFA Can add back nebs if needed   Alcohol use Monitor on CIWA   Hypokalemia Replete and follow       Consultants: Nephrology GI PT OT TOC team   Procedures: EGD 9/21   Antibiotics: None    30 Day Unplanned Readmission Risk Score    Flowsheet Row ED to Hosp-Admission (Current) from 03/06/2024 in [facility]  30 Day Unplanned Readmission Risk Score (%) 15.64 Filed at 03/13/2024 1600    This score is the patient's risk of an unplanned readmission within 30 days of being discharged (0 -100%). The score is based on dignosis, age, lab data, medications, orders, and past utilization.   Low:  0-14.9   Medium: 15-21.9   High: 22-29.9  Extreme: 30 and above           Subjective: Feels great and eager to go home!   Objective: Vitals:   03/13/24 0522 03/13/24 1711  BP:  (!) 134/54  Pulse:  62   Resp:  17  Temp:  98.6 F (37 C)  SpO2: 96% 98%    Intake/Output Summary (Last 24 hours) at 03/13/2024 1715 Last data filed at 03/13/2024 0946 Gross per 24 hour  Intake 220 ml  Output 725 ml  Net -505 ml   Filed Weights   03/11/24 0438 03/12/24 0500 03/13/24 0500  Weight: 97.7 kg 92.5 kg 94.8 kg    Exam:  General:  Appears calm and comfortable and is in NAD Eyes:  normal lids, iris ENT:  grossly normal hearing, lips & tongue, mmm Cardiovascular:  RRR. No LE edema.  Respiratory:   CTA bilaterally with no wheezes/rales/rhonchi.  Normal respiratory effort. Abdomen:  soft, NT, ND Skin:  no rash or induration seen on limited exam Musculoskeletal:  grossly normal tone BUE/BLE, good ROM, no bony abnormality Psychiatric:  grossly normal mood and affect, speech fluent and appropriate, AOx3 Neurologic:  CN 2-12 grossly intact, moves all extremities in coordinated fashion  Data Reviewed: I have reviewed the patient's lab results since admission.  Pertinent labs for today include:   CO2 20 BUN 25/Creatinine 2.48/GFR 20, improving Calcium  10.5, improving WBC 13.1 Hgb 9.3     Family Communication: Husband was present  Mobility: PT/OT Consulted and are recommending - No Pt Follow Up (Pt. Declines)03/13/2024 1041    Code Status: Full Code   Disposition: Status is: Inpatient Remains inpatient appropriate because: ongoing monitoring, hopeful for dc 9/27     Time spent: 50 minutes  Unresulted Labs (From admission, onward)     Start     Ordered   03/13/24 1716  Hepatic function panel  Once,   R        03/13/24 1715   03/13/24 1300  UPEP/UIFE/Light Chains/TP, 24-Hr Ur  Once,   R        03/13/24 1300   03/13/24 0500  CBC  Daily,   R      03/12/24 1506   03/12/24 1210  Calcitriol (1,25 di-OH Vit D)  Once,   R        03/12/24 1210   03/12/24 1208  PTH-related peptide  Once,   R        03/12/24 1210   03/12/24 0500  Renal function panel  Daily,   R      03/11/24 1258    03/10/24 1820  Calcium , urine, 24 hour  Once,   R        03/10/24 1820   03/08/24 0741  PTH-related peptide  Once,   R        03/08/24 0740             Author: Delon Herald, MD 03/13/2024 5:15 PM  For on call review [email or web address].

## 2024-03-14 ENCOUNTER — Other Ambulatory Visit (HOSPITAL_COMMUNITY): Payer: Self-pay

## 2024-03-14 DIAGNOSIS — N179 Acute kidney failure, unspecified: Secondary | ICD-10-CM | POA: Diagnosis not present

## 2024-03-14 LAB — RENAL FUNCTION PANEL
Albumin: 3.9 g/dL (ref 3.5–5.0)
Anion gap: 13 (ref 5–15)
BUN: 26 mg/dL — ABNORMAL HIGH (ref 8–23)
CO2: 19 mmol/L — ABNORMAL LOW (ref 22–32)
Calcium: 10.3 mg/dL (ref 8.9–10.3)
Chloride: 110 mmol/L (ref 98–111)
Creatinine, Ser: 2.12 mg/dL — ABNORMAL HIGH (ref 0.44–1.00)
GFR, Estimated: 24 mL/min — ABNORMAL LOW (ref >60)
Glucose, Bld: 84 mg/dL (ref 70–99)
Phosphorus: 2.2 mg/dL — ABNORMAL LOW (ref 2.5–4.6)
Potassium: 4.4 mmol/L (ref 3.5–5.1)
Sodium: 142 mmol/L (ref 135–145)

## 2024-03-14 LAB — CBC
HCT: 28.2 % — ABNORMAL LOW (ref 36.0–46.0)
Hemoglobin: 8.7 g/dL — ABNORMAL LOW (ref 12.0–15.0)
MCH: 31.6 pg (ref 26.0–34.0)
MCHC: 30.9 g/dL (ref 30.0–36.0)
MCV: 102.5 fL — ABNORMAL HIGH (ref 80.0–100.0)
Platelets: 342 K/uL (ref 150–400)
RBC: 2.75 MIL/uL — ABNORMAL LOW (ref 3.87–5.11)
RDW: 14.2 % (ref 11.5–15.5)
WBC: 9.7 K/uL (ref 4.0–10.5)
nRBC: 0 % (ref 0.0–0.2)

## 2024-03-14 MED ORDER — K PHOS MONO-SOD PHOS DI & MONO 155-852-130 MG PO TABS
500.0000 mg | ORAL_TABLET | Freq: Every day | ORAL | 0 refills | Status: DC
  Filled 2024-03-14: qty 60, 30d supply, fill #0

## 2024-03-14 MED ORDER — ENSURE PLUS HIGH PROTEIN PO LIQD
237.0000 mL | Freq: Two times a day (BID) | ORAL | 0 refills | Status: DC
  Filled 2024-03-14: qty 14220, 30d supply, fill #0

## 2024-03-14 MED ORDER — DICYCLOMINE HCL 10 MG PO CAPS
10.0000 mg | ORAL_CAPSULE | Freq: Three times a day (TID) | ORAL | 0 refills | Status: DC
  Filled 2024-03-14: qty 120, 30d supply, fill #0

## 2024-03-14 MED ORDER — INFLUENZA VAC SPLIT HIGH-DOSE 0.5 ML IM SUSY
0.5000 mL | PREFILLED_SYRINGE | INTRAMUSCULAR | Status: AC
  Administered 2024-03-14: 0.5 mL via INTRAMUSCULAR
  Filled 2024-03-14: qty 0.5

## 2024-03-14 NOTE — Discharge Summary (Addendum)
 Physician Discharge Summary   Patient: Marissa Willis MRN: [id number] DOB: 1955-07-27  Admit date:     03/06/2024  Discharge date: 03/14/24  Discharge Physician: Delon Herald   PCP: Antoniette Vermell CROME, PA-C   Recommendations at discharge:   Follow up with Dr. Tobie (nephrology) on 10/9; go for labs 1 week prior to appointment Follow up with Dr. Faythe (endocrinology) on 10/3 at 2pm Continue to hold lisinopril /hydrochlorothiazide  OK to resume Zypitamag  (statin) Ensure Plus High Protein provided for nutrition Continue to take KPhos (potassium/phosphorus) daily for now Use calcitonin nasal spray once daily (alternate nostrils) if needed - hold rx for now Follow up with PA Breeback in 1-2 weeks You are being referred for lung cancer screening and should be contacted to schedule an appointment   Discharge Diagnoses: Principal Problem:   AKI (acute kidney injury) Active Problems:   Mixed hyperlipidemia   Aortic atherosclerosis   Hepatic steatosis   Benign essential hypertension   Hypercalcemia   Hypermagnesemia   Heme positive stool   Transaminitis   Chronic blood loss anemia   Hospital Course: 68yo with h/o COPD, complex partial seizure d/o, MCI, preDM, HLD, OSA on CPAP, class 2 obesity, and cirrhosis who presented on 9/19 from her PCP office with AKI and hypercalcemia.  She was treated with IVF and calcitonin with improvement but developed pulmonary edema and so IVF was stopped and she was given Lasix .  Also with GI bleeding and underwent EGD with AVM cauterization.  Assessment and Plan:  AKI In setting hypercalcemia, volume depletion, and ACE + HCTZ Patient presented with a creatinine of 6, BUN 60 Renal US :  No acute findings. Suspected chronic medical renal disease. Treated  with aggressive IV fluids MM: Protein electrophoresis M spike negative.  UPEP pending  Bone survey negative Function continues to very slowly improve, creatinine down to 2.12 Has received  Lasix /Albumin  to promote diuresis and help with pulmonary edema, but none in several days Appreciate nephrology assistance Will dc on 9/27, as she is continuing to improve Will need outpatient nephrology f/u in 2 weeks   Hypercalcemia Could be related to primary hyperparathyroidism, awaiting further testing Thyroid  ultrasound showed multinodular thyroid  Parathyroid  scan ordered to assess for parathyroid  adenoma and was negative LDH mildly elevated without other evidence for lymphoma Received calcitonin with good response, restarted on 9/24 with improvement again Treated with IV fluids but developed volume overload Received a dose of zoledronic  acid Bone survey negative for lytic  lesions.  Protein electrophoresis: Negative for M Spike Ongoing evaluation with nephrology assistance Referral to outpatient endocrinologist, Dr. Faythe - appointment on October 3 at 2 PM   Acute on chronic blood loss anemia Heme Positive stool GI consulted Underwent endoscopy on 9/21 with erythematous mucosa in the stomach, a single nonbleeding angioectasia in the stomach treated with argon plasma coagulation, multiple recent bleeding angioectasias in the duodenum treated with argon plasma coagulation Continue IV Protonix  IV BID Transfused 1 unit PRBC on 9/24 for Hgb 7.2 with appropriate increase Appears to be equilibrated and stable at this time   Acute pulmonary edema Developed dyspnea in the setting of IV fluids Fluids stopped  Treated with IV lasix  Also given nebs which have been making her agitated/jittery/have diarrhea Stopped nebs and transitioned back to home prn Albuterol  HFA Foley was removed on 9/26   Hyperlipidemia Holding statin for now Stable LFTs, ok to resume   Hepatic steatosis, cirrhosis Mildly elevated LFTs Cirrhosis by imaging Outpatient f/u is recommended   Hypertension Holding hydrochlorothiazide  and  lisinopril  in the setting of AKI and hypercalcemia Reasonable BP control,  147/52 Continue to hold medications   Hypokalemia/hypophosphatemia Repleted, normalized   Asthma/COPD As above, significant symptoms with nebs so will stop Transitioned back to Albuterol  HFA   Alcohol use Monitor on CIWA        Consultants: Nephrology GI PT OT TOC team   Procedures: EGD 9/21   Antibiotics: None     Pain control - [place]  Controlled Substance Reporting System database was reviewed. and patient was instructed, not to drive, operate heavy machinery, perform activities at heights, swimming or participation in water activities or provide baby-sitting services while on Pain, Sleep and Anxiety Medications; until their outpatient Physician has advised to do so again. Also recommended to not to take more than prescribed Pain, Sleep and Anxiety Medications.   Disposition: Home Diet recommendation:  Regular diet DISCHARGE MEDICATION: Allergies as of 03/14/2024       Reactions   Atorvastatin     Muscle aches   Crestor  [rosuvastatin ]    Muscle aches        Medication List     PAUSE taking these medications    lisinopril -hydrochlorothiazide  20-25 MG tablet Wait to take this until your doctor or other care provider tells you to start again. Commonly known as: Zestoretic  Take 1 tablet by mouth daily.       STOP taking these medications    ipratropium-albuterol  0.5-2.5 (3) MG/3ML Soln Commonly known as: DUONEB   omeprazole  40 MG capsule Commonly known as: PRILOSEC       TAKE these medications    dicyclomine  10 MG capsule Commonly known as: BENTYL  Take 1 capsule (10 mg total) by mouth 4 (four) times daily -  before meals and at bedtime.   feeding supplement Liqd Take 237 mLs by mouth 2 (two) times daily between meals.   fluticasone  50 MCG/ACT nasal spray Commonly known as: FLONASE  Place 1 spray into both nostrils daily.   multivitamin capsule Take 1 capsule by mouth daily.   phosphorus 155-852-130 MG tablet Commonly known  as: K PHOS  NEUTRAL Take 2 tablets (500 mg total) by mouth daily. Start taking on: March 15, 2024   ProAir  RespiClick 108 (90 Base) MCG/ACT Aepb Generic drug: Albuterol  Sulfate Inhale 2 puffs into the lungs every 6 (six) hours as needed (shortness of breath/wheezing).   Zypitamag  4 MG Tabs Generic drug: Pitavastatin  Magnesium  Take 1 tablet (4 mg total) by mouth daily.        Follow-up Information     Faythe Purchase, MD Follow up.   Specialty: Endocrinology Why: you have an appointment on Oct 3 at 2 PM Contact information: [address] [place] [address] [phone]                Discharge Exam:   Subjective: Feeling great and eager to go home!   Objective: Vitals:   03/13/24 2305 03/14/24 0435  BP:  (!) 145/57  Pulse:  81  Resp:  18  Temp:  98.9 F (37.2 C)  SpO2: 96% 95%    Intake/Output Summary (Last 24 hours) at 03/14/2024 1003 Last data filed at 03/14/2024 0600 Gross per 24 hour  Intake 480 ml  Output 1000 ml  Net -520 ml   Filed Weights   03/12/24 0500 03/13/24 0500 03/14/24 0500  Weight: 92.5 kg 94.8 kg 95.6 kg    Exam:  General:  Appears calm and comfortable and is in NAD Eyes:  normal lids, iris ENT:  grossly normal hearing, lips & tongue, mmm Cardiovascular:  RRR. No LE edema.  Respiratory:   CTA bilaterally with no wheezes/rales/rhonchi.  Normal respiratory effort. Abdomen:  soft, NT, ND Skin:  no rash or induration seen on limited exam Musculoskeletal:  grossly normal tone BUE/BLE, good ROM, no bony abnormality Psychiatric:  grossly normal mood and affect, speech fluent and appropriate, AOx3 Neurologic:  CN 2-12 grossly intact, moves all extremities in coordinated fashion  Data Reviewed: I have reviewed the patient's lab results since admission.  Pertinent labs for today include:   CO2 19, not clinically significant BUN 26/Creatinine 2.12/GFR 24 Calcium  10.3 WBC 9.7 Hgb 8.7    Condition at  discharge: improving  The results of significant diagnostics from this hospitalization (including imaging, microbiology, ancillary and laboratory) are listed below for reference.   Imaging Studies: NM Parathyroid  W/Spect Result Date: 03/11/2024 CLINICAL DATA:  Hyperparathyroidism. EXAM: NM PARATHYROID  SCINTIGRAPHY AND SPECT IMAGING TECHNIQUE: Following intravenous administration of radiopharmaceutical, early and 2-hour delayed planar images were obtained in the anterior projection. Delayed triplanar SPECT images were also obtained at 2 hours. RADIOPHARMACEUTICALS:  25.3 mCi Tc-74m Sestamibi IV COMPARISON:  None FINDINGS: Anterior planar imaging demonstrates normal washout activity from the thyroid  gland at 2 hour imaging. SPECT imaging of the head neck demonstrates no focal activity within the thyroid  bed above background mild thyroid  gland activity. IMPRESSION: No evidence of parathyroid  adenoma on planar or SPECT imaging. Electronically Signed   By: Jackquline Boxer M.D.   On: 03/11/2024 17:37   US  THYROID  Result Date: 03/11/2024 CLINICAL DATA:  Other.  2665 Hypercalcemia 2665 EXAM: THYROID  ULTRASOUND TECHNIQUE: Ultrasound examination of the thyroid  gland and adjacent soft tissues was performed. COMPARISON:  CT chest 08/19/2021. Carotid ultrasound duplex, 03/05/2013. FINDINGS: Parenchymal Echotexture: Mildly heterogenous Isthmus: 0.5 cm Right lobe: 3.8 x 1.8 x 2.1 cm Left lobe: 3.6 x 2.1 x 2.0 cm _________________________________________________________ Estimated total number of nodules >/= 1 cm: 2 Number of spongiform nodules >/=  2 cm not described below (TR1): 0 Number of mixed cystic and solid nodules >/= 1.5 cm not described below (TR2): 0 _________________________________________________________ Nodule # 2: Location: LEFT; Mid Maximum size: 1.0 cm; Other 2 dimensions: 1.0 x 0 8 cm Composition: solid/almost completely solid (2) Echogenicity: isoechoic (1) Shape: taller-than-wide (3) Margins:  ill-defined (0) Echogenic foci: none (0) ACR TI-RADS total points: 6. ACR TI-RADS risk category: TR4 (4-6 points). ACR TI-RADS recommendations: *Given size (>/= 1 - 1.4 cm) and appearance, a follow-up ultrasound in 1 year should be considered based on TI-RADS criteria. _________________________________________________________ Nodule # 4: Location: LEFT; Inferior Maximum size: 1.4 cm; Other 2 dimensions: 0.8 x 0.8 cm Composition: solid/almost completely solid (2) Echogenicity: cannot determine (1) Shape: not taller-than-wide (0) Margins: ill-defined (0) Echogenic foci: macrocalcifications (1) ACR TI-RADS total points: 4. ACR TI-RADS risk category: TR4 (4-6 points). ACR TI-RADS recommendations: *Given size (>/= 1 - 1.4 cm) and appearance, a follow-up ultrasound in 1 year should be considered based on TI-RADS criteria. _________________________________________________________ Additional sub-1 cm solid and cystic nodules scattered within the gland do not meet threshold for follow-up nor biopsy per current criteria. No cervical adenopathy or abnormal fluid collection within the imaged neck. This examination was not protocoled for, nor were any parathyroid  glands seen or evaluated. IMPRESSION: 1. Nonenlarged, mildly heterogeneous and multinodular thyroid  gland. 2. 1.0 cm mid and 1.4 cm LEFT inferior TR-4 thyroid  nodules. A follow-up ultrasound in 1 year should be considered based on TI-RADS criteria. 3. This examination was not protocoled for,  nor were any parathyroid  glands seen or evaluated. The above is in keeping with the ACR TI-RADS recommendations - J Am Coll Radiol 2017;14:587-595. Electronically Signed   By: Thom Hall M.D.   On: 03/11/2024 07:08   DG CHEST PORT 1 VIEW Result Date: 03/09/2024 CLINICAL DATA:  Dyspnea EXAM: PORTABLE CHEST 1 VIEW COMPARISON:  Prior chest x-ray 03/06/2024 FINDINGS: Stable cardiac and mediastinal contours. Slightly increased pulmonary vascular congestion with Kerley B-lines in the  periphery consistent with mild interstitial pulmonary edema. Increased bibasilar airspace opacities favored to reflect a combination of atelectasis and perhaps small layering effusions. No focal airspace infiltrate. No pneumothorax. No acute osseous abnormality. Calcifications visualized along the thoracic aorta. IMPRESSION: 1. Increased pulmonary vascular congestion with mild interstitial edema. Findings suggest mild CHF. 2. Bibasilar airspace opacities favored to reflect small layering pleural effusions and atelectasis. Electronically Signed   By: Wilkie Lent M.D.   On: 03/09/2024 12:12   DG Bone Survey Met Result Date: 03/07/2024 CLINICAL DATA:  Hypercalcemia.  Bilateral lower leg pain. EXAM: METASTATIC BONE SURVEY COMPARISON:  None Available. FINDINGS: Lateral skull radiograph: No focal lytic lesion. No depressed fracture of the cranial vault. Probable embolization coil noted overlying the sellar region. Bilateral shoulders: No focal lytic lesion. Mild degenerative changes of bilateral glenohumeral and acromioclavicular joints. Bilateral humeri: No focal lytic lesion. No acute fracture or dislocation. Bilateral forearm: No focal lytic lesion. No acute fracture or dislocation. Cervical spine: No focal lytic lesion. Normal cervical spinal curvature. Mild-to-moderate multilevel degenerative changes noted, most pronounced at C3-4 and C5-6 levels. Thoracic spine: No focal lytic lesion. Normal thoracic curvature. Mild-to-moderate multilevel degenerative changes noted. No acute vertebral compression fracture. Lumbar spine: No focal lytic lesion. Normal lumbar lordosis. No acute compression fracture. Mild multilevel degenerative changes characterized by facet arthropathy and marginal osteophyte formation. Pelvis: No focal lytic lesion. No acute fracture or dislocation. Sacral arcuate lines are intact. Changes of chronic pubic symphysisitis. Mild degenerative changes of bilateral hip joints without significant  joint space narrowing. Bilateral femur: No focal lytic lesion. No acute fracture or dislocation. Bilateral legs: No focal lytic lesion. No acute fracture or dislocation. Bilateral ankle mortises are intact. Chest: No focal lytic lesion. Bilateral lungs are clear. Bilateral lateral costophrenic angle are clear. Normal cardiomediastinal silhouette. IMPRESSION: *No focal lytic lesion seen. Multiple chronic findings, as described above. Electronically Signed   By: Ree Molt M.D.   On: 03/07/2024 17:04   US  RENAL Result Date: 03/06/2024 EXAM: US  Retroperitoneum Complete, Renal. CLINICAL HISTORY: 409830 AKI (acute kidney injury) (HCC) 409830; 2665 Hypercalcemia 2665. TECHNIQUE: Real-time ultrasound of the retroperitoneum (complete) with image documentation. COMPARISON: CT abdomen / pelvis dated 03/04/2024. FINDINGS: RIGHT KIDNEY: Measures 12.7 x 5.5 x 3.9 cm (calculated volume 142 ml). Echogenic renal parenchyma, suggesting medical renal disease. No hydronephrosis, renal stone, or mass visualized. LEFT KIDNEY: Measures 11.2 x 4.8 x 4.3 cm (calculated volume 121 ml). Echogenic renal parenchyma, suggesting medical renal disease. No hydronephrosis, renal stone, or mass visualized. BLADDER: Unremarkable as visualized. IMPRESSION: 1. No acute findings. 2. Suspected chronic medical renal disease. Electronically signed by: Pinkie Pebbles MD 03/06/2024 09:10 PM EDT RP Workstation: [id number]   DG Chest Port 1 View Result Date: 03/06/2024 CLINICAL DATA:  Acute renal failure. EXAM: PORTABLE CHEST 1 VIEW COMPARISON:  09/23/2023 FINDINGS: Normal mediastinum and cardiac silhouette. Normal pulmonary vasculature. No evidence of effusion, infiltrate, or pneumothorax. No acute bony abnormality. IMPRESSION: No acute cardiopulmonary process. Electronically Signed   By: Jackquline Malva HERO.D.  On: 03/06/2024 12:21   CT ABDOMEN PELVIS W CONTRAST Result Date: 03/04/2024 CLINICAL DATA:  Black stool. EXAM: CT ABDOMEN AND PELVIS  WITH CONTRAST TECHNIQUE: Multidetector CT imaging of the abdomen and pelvis was performed using the standard protocol following bolus administration of intravenous contrast. RADIATION DOSE REDUCTION: This exam was performed according to the departmental dose-optimization program which includes automated exposure control, adjustment of the mA and/or kV according to patient size and/or use of iterative reconstruction technique. CONTRAST:  85mL OMNIPAQUE  IOHEXOL  300 MG/ML  SOLN COMPARISON:  September 17, 2022. FINDINGS: Lower chest: No acute abnormality. Hepatobiliary: Hepatic steatosis and nodular contours suggesting hepatic cirrhosis. No cholelithiasis or biliary dilatation is noted. Left hepatic cyst is noted. Pancreas: Unremarkable. No pancreatic ductal dilatation or surrounding inflammatory changes. Spleen: Normal in size without focal abnormality. Adrenals/Urinary Tract: Adrenal glands are unremarkable. Kidneys are normal, without renal calculi, focal lesion, or hydronephrosis. Bladder is unremarkable. Stomach/Bowel: Stomach is within normal limits. Appendix appears normal. No evidence of bowel wall thickening, distention, or inflammatory changes. Sigmoid diverticulosis without inflammation. Vascular/Lymphatic: Aortic atherosclerosis. No enlarged abdominal or pelvic lymph nodes. Reproductive: Uterus and bilateral adnexa are unremarkable. Other: No abdominal wall hernia or abnormality. No abdominopelvic ascites. Musculoskeletal: No acute or significant osseous findings. IMPRESSION: 1. Hepatic steatosis and nodular hepatic contours suggesting hepatic cirrhosis. 2. Sigmoid diverticulosis without inflammation. 3. Aortic atherosclerosis. Aortic Atherosclerosis (ICD10-I70.0). Electronically Signed   By: Lynwood Landy Raddle M.D.   On: 03/04/2024 15:13    Microbiology: Results for orders placed or performed in visit on 03/04/24  Urine Culture     Status: None   Collection Time: 03/04/24 11:56 AM   Specimen: Urine   Urine   Result Value Ref Range Status   Urine Culture, Routine Final report  Final   Organism ID, Bacteria No growth  Final    Labs: CBC: Recent Labs  Lab 03/10/24 0544 03/11/24 0545 03/12/24 0538 03/13/24 0508 03/14/24 0616  WBC 9.0 8.4 11.5* 13.1* 9.7  NEUTROABS  --   --  7.7  --   --   HGB 7.2* 8.5* 9.0* 9.3* 8.7*  HCT 22.2* 26.5* 26.9* 28.9* 28.2*  MCV 100.9* 98.1 96.8 100.0 102.5*  PLT 215 245 312 370 342   Basic Metabolic Panel: Recent Labs  Lab 03/09/24 0530 03/10/24 0544 03/10/24 1524 03/11/24 0545 03/12/24 0539 03/13/24 0508 03/14/24 0616  NA 145 143 142 143 143 144 142  K 3.0* 2.9* 3.4* 3.1* 2.9* 3.9 4.4  CL 112* 108 107 108 106 111 110  CO2 20* 21* 22 22 21* 20* 19*  GLUCOSE 92 91 93 89 91 90 84  BUN 31* 26* 26* 25* 25* 25* 26*  CREATININE 3.15* 2.87* 2.86* 2.74* 2.73* 2.48* 2.12*  CALCIUM  11.3* 10.7* 11.0* 11.0* 10.9* 10.5* 10.3  MG 1.7  --  1.8  --   --   --   --   PHOS 2.2* 2.1*  --  1.4* 2.2* 2.4* 2.2*   Liver Function Tests: Recent Labs  Lab 03/12/24 0538 03/12/24 0539 03/13/24 0508 03/13/24 1825 03/14/24 0616  AST 47*  --   --  49*  --   ALT 35  --   --  42  --   ALKPHOS 169*  --   --  184*  --   BILITOT 0.8  --   --  0.4  --   PROT 7.3  --   --  6.7  --   ALBUMIN  4.0 4.0 4.0 3.7 3.9  CBG: No results for input(s): GLUCAP in the last 168 hours.  Discharge time spent: greater than 30 minutes.  Signed: Delon Herald, MD [facility] 03/14/2024

## 2024-03-14 NOTE — Plan of Care (Signed)

## 2024-03-14 NOTE — TOC Transition Note (Signed)
 Transition of Care [facility]) - Discharge Note   Patient Details  Name: Marissa Willis MRN: [id number] Date of Birth: Nov 15, 1955  Transition of Care [facility]) CM/SW Contact:  Sonda Manuella Quill, RN Phone Number: 03/14/2024, 11:39 AM   Clinical Narrative:    D/C orders received; no TOC needs.   Final next level of care: Home/Self Care Barriers to Discharge: [facility] Health Outpatient Resources   Patient Goals and CMS Choice Patient states their goals for this hospitalization and ongoing recovery are:: home CMS Medicare.gov Compare Post Acute Care list provided to:: Patient   [place] ownership interest in [facility].provided to:: Patient    Discharge Placement                       Discharge Plan and Services Additional resources added to the After Visit Summary for     Discharge Planning Services: CM Consult            DME Arranged: N/A DME Agency: NA       HH Arranged: NA HH Agency: NA        Social Drivers of Health (SDOH) Interventions SDOH Screenings   Food Insecurity: No Food Insecurity (03/12/2024)  Housing: Low Risk  (03/12/2024)  Transportation Needs: No Transportation Needs (03/12/2024)  Utilities: Not At Risk (03/12/2024)  Alcohol Screen: Low Risk  (01/03/2024)  Depression (PHQ2-9): Medium Risk (02/25/2024)  Financial Resource Strain: Medium Risk (01/03/2024)  Physical Activity: Inactive (01/03/2024)  Social Connections: Moderately Integrated (03/06/2024)  Recent Concern: Social Connections - Moderately Isolated (01/03/2024)  Stress: No Stress Concern Present (01/03/2024)  Tobacco Use: Medium Risk (03/08/2024)     Readmission Risk Interventions    03/12/2024    5:23 PM 03/09/2024    1:18 PM  Readmission Risk Prevention Plan  Transportation Screening Complete Complete  PCP or Specialist Appt within 5-7 Days Complete Complete  Home Care Screening Complete Complete  Medication Review (RN CM) Complete Complete

## 2024-03-14 NOTE — Progress Notes (Signed)
 Patient ID: Marissa Willis, female   DOB: 09/30/1955, 68 y.o.   MRN: [id number] [facility] Progress Note   Assessment/ Plan:   1. Acute kidney Injury on chronic kidney disease stage IIIa: Appears to be hemodynamically mediated in the setting of hypercalcemia and volume contraction in the setting of ACE inhibitor/HCTZ.  Nonoliguric with improvement of creatinine seen on labs this morning.  Plan to discharge her home today if labs show continued improvement of renal function and calcium  control.  She is scheduled to see me for outpatient nephrology follow-up on 03/26/2024 with labs 1 week prior to visit. 2.  Hypercalcemia: Improved with medical management earlier and with minimal response to forced diuresis yesterday.  Parathyroid  nuclear scan negative for adenoma, previous SPEP negative for M spike.  LDH level marginally elevated but imaging done so far without evidence of intra-abdominal or perihilar adenopathy to suggest lymphoma.  PTH related peptide and 1-25-hydroxy vitamin D  level pending. 3.  Hypokalemia: Secondary to IV fluids and forced diuresis.  Replaced via oral routes and confirmed that magnesium  stores are adequate. 5.  Acute on chronic blood loss anemia: With Hemoccult positive stool for which she underwent EGD showing a single nonbleeding angioectasia treated with APC and prior treatment of duodenal ectasias with APC as well.  She also has an incidental finding of cirrhosis.  Subjective:   Denies any acute events overnight.  No chest pain or shortness of breath, no abdominal spasms.   Objective:   BP (!) 145/57 (BP Location: Left Arm)   Pulse 81   Temp 98.9 F (37.2 C) (Oral)   Resp 18   Ht 5' 2 (1.575 m)   Wt 95.6 kg   SpO2 95%   BMI 38.55 kg/m   Intake/Output Summary (Last 24 hours) at 03/14/2024 0739 Last data filed at 03/14/2024 0600 Gross per 24 hour  Intake 700 ml  Output 1000 ml  Net -300 ml   Weight change: 0.798 kg  Physical Exam: Gen:  Comfortably sitting in recliner, watching television CVS: Pulse regular rhythm, normal rate, normal S1 and S2. Resp: Clear to auscultation bilaterally, no rales/rhonchi Abd: Soft, obese/protuberant, nontender, bowel sounds normal Ext: No palpable ankle edema  Imaging: No results found.   Labs: BMET Recent Labs  Lab 03/08/24 0636 03/09/24 0530 03/10/24 0544 03/10/24 1524 03/11/24 0545 03/12/24 0539 03/13/24 0508  NA 144 145 143 142 143 143 144  K 3.5 3.0* 2.9* 3.4* 3.1* 2.9* 3.9  CL 110 112* 108 107 108 106 111  CO2 22 20* 21* 22 22 21* 20*  GLUCOSE 99 92 91 93 89 91 90  BUN 43* 31* 26* 26* 25* 25* 25*  CREATININE 4.07* 3.15* 2.87* 2.86* 2.74* 2.73* 2.48*  CALCIUM  13.1* 11.3* 10.7* 11.0* 11.0* 10.9* 10.5*  PHOS 2.7 2.2* 2.1*  --  1.4* 2.2* 2.4*   CBC Recent Labs  Lab 03/10/24 0544 03/11/24 0545 03/12/24 0538 03/13/24 0508  WBC 9.0 8.4 11.5* 13.1*  NEUTROABS  --   --  7.7  --   HGB 7.2* 8.5* 9.0* 9.3*  HCT 22.2* 26.5* 26.9* 28.9*  MCV 100.9* 98.1 96.8 100.0  PLT 215 245 312 370    Medications:     Chlorhexidine  Gluconate Cloth  6 each Topical Daily   dicyclomine   10 mg Oral TID AC & HS   feeding supplement  237 mL Oral BID BM   fluticasone   1 spray Each Nare Daily   folic acid   1 mg Oral Daily  Influenza vac split trivalent PF  0.5 mL Intramuscular Tomorrow-1000   multivitamin with minerals  1 tablet Oral Daily   pantoprazole   40 mg Oral BID   phosphorus  500 mg Oral Daily   thiamine   100 mg Oral Daily   Or   thiamine   100 mg Intravenous Daily    Gordy Blanch, MD 03/14/2024, 7:39 AM

## 2024-03-14 NOTE — Progress Notes (Signed)
 Verified calcitonin order with MD Barbarann. Calcitonin is taken off DC MED LIST, patient knows that she does not need to take it at home.

## 2024-03-14 NOTE — Evaluation (Signed)
 Occupational Therapy Evaluation and Discharge Patient Details Name: Marissa Willis MRN: [id number] DOB: 1956/06/12 Today's Date: 03/14/2024   History of Present Illness   68yo female  who presented on 9/19 from her PCP office with AKI and hypercalcemia,  developed pulmonary edema, GI bleeding and underwent EGD with AVM cauterization. PMH: COPD, complex partial seizure d/o, MCI, preDM, HLD, OSA on CPAP, class 2 obesity, and cirrhosis     Clinical Impressions Pt is at Ind baseline level of function with ADLs and ADL mobility, Sup to step in and out of shower. Pt with no c/o pain and with no LOB or difficulty during ADL mobility tasks using no ADs. Pt educated on energy conservation strategies with handouts provided. Pt; O2 SATs remained 96-97% throughout activity. Pt eager to d/c home this afternoon. All education completed and no further acute r follow OT services are indicated at this time     If plan is discharge home, recommend the following:   Assist for transportation;Assistance with cooking/housework     Functional Status Assessment   Patient has not had a recent decline in their functional status     Equipment Recommendations   None recommended by OT     Recommendations for Other Services         Precautions/Restrictions   Precautions Precautions: Fall Precaution/Restrictions Comments: seizure Restrictions Weight Bearing Restrictions Per Provider Order: No     Mobility Bed Mobility               General bed mobility comments: pt in chair    Transfers Overall transfer level: Needs assistance Equipment used: 1 person hand held assist, None Transfers: Sit to/from Stand Sit to Stand: Supervision, Modified independent (Device/Increase time)                  Balance Overall balance assessment: Needs assistance Sitting-balance support: No upper extremity supported, Feet supported Sitting balance-Leahy Scale: Good     Standing balance  support: No upper extremity supported, During functional activity Standing balance-Leahy Scale: Fair                             ADL either performed or assessed with clinical judgement   ADL Overall ADL's : Needs assistance/impaired Eating/Feeding: Independent   Grooming: Wash/dry hands;Wash/dry face;Modified independent;Standing   Upper Body Bathing: Supervision/ safety;Modified independent   Lower Body Bathing: Supervison/ safety;Modified independent   Upper Body Dressing : Modified independent   Lower Body Dressing: Modified independent   Toilet Transfer: Modified Independent;Ambulation;Regular Toilet;Grab bars   Toileting- Clothing Manipulation and Hygiene: Modified independent   Tub/ Shower Transfer: Supervision/safety;Ambulation;Grab bars   Functional mobility during ADLs: Supervision/safety;Modified independent General ADL Comments: pt educated on energy conservation strategies with handouts provided     Vision Baseline Vision/History: 1 Wears glasses Ability to See in Adequate Light: 0 Adequate Patient Visual Report: No change from baseline       Perception         Praxis         Pertinent Vitals/Pain Pain Assessment Pain Assessment: No/denies pain     Extremity/Trunk Assessment Upper Extremity Assessment Upper Extremity Assessment: Overall WFL for tasks assessed   Lower Extremity Assessment Lower Extremity Assessment: Defer to PT evaluation   Cervical / Trunk Assessment Cervical / Trunk Assessment: Normal   Communication Communication Communication: No apparent difficulties   Cognition Arousal: Alert Behavior During Therapy: [facility] for tasks assessed/performed  Following commands: Intact       Cueing  General Comments          Exercises     Shoulder Instructions      Home Living Family/patient expects to be discharged to:: Private residence Living Arrangements:  Spouse/significant other Available Help at Discharge: Family Type of Home: House Home Access: Ramped entrance     Home Layout: One level     Bathroom Shower/Tub: Other (comment) (walk in tub)   Bathroom Toilet: Standard     Home Equipment: BSC/3in1;Rollator (4 wheels);Grab bars - tub/shower          Prior Functioning/Environment Prior Level of Function : Independent/Modified Independent             Mobility Comments: Ind, no AD ADLs Comments: Ind with ADLs, IADLs, home mgt, cooking    OT Problem List: Decreased activity tolerance   OT Treatment/Interventions:        OT Goals(Current goals can be found in the care plan section)   Acute Rehab OT Goals Patient Stated Goal: go home OT Goal Formulation: All assessment and education complete, DC therapy   OT Frequency:       Co-evaluation              AM-PAC OT 6 Clicks Daily Activity     Outcome Measure Help from another person eating meals?: None Help from another person taking care of personal grooming?: None Help from another person toileting, which includes using toliet, bedpan, or urinal?: None Help from another person bathing (including washing, rinsing, drying)?: None Help from another person to put on and taking off regular upper body clothing?: None Help from another person to put on and taking off regular lower body clothing?: None 6 Click Score: 24   End of Session Equipment Utilized During Treatment: Gait belt Nurse Communication: Mobility status  Activity Tolerance: Patient tolerated treatment well Patient left: in chair  OT Visit Diagnosis: Unsteadiness on feet (R26.81)                Time: 8984-8960 OT Time Calculation (min): 24 min Charges:  OT General Charges $OT Visit: 1 Visit OT Evaluation $OT Eval Low Complexity: 1 Low OT Treatments $Therapeutic Activity: 8-22 mins    Jacques Karna Loose 03/14/2024, 12:22 PM

## 2024-03-16 ENCOUNTER — Telehealth: Payer: Self-pay

## 2024-03-16 DIAGNOSIS — K7460 Unspecified cirrhosis of liver: Secondary | ICD-10-CM

## 2024-03-16 DIAGNOSIS — N179 Acute kidney failure, unspecified: Secondary | ICD-10-CM

## 2024-03-16 LAB — PTH-RELATED PEPTIDE: PTH-related peptide: <2 pmol/L

## 2024-03-16 LAB — CALCITRIOL (1,25 DI-OH VIT D): Vit D, 1,25-Dihydroxy: 11.6 pg/mL — ABNORMAL LOW (ref 24.8–81.5)

## 2024-03-16 NOTE — Telephone Encounter (Signed)
 Copied from CRM #[id number]. Topic: Clinical - Lab/Test Results >> Mar 16, 2024  8:21 AM Myrick T wrote: Reason for CRM: Please call patient after orders have been placed as she will need to have labs done before her HFU per Nephrologist. She will see the specialist on 10/9

## 2024-03-16 NOTE — Transitions of Care (Post Inpatient/ED Visit) (Signed)
   03/16/2024  Name: Marissa Willis MRN: [id number] DOB: 1956/04/04  Today's TOC FU Call Status: Today's TOC FU Call Status:: Unsuccessful Call (1st Attempt) Unsuccessful Call (1st Attempt) Date: 03/16/24  Attempted to reach the patient regarding the most recent Inpatient/ED visit.  Follow Up Plan: Additional outreach attempts will be made to reach the patient to complete the Transitions of Care (Post Inpatient/ED visit) call.   Shona Prow RN, CCM [place]  VBCI-Population Health RN Care Manager [phone]

## 2024-03-16 NOTE — Transitions of Care (Post Inpatient/ED Visit) (Signed)
 03/16/2024  Name: Marissa Willis MRN: [id number] DOB: 02-11-1956  Today's TOC FU Call Status: Today's TOC FU Call Status:: Successful TOC FU Call Completed TOC FU Call Complete Date: 03/16/24 Patient's Name and Date of Birth confirmed.  Transition Care Management Follow-up Telephone Call How have you been since you were released from the hospital?: Better Any questions or concerns?: Yes Patient Questions/Concerns:: patient unsure why she needs to see endocrinology - reviewed chart and advised probably related to hypercalcemia based on record  Items Reviewed: Did you receive and understand the discharge instructions provided?: Yes Medications obtained,verified, and reconciled?: Yes (Medications Reviewed) Any new allergies since your discharge?: No Dietary orders reviewed?: NA Do you have support at home?: Yes People in Home [RPT]: spouse Name of Support/Comfort Primary Source: spouse can assist as needed  Medications Reviewed Today: Medications Reviewed Today     Reviewed by Lauro Shona LABOR, RN (Registered Nurse) on 03/16/24 at 1728  Med List Status: <None>   Medication Order Taking? Sig Documenting Provider Last Dose Status Informant  Albuterol  Sulfate (PROAIR  RESPICLICK) 108 (90 Base) MCG/ACT AEPB [id number] Yes Inhale 2 puffs into the lungs every 6 (six) hours as needed (shortness of breath/wheezing). Antoniette Vermell CROME, PA-C  Active Self, Pharmacy Records  dicyclomine  (BENTYL ) 10 MG capsule [id number]  Take 1 capsule (10 mg total) by mouth 4 (four) times daily -  before meals and at bedtime.  Patient not taking: Reported on 03/16/2024   Barbarann Nest, MD  Active   feeding supplement (ENSURE PLUS HIGH PROTEIN) LIQD [id number]  Take 237 mLs by mouth 2 (two) times daily between meals.  Patient not taking: Reported on 03/16/2024   Barbarann Nest, MD  Active   fluticasone  (FLONASE ) 50 MCG/ACT nasal spray [id number] Yes Place 1 spray into both nostrils daily. Antoniette Vermell CROME, PA-C   Active Self, Pharmacy Records  lisinopril -hydrochlorothiazide  (ZESTORETIC ) 20-25 MG tablet [id number]  Take 1 tablet by mouth daily.  Patient not taking: Reported on 03/16/2024   Breeback, Jade L, PA-C  Active Self, Pharmacy Records  Multiple Vitamin (MULTIVITAMIN) capsule [id number] Yes Take 1 capsule by mouth daily. [provider]  Active Self, Pharmacy Records  phosphorus (K PHOS  NEUTRAL) 155-852-130 MG tablet [id number] Yes Take 2 tablets (500 mg total) by mouth daily. Barbarann Nest, MD  Active   ZYPITAMAG  4 MG TABS [id number] Yes Take 1 tablet (4 mg total) by mouth daily. Antoniette Vermell CROME, PA-C  Active Self, Pharmacy Records            Home Care and Equipment/Supplies: Were Home Health Services Ordered?: No Any new equipment or medical supplies ordered?: No  Functional Questionnaire: Do you need assistance with bathing/showering or dressing?: Yes (has walk in shower and husband is helping related to fatigue) Do you need assistance with meal preparation?: No Do you need assistance with eating?: No Do you have difficulty maintaining continence: Yes (occasional bladder incontinence with cough) Do you need assistance with getting out of bed/getting out of a chair/moving?: No Do you have difficulty managing or taking your medications?: No  Follow up appointments reviewed: PCP Follow-up appointment confirmed?: Yes Date of PCP follow-up appointment?: 03/18/24 Follow-up Provider: Vermell Antoniette Specialist [facility] Follow-up appointment confirmed?: Yes Date of Specialist follow-up appointment?: 03/20/24 Follow-Up Specialty Provider:: Dr Faythe, Endo - 10/9 nephrology  Dr Tobie Do you need transportation to your follow-up appointment?: No Do you understand care options if your condition(s) worsen?: Yes-patient verbalized understanding  SDOH Interventions Today    Flowsheet Row Most Recent  Value  SDOH Interventions   Food Insecurity Interventions Intervention Not Indicated   Housing Interventions Intervention Not Indicated  Transportation Interventions Intervention Not Indicated  Utilities Interventions Intervention Not Indicated    Goals Addressed             This Visit's Progress    VBCI Transitions of Care (TOC) Care Plan       Problems:  Recent Hospitalization for treatment of AKI (acute kidney injury) w/ 9/21 EGD  Monitor for alcohol withdrawal - patient states she was previously agitated - last drink was 03/02/24 - was drinking 3 glasses of Kahlua and Tequila, decreased to 2 glasses, then 1 and now is not drinking   Goal:  Over the next 30 days, the patient will not experience hospital readmission  Interventions:  Transitions of Care: Doctor Visits  - discussed the importance of doctor visits Reviewed MD appts - PCP 03/18/24, endo 03/20/24, nepho 03/26/24  Reviewed medications and communication with Mds about restarting paused medication  Patient Self Care Activities:  Attend all scheduled provider appointments Call pharmacy for medication refills 3-7 days in advance of running out of medications Call provider office for new concerns or questions  Notify RN Care Manager of [facility] call rescheduling needs Participate in Transition of Care Program/Attend TOC scheduled calls Take medications as prescribed    Plan:  Telephone follow up appointment with care management team member scheduled for:  03/24/24 in the morning  The patient has been provided with contact information for the care management team and has been advised to call with any health related questions or concerns.         Shona Prow RN, CCM [place]  VBCI-Population Health RN Care Manager [phone]

## 2024-03-16 NOTE — Patient Instructions (Signed)
 Visit Information  Thank you for taking time to visit with me today. Please don't hesitate to contact me if I can be of assistance to you before our next scheduled telephone appointment.  Our next appointment is by telephone on 03/24/24 in the  morning  Following is a copy of your care plan:   Goals Addressed             This Visit's Progress    VBCI Transitions of Care (TOC) Care Plan       Problems:  Recent Hospitalization for treatment of AKI (acute kidney injury) w/ 9/21 EGD  Monitor for alcohol withdrawal - patient states she was previously agitated - last drink was 03/02/24 - was drinking 3 glasses of Kahlua and Tequila, decreased to 2 glasses, then 1 and now is not drinking   Goal:  Over the next 30 days, the patient will not experience hospital readmission  Interventions:  Transitions of Care: Doctor Visits  - discussed the importance of doctor visits Reviewed MD appts - PCP 03/18/24, endo 03/20/24, nepho 03/26/24  Reviewed medications and communication with Mds about restarting paused medication  Patient Self Care Activities:  Attend all scheduled provider appointments Call pharmacy for medication refills 3-7 days in advance of running out of medications Call provider office for new concerns or questions  Notify RN Care Manager of [facility] call rescheduling needs Participate in Transition of Care Program/Attend TOC scheduled calls Take medications as prescribed    Plan:  Telephone follow up appointment with care management team member scheduled for:  03/24/24 in the morning  The patient has been provided with contact information for the care management team and has been advised to call with any health related questions or concerns.         Patient verbalizes understanding of instructions and care plan provided today and agrees to view in MyChart. Active MyChart status and patient understanding of how to access instructions and care plan via MyChart confirmed with patient.      Telephone follow up appointment with care management team member scheduled for: 03/24/24 The patient has been provided with contact information for the care management team and has been advised to call with any health related questions or concerns.   Please call the care guide team at [phone] if you need to cancel or reschedule your appointment.   Please call the Suicide and Crisis Lifeline: 988 call 911 if you are experiencing a Mental Health or Behavioral Health Crisis or need someone to talk to.  Shona Prow RN, CCM [place]  VBCI-Population Health RN Care Manager [phone]

## 2024-03-17 NOTE — Telephone Encounter (Signed)
 All requested lab showing in patient chart by outside providers resulted 03/13/2024 and 03/14/2024 Do we still need to order these tests?

## 2024-03-18 ENCOUNTER — Encounter: Payer: Self-pay | Admitting: Physician Assistant

## 2024-03-18 ENCOUNTER — Ambulatory Visit (INDEPENDENT_AMBULATORY_CARE_PROVIDER_SITE_OTHER): Admitting: Physician Assistant

## 2024-03-18 VITALS — BP 140/70 | HR 61 | Ht 62.0 in | Wt 210.0 lb

## 2024-03-18 DIAGNOSIS — N179 Acute kidney failure, unspecified: Secondary | ICD-10-CM

## 2024-03-18 DIAGNOSIS — E876 Hypokalemia: Secondary | ICD-10-CM | POA: Insufficient documentation

## 2024-03-18 DIAGNOSIS — R7303 Prediabetes: Secondary | ICD-10-CM

## 2024-03-18 DIAGNOSIS — I10 Essential (primary) hypertension: Secondary | ICD-10-CM

## 2024-03-18 DIAGNOSIS — N1831 Chronic kidney disease, stage 3a: Secondary | ICD-10-CM | POA: Insufficient documentation

## 2024-03-18 DIAGNOSIS — D500 Iron deficiency anemia secondary to blood loss (chronic): Secondary | ICD-10-CM

## 2024-03-18 DIAGNOSIS — Z09 Encounter for follow-up examination after completed treatment for conditions other than malignant neoplasm: Secondary | ICD-10-CM

## 2024-03-18 NOTE — Telephone Encounter (Signed)
 Patient was seen in office today with Vermell Bologna, PA

## 2024-03-18 NOTE — Progress Notes (Signed)
 Established Patient Office Visit  Subjective   Patient ID: Marissa Willis, female    DOB: 03-31-1956  Age: 68 y.o. MRN: [id number]  Chief Complaint  Patient presents with   Hospitalization Follow-up    HPI Discussed the use of AI scribe software for clinical note transcription with the patient, who gave verbal consent to proceed.  History of Present Illness Marissa Willis is a 68 year old female with COPD, HTN, HLD who presents for follow-up after recent hospitalization for AKI, Hypercalcemia, Anemia, repair of colonic malformations.   - Recent hospitalization for electrolyte disturbances, including hypercalcemia and hypokalemia - Currently taking potassium supplements for hypokalemia - Blood pressure medications (lisinopril  and hydrochlorothiazide ) paused due to recent events - Statin therapy restarted - No medications taken on the morning of the visit  Anemia and fatigue - Hospitalized for anemia with low hemoglobin, received blood transfusion with slight improvement in hemoglobin.  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    ROS See HPI.    Objective:     BP (!) 140/70 (BP Location: Right Arm, Patient Position: Sitting, Cuff Size: Normal)   Pulse 61   Ht 5' 2 (1.575 m)   Wt 210 lb (95.3 kg)   SpO2 99%   BMI 38.41 kg/m  BP  Readings from Last 3 Encounters:  03/18/24 (!) 140/70  03/14/24 (!) 145/57  03/04/24 (!) 120/30   Wt Readings from Last 3 Encounters:  03/18/24 210 lb (95.3 kg)  03/14/24 210 lb 12.2 oz (95.6 kg)  03/04/24 210 lb (95.3 kg)      Physical Exam Constitutional:      Appearance: Normal appearance. She is obese.  HENT:     Head: Normocephalic.  Cardiovascular:     Rate and Rhythm: Normal rate and regular rhythm.  Pulmonary:     Effort: Pulmonary effort is normal.     Breath sounds: Normal breath sounds.  Abdominal:     General: There is no distension.     Palpations: Abdomen is soft. There is no mass.     Tenderness: There is no abdominal tenderness. There is no right CVA tenderness, left CVA tenderness, guarding or rebound.  Musculoskeletal:     Right lower leg: No edema.     Left lower leg: No edema.  Neurological:     General: No focal deficit present.     Mental Status: She is alert and oriented to person, place, and time.  Psychiatric:        Mood and Affect: Mood normal.       The 10-year ASCVD risk score (Arnett DK, et al., 2019) is: 17%    Assessment & Plan:  .Marissa Willis was seen today for hospitalization follow-up.  Diagnoses and all orders for this visit:  Hospital discharge follow-up  Hypercalcemia -     CMP14+EGFR -     CBC w/Diff/Platelet -     Fe+TIBC+Fer  AKI (acute kidney injury) -     CMP14+EGFR -  CBC w/Diff/Platelet -     Fe+TIBC+Fer  Iron deficiency anemia due to chronic blood loss -     CBC w/Diff/Platelet -     Fe+TIBC+Fer  Pre-diabetes  Benign essential hypertension  CKD stage 3a, GFR 45-59 ml/min (HCC)  Hypokalemia    Assessment & Plan Recent hospitalization for acute kidney injury and electrolyte disturbances Kidney function improved post-discharge(GFR 17 at admission and 24 at discharge, baseline was 54); continued monitoring required. Dialysis considered but not initiated due to improvement.  - Perform blood  work today to assess kidney function and electrolyte levels. - Coordinate with nephrologist for follow-up appointment on October 9th. - Monitor trends in kidney function to determine if further intervention is needed.  Hypercalcemia, evaluation ongoing Hypercalcemia noted with unclear etiology; endocrinologist evaluation pending. High calcium  levels can cause systemic effects. - Follow up with endocrinologist on Friday for further evaluation of hypercalcemia. - Monitor calcium  levels in today's blood work. - Discuss with nephrologist and endocrinologist about resuming hydrochlorothiazide  based on calcium  levels.  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 Endoscopy revealed angiodysplasia, cauterized to prevent bleeding. - Perform blood work today to assess current hemoglobin levels. - Monitor for improvement in hemoglobin levels post-discharge. - Evaluate need for further intervention based on lab results.  Hypokalemia, on potassium supplementation Hypokalemia managed with potassium supplementation. - Continue potassium supplementation. - Check potassium levels in today's blood work.  Hypertension, antihypertensive therapy paused due to hypercalcemia and kidney function Antihypertensive therapy paused due to hypercalcemia and kidney function concerns. Resumption depends on further evaluation. - Discuss with nephrologist and endocrinologist about resuming antihypertensive therapy based on lab results and evaluation. - BP not to goal but not way out of range today.  Fatigue and exertional dyspnea, likely multifactorial Fatigue and dyspnea likely due to anemia, recent hospitalization, and recovery. Symptoms improving but persistent. - Monitor symptoms and assess improvement with correction of anemia and electrolyte imbalances. - Evaluate need for further intervention based on  symptom progression.     Return if symptoms worsen or fail to improve.    Marissa Teal, PA-C

## 2024-03-18 NOTE — Patient Instructions (Signed)
 Will call with lab results.

## 2024-03-19 LAB — CBC WITH DIFFERENTIAL/PLATELET
Basophils Absolute: 0.1 x10E3/uL (ref 0.0–0.2)
Basos: 1 %
EOS (ABSOLUTE): 0.2 x10E3/uL (ref 0.0–0.4)
Eos: 2 %
Hematocrit: 28.4 % — ABNORMAL LOW (ref 34.0–46.6)
Hemoglobin: 9 g/dL — ABNORMAL LOW (ref 11.1–15.9)
Immature Grans (Abs): 0 x10E3/uL (ref 0.0–0.1)
Immature Granulocytes: 0 %
Lymphocytes Absolute: 1.9 x10E3/uL (ref 0.7–3.1)
Lymphs: 24 %
MCH: 31.9 pg (ref 26.6–33.0)
MCHC: 31.7 g/dL (ref 31.5–35.7)
MCV: 101 fL — ABNORMAL HIGH (ref 79–97)
Monocytes Absolute: 0.6 x10E3/uL (ref 0.1–0.9)
Monocytes: 8 %
Neutrophils Absolute: 5 x10E3/uL (ref 1.4–7.0)
Neutrophils: 64 %
Platelets: 343 x10E3/uL (ref 150–450)
RBC: 2.82 x10E6/uL — ABNORMAL LOW (ref 3.77–5.28)
RDW: 12.3 % (ref 11.7–15.4)
WBC: 7.8 x10E3/uL (ref 3.4–10.8)

## 2024-03-19 LAB — CMP14+EGFR
ALT: 40 IU/L — ABNORMAL HIGH (ref 0–32)
AST: 45 IU/L — ABNORMAL HIGH (ref 0–40)
Albumin: 4.3 g/dL (ref 3.9–4.9)
Alkaline Phosphatase: 228 IU/L — ABNORMAL HIGH (ref 49–135)
BUN/Creatinine Ratio: 11 — ABNORMAL LOW (ref 12–28)
BUN: 20 mg/dL (ref 8–27)
Bilirubin Total: 0.4 mg/dL (ref 0.0–1.2)
CO2: 15 mmol/L — ABNORMAL LOW (ref 20–29)
Calcium: 9.7 mg/dL (ref 8.7–10.3)
Chloride: 110 mmol/L — ABNORMAL HIGH (ref 96–106)
Creatinine, Ser: 1.79 mg/dL — ABNORMAL HIGH (ref 0.57–1.00)
Globulin, Total: 3.2 g/dL (ref 1.5–4.5)
Glucose: 85 mg/dL (ref 70–99)
Potassium: 5 mmol/L (ref 3.5–5.2)
Sodium: 143 mmol/L (ref 134–144)
Total Protein: 7.5 g/dL (ref 6.0–8.5)
eGFR: 30 mL/min/1.73 — ABNORMAL LOW (ref >59)

## 2024-03-19 LAB — IRON,TIBC AND FERRITIN PANEL
Ferritin: 91 ng/mL (ref 15–150)
Iron Saturation: 20 % (ref 15–55)
Iron: 71 ug/dL (ref 27–139)
Total Iron Binding Capacity: 361 ug/dL (ref 250–450)
UIBC: 290 ug/dL (ref 118–369)

## 2024-03-20 ENCOUNTER — Ambulatory Visit: Payer: Self-pay | Admitting: Physician Assistant

## 2024-03-20 LAB — PTH-RELATED PEPTIDE: PTH-related peptide: <2 pmol/L

## 2024-03-20 NOTE — Progress Notes (Signed)
 Marissa Willis,   How much vitamin D  are you taking right now?   Hemoglobin stable with slightly improvement. Serum iron looks good.  Calcium  normal range.  Potassium and sodium normal range.  Kidney function is improving, GREAT news.

## 2024-03-24 ENCOUNTER — Telehealth: Payer: Self-pay

## 2024-03-24 NOTE — Patient Instructions (Signed)
 Visit Information  Thank you for taking time to visit with me today. Please don't hesitate to contact me if I can be of assistance to you before our next scheduled telephone appointment.  Our next appointment is by telephone on 03/31/24 in the afternoon   Following is a copy of your care plan:   Goals Addressed             This Visit's Progress    VBCI Transitions of Care (TOC) Care Plan       Problems:  Recent Hospitalization for treatment of AKI (acute kidney injury) w/ 9/21 EGD  Monitor for alcohol withdrawal - patient states she was previously agitated - last drink was 03/02/24 - was drinking 3 glasses of Kahlua and Tequila, decreased to 2 glasses, then 1 and now is not drinking   Goal:  Over the next 30 days, the patient will not experience hospital readmission  Interventions:  Transitions of Care: Doctor Visits  - discussed the importance of doctor visits Reviewed MD appts - PCP 03/18/24, endo 03/20/24, nepho 03/26/24  Reviewed medications and communication with Mds about restarting paused medication 03/24/24 Update:  Patient had hospital f/u PCP 10/1 140/70 - Per visit note: "Kidney function improved post-discharge(GFR 17 at admission and 24 at discharge, baseline was 54); continued monitoring required. Dialysis considered but not initiated due to improvement.:" Lab results follow up note sent to patient by PCP 10/3:  Debbie,  How much vitamin D  are you taking right now?    Hemoglobin stable with slightly improvement. Serum iron looks good.  Calcium  normal range.  Potassium and sodium normal range.  Kidney function is improving, GREAT news.  - Patient states she is not drinking at all with no ill effects - patient states she continues with stuffy feeling in right ear and states MDs have not found anything with it and it's only affecting my singing - she'd previously thought it was inner ear issue but nothing found and was given antibiotics and meclizine  Medication review was  completed and patient states she is taking Vit D now. Not listed in medications - TOC RN sent EPIC message via secure chat to PCP as follows: GM - talking to patient - Do you want her taking Vitamin D ? She saw Endo 10/3 who told her Vit D level was good. She's just not sure what to do and it's not in EPIC med list  fyi - patient states she is taking Vitamin D  50mcg 2000units daily in addition to Miralax daily in the am Response from PCP, Vermell Bologna - stay on vitamin D  for her bone health and to keep levels good 2000 units is fine!  Patient states she showered herself and feels she is doing well.   Patient Self Care Activities:  Attend all scheduled provider appointments Call pharmacy for medication refills 3-7 days in advance of running out of medications Call provider office for new concerns or questions  Notify RN Care Manager of [facility] call rescheduling needs Participate in Transition of Care Program/Attend TOC scheduled calls Take medications as prescribed    Plan:  Telephone follow up appointment with care management team member scheduled for:  03/31/24 in the afternoon  The patient has been provided with contact information for the care management team and has been advised to call with any health related questions or concerns.         Patient verbalizes understanding of instructions and care plan provided today and agrees to view in MyChart. Active MyChart  status and patient understanding of how to access instructions and care plan via MyChart confirmed with patient.     Telephone follow up appointment with care management team member scheduled for: 03/31/24 The patient has been provided with contact information for the care management team and has been advised to call with any health related questions or concerns.   Please call the care guide team at [phone] if you need to cancel or reschedule your appointment.   Please call the Suicide and Crisis Lifeline: 988 call  1-800-273-TALK (toll free, 24 hour hotline) call 911 if you are experiencing a Mental Health or Behavioral Health Crisis or need someone to talk to.  Shona Prow RN, CCM [place]  VBCI-Population Health RN Care Manager [phone]

## 2024-03-24 NOTE — Transitions of Care (Post Inpatient/ED Visit) (Signed)
 Transition of Care week 2  Visit Note  03/24/2024  Name: Marissa Willis MRN: [id number]          DOB: Dec 29, 1955  Situation: Patient enrolled in [facility] 30-day program. Visit completed with patient by telephone.   Background: Admit/Discharge Date  9/19 -  9/27  [name]   Primary Diagnosis:  AKI (acute kidney injury) w/ 9/21 EGD   Initial Transition Care Management Follow-up Telephone Call    Past Medical History:  Diagnosis Date   Age related osteoporosis 09/09/2019   Arthritis    Asthma with COPD (HCC)    Chronic kidney disease    Cirrhosis of liver (HCC) 03/04/2024   Complex partial seizure disorder [facility]) neurologist-  dr onita   dx 2014  after x2 seiaures march and sept 2014  -- on keppra    Gastroesophageal reflux disease with esophagitis without hemorrhage 03/04/2024   Heart murmur    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 side endovascular coiling cavenous sinus region   History of kidney stones    History of partial seizures    x2 seizure in 2014  dx complex parital seizures started on keppra -- per pt no seizures since off keppra    Hyperlipidemia    Hypertension    Memory changes 09/09/2019   OSA on CPAP    Pre-diabetes 09/24/2023   Pulmonary nodules 11/22/2021   Retained ureteral stent    right side migrated   Sleep apnea    SUI (stress urinary incontinence, female)    mild   Vitamin D  insufficiency 09/23/2023    Assessment: Patient Reported Symptoms: Cognitive Cognitive Status: No symptoms reported, Normal speech and language skills, Alert and oriented to person, place, and time      Neurological Neurological Review of Symptoms: No symptoms reported    HEENT HEENT Symptoms Reported: Other: (patient states she continues with stuffy feeling in right ear and states MDs have not found anything with it and it's affecting my singing - she'd previously thought it was inner ear issue but nothing found and was given antibiotics and meclizine ) HEENT  Comment: Patient states she had xrays of head/neck - nothing found - and states she no longer wants to pursue this - started early Sept    Cardiovascular Cardiovascular Symptoms Reported: Swelling in legs or feet (Patient states she has mild swelling and feels it's because of sitting - educated to elevate legs with sitting) Weight: 204 lb (92.5 kg) (patient reports 204lbs at Dr Micheline office 03/20/24)  Respiratory Respiratory Symptoms Reported: No symptoms reported    Endocrine Endocrine Symptoms Reported: No symptoms reported    Gastrointestinal Gastrointestinal Symptoms Reported: No symptoms reported      Genitourinary Genitourinary Symptoms Reported: No symptoms reported    Integumentary Integumentary Symptoms Reported: No symptoms reported    Musculoskeletal Musculoskelatal Symptoms Reviewed: No symptoms reported Musculoskeletal Self-Management Outcome: 4 (good) Falls in the past year?: No    Psychosocial Psychosocial Symptoms Reported: No symptoms reported         There were no vitals filed for this visit.  Medications Reviewed Today     Reviewed by Lauro Shona LABOR, RN (Registered Nurse) on 03/24/24 at 1056  Med List Status: <None>   Medication Order Taking? Sig Documenting Provider Last Dose Status Informant  Albuterol  Sulfate (PROAIR  RESPICLICK) 108 (90 Base) MCG/ACT AEPB [id number] Yes Inhale 2 puffs into the lungs every 6 (six) hours as needed (shortness of breath/wheezing). Breeback, Jade L, PA-C  Active Self, Pharmacy  Records  dicyclomine  (BENTYL ) 10 MG capsule [id number]  Take 1 capsule (10 mg total) by mouth 4 (four) times daily -  before meals and at bedtime.  Patient not taking: Reported on 03/24/2024   Barbarann Nest, MD  Active   fluticasone  (FLONASE ) 50 MCG/ACT nasal spray [id number] Yes Place 1 spray into both nostrils daily. Antoniette Vermell CROME, PA-C  Active Self, Pharmacy Records  lisinopril -hydrochlorothiazide  (ZESTORETIC ) 20-25 MG tablet [id number]  Take 1 tablet  by mouth daily.  Patient not taking: Reported on 03/24/2024   Breeback, Jade L, PA-C  Active Self, Pharmacy Records  Multiple Vitamin (MULTIVITAMIN) capsule [id number] Yes Take 1 capsule by mouth daily. [provider]  Active Self, Pharmacy Records  phosphorus (K PHOS  NEUTRAL) 155-852-130 MG tablet [id number] Yes Take 2 tablets (500 mg total) by mouth daily. Barbarann Nest, MD  Active   ZYPITAMAG  4 MG TABS [id number] Yes Take 1 tablet (4 mg total) by mouth daily. Breeback, Jade L, PA-C  Active Self, Pharmacy Records            Recommendation:   Continue Current Plan of Care  Follow Up Plan:   Telephone follow up appointment date/time:  03/31/24 in the afternoon  Shona Prow RN, CCM [place]  VBCI-Population Health RN Care Manager [phone]

## 2024-03-27 LAB — LAB REPORT - SCANNED
Albumin, Urine POC: 71.1
Albumin/Creatinine Ratio, Urine, POC: 121
Creatinine, POC: 58.7 mg/dL

## 2024-03-31 ENCOUNTER — Telehealth: Payer: Self-pay

## 2024-03-31 NOTE — Transitions of Care (Post Inpatient/ED Visit) (Signed)
 Transition of Care week 3  Visit Note  03/31/2024  Name: Marissa Willis MRN: [id number]          DOB: 1955/10/07  Situation: Patient enrolled in [facility] 30-day program. Visit completed with patient by telephone.   Background: Admit/Discharge Date  9/19 -  9/27  Marissa Willis  Primary Diagnosis:  AKI (acute kidney injury) w/ 9/21 EGD   Initial Transition Care Management Follow-up Telephone Call Discharge Date and Diagnosis: 03/14/24, AKI (acute kidney injury) w/ 9/21 EGD   Past Medical History:  Diagnosis Date   Age related osteoporosis 09/09/2019   Arthritis    Asthma with COPD (HCC)    Chronic kidney disease    Cirrhosis of liver (HCC) 03/04/2024   Complex partial seizure disorder [facility]) neurologist-  dr onita   dx 2014  after x2 seiaures march and sept 2014  -- on keppra    Gastroesophageal reflux disease with esophagitis without hemorrhage 03/04/2024   Heart murmur    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 side endovascular coiling cavenous sinus region   History of kidney stones    History of partial seizures    x2 seizure in 2014  dx complex parital seizures started on keppra -- per pt no seizures since off keppra    Hyperlipidemia    Hypertension    Memory changes 09/09/2019   OSA on CPAP    Pre-diabetes 09/24/2023   Pulmonary nodules 11/22/2021   Retained ureteral stent    right side migrated   Sleep apnea    SUI (stress urinary incontinence, female)    mild   Vitamin D  insufficiency 09/23/2023    Assessment: Patient Reported Symptoms: Cognitive Cognitive Status: No symptoms reported, Normal speech and language skills, Alert and oriented to person, place, and time      Neurological      HEENT HEENT Symptoms Reported: No symptoms reported HEENT Comment: patient states she does not have the stuffiness in the right ear today    Cardiovascular Cardiovascular Symptoms Reported: Swelling in legs or feet (patient states her legs are a little swollen - was out  picking pecans - understands importance of elevating)    Respiratory Respiratory Symptoms Reported: No symptoms reported    Endocrine Endocrine Symptoms Reported: No symptoms reported    Gastrointestinal Gastrointestinal Symptoms Reported: No symptoms reported      Genitourinary Genitourinary Symptoms Reported: No symptoms reported    Integumentary Integumentary Symptoms Reported: No symptoms reported    Musculoskeletal Musculoskelatal Symptoms Reviewed: No symptoms reported        Psychosocial Psychosocial Symptoms Reported: No symptoms reported         There were no vitals filed for this visit.  Medications Reviewed Today     Reviewed by Marissa Shona LABOR, RN (Registered Nurse) on 03/31/24 at 1405  Med List Status: <None>   Medication Order Taking? Sig Documenting Provider Last Dose Status Informant  Albuterol  Sulfate (PROAIR  RESPICLICK) 108 (90 Base) MCG/ACT AEPB [id number] Yes Inhale 2 puffs into the lungs every 6 (six) hours as needed (shortness of breath/wheezing). Antoniette Vermell CROME, PA-C  Active Self, Pharmacy Records  Cholecalciferol (VITAMIN D ) 50 MCG (2000 UT) CAPS [id number] Yes Take 2,000 Units by mouth daily. [provider]  Active   dicyclomine  (BENTYL ) 10 MG capsule [id number]  Take 1 capsule (10 mg total) by mouth 4 (four) times daily -  before meals and at bedtime.  Patient not taking: Reported on 03/31/2024   Barbarann Nest, MD  Active  fluticasone  (FLONASE ) 50 MCG/ACT nasal spray [id number] Yes Place 1 spray into both nostrils daily. Antoniette Vermell CROME, PA-C  Active Self, Pharmacy Records  lisinopril -hydrochlorothiazide  (ZESTORETIC ) 20-25 MG tablet [id number]  Take 1 tablet by mouth daily.  Patient not taking: Reported on 03/31/2024   Breeback, Jade L, PA-C  Active Self, Pharmacy Records  Multiple Vitamin (MULTIVITAMIN) capsule [id number] Yes Take 1 capsule by mouth daily. [provider]  Active Self, Pharmacy Records  phosphorus (K PHOS  NEUTRAL)  155-852-130 MG tablet [id number] Yes Take 2 tablets (500 mg total) by mouth daily. Barbarann Nest, MD  Active   ZYPITAMAG  4 MG TABS [id number] Yes Take 1 tablet (4 mg total) by mouth daily. Breeback, Jade L, PA-C  Active Self, Pharmacy Records            Recommendation:   Continue Current Plan of Care  Follow Up Plan:   Telephone follow up appointment date/time:  04/07/24 in the afternoon  Shona Prow RN, CCM [place]  VBCI-Population Health RN Care Manager [phone]

## 2024-03-31 NOTE — Patient Instructions (Signed)
 Visit Information  Thank you for taking time to visit with me today. Please don't hesitate to contact me if I can be of assistance to you before our next scheduled telephone appointment.  Our next appointment is by telephone on 04/07/24 in the afternoon  Following is a copy of your care plan:   Goals Addressed             This Visit's Progress    VBCI Transitions of Care (TOC) Care Plan       Problems:  Recent Hospitalization for treatment of AKI (acute kidney injury) w/ 9/21 EGD  Monitor for alcohol withdrawal - patient states she was previously agitated - last drink was 03/02/24 - was drinking 3 glasses of Kahlua and Tequila, decreased to 2 glasses, then 1 and now is not drinking   Goal:  Over the next 30 days, the patient will not experience hospital readmission  Interventions:  Transitions of Care: Doctor Visits  - discussed the importance of doctor visits Reviewed MD appts - PCP 03/18/24, endo 03/20/24, nepho 03/26/24  Reviewed medications and communication with Mds about restarting paused medication 03/24/24 Update:  Patient had hospital f/u PCP 10/1 140/70 - Per visit note: "Kidney function improved post-discharge(GFR 17 at admission and 24 at discharge, baseline was 54); continued monitoring required. Dialysis considered but not initiated due to improvement.:" Lab results follow up note sent to patient by PCP 10/3:  Debbie,  How much vitamin D  are you taking right now?    Hemoglobin stable with slightly improvement. Serum iron looks good.  Calcium  normal range.  Potassium and sodium normal range.  Kidney function is improving, GREAT news.  - Patient states she is not drinking at all with no ill effects - patient states she continues with stuffy feeling in right ear and states MDs have not found anything with it and it's only affecting my singing - she'd previously thought it was inner ear issue but nothing found and was given antibiotics and meclizine  Medication review was  completed and patient states she is taking Vit D now. Not listed in medications - TOC RN sent EPIC message via secure chat to PCP as follows: GM - talking to patient - Do you want her taking Vitamin D ? She saw Endo 10/3 who told her Vit D level was good. She's just not sure what to do and it's not in EPIC med list  fyi - patient states she is taking Vitamin D  50mcg 2000units daily in addition to Miralax daily in the am Response from PCP, Vermell Bologna - stay on vitamin D  for her bone health and to keep levels good 2000 units is fine!  Patient states she showered herself and feels she is doing well.  Update 03/31/24: Patient states she is doing well. States she received a call from Dr Anthony office advising her that MD that Lisinopril  was called in without hydrochlorothiazide . Patient states [facility] prior to receiving the call from Dr Anthony office and [facility]'d told [facility] that she didn't think she was supposed to be taking the med. Since being notified by Dr Anthony office, she has called [facility] and is currently awaiting a follow up call from [facility]. [facility] RN discussed note in chart regarding Lung Cancer Screen Program and patient states she isn't interested. States her sister died from lung cancer. Patient states she would not want to pursue treatment if diagnosed. Discussed ACP and patient states she will discuss with husband and MD.  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 Patient states she has been able to quit but she likes drinking and knows she would "overindulge". Patient states her current husband was previously married to an alcoholic and states he quit drinking 8-9 years ago and this makes it easier for her not to drink -  educated on risk of cirrhosis and importance of not drinking and to notify [facility] RN or MD if she feels she needs help with no longer drinking.      Patient Self Care Activities:  Attend all scheduled provider appointments Call pharmacy for medication refills 3-7 days in advance of running out of medications Call provider office for new concerns or questions  Notify RN Care Manager of [facility] call rescheduling needs Participate in Transition of Care Program/Attend TOC scheduled calls Take medications as prescribed    Plan:  Telephone follow up appointment with care management team member scheduled for:  04/07/24 in the afternoon  The patient has been provided with contact information for the care management team and has been advised to call with any health related questions or concerns.         Patient verbalizes understanding of instructions and care plan provided today and agrees to view in MyChart. Active MyChart status and patient understanding of how to access instructions and care plan via MyChart confirmed with patient.     Telephone follow up appointment with care management team member scheduled for:04/07/24 The patient has been provided with contact information for the care management team and has been advised to call with any health related questions or concerns.   Please call the care guide team at [phone] if you need to cancel or reschedule your appointment.   Please call the Suicide and Crisis Lifeline: 988 call 1-800-273-TALK (toll free, 24 hour hotline) call 911 if you are experiencing a Mental Health or Behavioral Health Crisis or need someone to talk to.  Shona Prow RN, CCM [place]  VBCI-Population Health RN Care Manager [phone]

## 2024-04-07 ENCOUNTER — Telehealth: Payer: Self-pay

## 2024-04-07 NOTE — Patient Instructions (Signed)
 Visit Information  Thank you for taking time to visit with me today. Please don't hesitate to contact me if I can be of assistance to you before our next scheduled telephone appointment.  Our next appointment is by telephone on 04/14/24 in the afternoon  Following is a copy of your care plan:   Goals Addressed             This Visit's Progress    VBCI Transitions of Care (TOC) Care Plan       Problems:  Recent Hospitalization for treatment of AKI (acute kidney injury) w/ 9/21 EGD  Monitor for alcohol withdrawal - patient states she was previously agitated - last drink was 03/02/24 - was drinking 3 glasses of Kahlua and Tequila, decreased to 2 glasses, then 1 and now is not drinking   Goal:  Over the next 30 days, the patient will not experience hospital readmission  Interventions:  Transitions of Care: Doctor Visits  - discussed the importance of doctor visits Reviewed MD appts - PCP 03/18/24, endo 03/20/24, nepho 03/26/24  Reviewed medications and communication with Mds about restarting paused medication 03/24/24 Update:  Patient had hospital f/u PCP 10/1 140/70 - Per visit note: "Kidney function improved post-discharge(GFR 17 at admission and 24 at discharge, baseline was 54); continued monitoring required. Dialysis considered but not initiated due to improvement.:" Lab results follow up note sent to patient by PCP 10/3:  Debbie,  How much vitamin D  are you taking right now?    Hemoglobin stable with slightly improvement. Serum iron looks good.  Calcium  normal range.  Potassium and sodium normal range.  Kidney function is improving, GREAT news.  - Patient states she is not drinking at all with no ill effects - patient states she continues with stuffy feeling in right ear and states MDs have not found anything with it and it's only affecting my singing - she'd previously thought it was inner ear issue but nothing found and was given antibiotics and meclizine  Medication review was  completed and patient states she is taking Vit D now. Not listed in medications - TOC RN sent EPIC message via secure chat to PCP as follows: GM - talking to patient - Do you want her taking Vitamin D ? She saw Endo 10/3 who told her Vit D level was good. She's just not sure what to do and it's not in EPIC med list  fyi - patient states she is taking Vitamin D  50mcg 2000units daily in addition to Miralax daily in the am Response from PCP, Vermell Bologna - stay on vitamin D  for her bone health and to keep levels good 2000 units is fine!  Patient states she showered herself and feels she is doing well.  Update 03/31/24: Patient states she is doing well. States she received a call from Dr Anthony office advising her that MD that Lisinopril  was called in without hydrochlorothiazide . [facility] states [facility] prior to receiving the call from Dr Anthony office and [facility]'d [facility] that she didn't think she was supposed to be taking the med. Since being notified by Dr Anthony office, she has called [facility] and is currently awaiting a follow up call from [facility]. [facility] RN discussed note in chart regarding Lung Cancer Screen Program and patient states she isn't interested. States her sister died from lung cancer. Patient states she would not want to pursue treatment if diagnosed. Discussed ACP and patient states she will discuss with husband and MD.  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 Patient states she has been able to quit but she likes drinking and knows she would "overindulge". Patient states her current husband was previously married to an alcoholic and states he quit drinking 8-9 years ago and this makes it easier for her not to drink -  educated on risk of cirrhosis and importance of not drinking and to notify [facility] RN or MD if she feels she needs help with no longer drinking.   Update 04/07/24: Patient states she has not had any Md appts since last call and states she did get her Lisinopril . Patient reported during today's call that she is doing well overall and feels she has improved. Patient states she continues to refrain from smoking and drink. TOC RN discussed option of closing program and patient states she'd like to have 1 more follow up call  - TOC RN provided information about [facility] program for patient to think about prior to our last call scheduled 04/14/24    Patient Self Care Activities:  Attend all scheduled provider appointments Call pharmacy for medication refills 3-7 days in advance of running out of medications Call provider office for new concerns or questions  Notify RN Care Manager of [facility] call rescheduling needs Participate in Transition of Care Program/Attend TOC scheduled calls Take medications as prescribed    Plan:  Telephone follow up appointment with care management team member scheduled for:  04/14/24 in the afternoon  The patient has been provided with contact information for the care management team and has been advised to call with any health related questions or concerns.         Patient verbalizes understanding of instructions and care plan provided today and agrees to view in MyChart. Active MyChart status and patient understanding of how to access instructions and care plan via MyChart confirmed with patient.     Telephone follow up appointment with care management team member scheduled for:04/14/24  The patient has been provided with contact information for the care management team and has been advised to call with any health related questions or concerns.   Please call the care guide team at [phone] if you need to cancel or reschedule your appointment.   Please call the Suicide and  Crisis Lifeline: 988 call 1-800-273-TALK (toll free, 24 hour hotline) call 911 if you are experiencing a Mental Health or Behavioral Health Crisis or need someone to talk to.  Shona Prow RN, CCM [place]  VBCI-Population Health RN Care Manager [phone]

## 2024-04-07 NOTE — Transitions of Care (Post Inpatient/ED Visit) (Signed)
 Transition of Care week 4  Visit Note  04/07/2024  Name: Marissa Willis MRN: [id number]          DOB: 08/10/1955  Situation: Patient enrolled in [facility] 30-day program. Visit completed with patient by telephone.   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   Complex partial seizure disorder [facility]) neurologist-  dr onita   dx 2014  after x2 seiaures march and sept 2014  -- on keppra    Gastroesophageal reflux disease with esophagitis without hemorrhage 03/04/2024   Heart murmur    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 side endovascular coiling cavenous sinus region   History of kidney stones    History of partial seizures    x2 seizure in 2014  dx complex parital seizures started on keppra -- per pt no seizures since off keppra    Hyperlipidemia    Hypertension    Memory changes 09/09/2019   OSA on CPAP    Pre-diabetes 09/24/2023   Pulmonary nodules 11/22/2021   Retained ureteral stent    right side migrated   Sleep apnea    SUI (stress urinary incontinence, female)    mild   Vitamin D  insufficiency 09/23/2023    Assessment: Patient Reported Symptoms: Cognitive Cognitive Status: No symptoms reported, Normal speech and language skills, Alert and oriented to person, place, and time      Neurological      HEENT HEENT Symptoms Reported: No symptoms reported HEENT Comment: patient states the stuffiness in her ear has gotten better    Cardiovascular Cardiovascular Symptoms Reported: Swelling in legs or feet (patient states the swelling is only slight swelling and has  improved)    Respiratory Respiratory Symptoms Reported: No symptoms reported Additional Respiratory Details: Patient reports she had a dream she smoked last night and felt guilt but states she continues to refrain from smoking Respiratory Management Strategies: CPAP, Medication therapy  Endocrine Endocrine Symptoms Reported: No symptoms reported    Gastrointestinal Gastrointestinal Symptoms Reported: No symptoms reported      Genitourinary Genitourinary Symptoms Reported: No symptoms reported    Integumentary Integumentary Symptoms Reported: No symptoms reported    Musculoskeletal Musculoskelatal Symptoms Reviewed: No symptoms reported        Psychosocial Psychosocial Symptoms Reported: No symptoms reported         There were no vitals filed for this visit.  Medications Reviewed Today     Reviewed by Marissa Shona LABOR, RN (Registered Nurse) on 04/07/24 at 1318  Med List Status: <None>   Medication Order Taking? Sig Documenting Provider Last Dose Status Informant  Albuterol  Sulfate (PROAIR  RESPICLICK) 108 (90 Base) MCG/ACT AEPB [id number] Yes Inhale 2 puffs into the lungs every 6 (six) hours as needed (shortness of breath/wheezing). Antoniette Vermell CROME, PA-C  Active Self, Pharmacy Records  Cholecalciferol (VITAMIN D ) 50 MCG (2000 UT) CAPS [id number] Yes Take 2,000 Units by mouth daily. [provider]  Active   dicyclomine  (BENTYL ) 10 MG capsule [id number]  Take 1 capsule (10 mg total) by mouth 4 (four) times daily -  before meals and at bedtime.  Patient not taking: Reported on 04/07/2024   Barbarann Nest, MD  Active   fluticasone  (FLONASE ) 50 MCG/ACT nasal spray [id number]  Place 1 spray into both nostrils daily.  Patient not taking: Reported on 04/07/2024   Antoniette Vermell CROME, PA-C  Active Self, Pharmacy Records  lisinopril  (ZESTRIL ) 20 MG tablet [id number] Yes Take 20 mg by mouth daily. [provider]  Active   lisinopril -hydrochlorothiazide  (ZESTORETIC ) 20-25 MG  tablet [id number]  Take 1 tablet by mouth daily.  Patient not taking: Reported on 03/31/2024   Breeback, Jade L, PA-C  Active Self, Pharmacy Records  Multiple Vitamin (MULTIVITAMIN) capsule [id number] Yes Take 1 capsule by mouth daily. [provider]  Active Self, Pharmacy Records  phosphorus (K PHOS  NEUTRAL) 155-852-130 MG tablet [id number] Yes Take 2 tablets (500 mg total) by mouth daily. Barbarann Nest, MD  Active   ZYPITAMAG  4 MG TABS [id number] Yes Take 1 tablet (4 mg total) by mouth daily. Breeback, Jade L, PA-C  Active Self, Pharmacy Records            Recommendation:   Continue Current Plan of Care  Follow Up Plan:   Telephone follow up appointment date/time:  04/14/24 in the afternoon   Shona Prow RN, CCM [place]  VBCI-Population Health RN Care Manager [phone]

## 2024-04-14 ENCOUNTER — Telehealth: Payer: Self-pay

## 2024-04-14 NOTE — Transitions of Care (Post Inpatient/ED Visit) (Signed)
 Transition of Care Week #5 [facility] Program Closure Note  Visit Note  04/14/2024  Name: Marissa Willis MRN: [id number]          DOB: Dec 17, 1955  Situation: Patient enrolled in [facility] 30-day program. Visit completed with patient by telephone.   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) neurologist-  dr onita   dx 2014  after x2 seiaures march and sept 2014  -- on keppra    Gastroesophageal reflux disease with esophagitis without hemorrhage 03/04/2024   Heart murmur    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 side endovascular coiling cavenous sinus region   History of kidney stones    History of partial seizures    x2 seizure in 2014  dx complex parital seizures started on keppra -- per pt no seizures since off keppra    Hyperlipidemia    Hypertension    Memory changes 09/09/2019   OSA on CPAP    Pre-diabetes 09/24/2023   Pulmonary nodules 11/22/2021   Retained ureteral stent    right side migrated   Sleep apnea    SUI (stress urinary incontinence, female)    mild   Vitamin D  insufficiency 09/23/2023    Assessment: Patient Reported Symptoms: Cognitive Cognitive Status: No symptoms reported, Normal speech and language skills, Alert and oriented to person, place, and time      Neurological Neurological Review of Symptoms: No symptoms reported    HEENT HEENT Symptoms Reported: No symptoms reported      Cardiovascular Cardiovascular Symptoms Reported: Swelling in legs or feet (patient states left ankle still has some swelling)     Respiratory Respiratory Symptoms Reported: No symptoms reported    Endocrine Endocrine Symptoms Reported: No symptoms reported    Gastrointestinal Gastrointestinal Symptoms Reported: No symptoms reported      Genitourinary Genitourinary Symptoms Reported: No symptoms reported    Integumentary Integumentary Symptoms Reported: No symptoms reported    Musculoskeletal Musculoskelatal Symptoms Reviewed: No symptoms reported        Psychosocial Psychosocial Symptoms Reported: No symptoms reported         There were no vitals filed for this visit.  Medications Reviewed Today     Reviewed by Lauro Shona LABOR, RN (Registered Nurse) on 04/14/24 at 1414  Med List Status: <None>   Medication Order Taking? Sig Documenting Provider Last Dose Status Informant  Albuterol  Sulfate (PROAIR  RESPICLICK) 108 (90 Base) MCG/ACT AEPB [id number] Yes Inhale 2 puffs into the lungs every 6 (six) hours as needed (shortness of breath/wheezing). Antoniette Vermell CROME, PA-C  Active Self, Pharmacy Records  Cholecalciferol (VITAMIN D ) 50 MCG (2000 UT) CAPS [id number] Yes Take 2,000 Units by mouth daily. [provider]  Active   dicyclomine  (BENTYL ) 10 MG capsule [id number]  Take 1 capsule (10 mg total) by mouth 4 (four) times daily -  before meals and at bedtime.  Patient not taking: Reported on 04/14/2024   Barbarann Nest, MD  Active   fluticasone  (FLONASE ) 50 MCG/ACT nasal spray [id number]  Place 1 spray  into both nostrils daily.  Patient not taking: Reported on 04/14/2024   Antoniette Vermell CROME, PA-C  Active Self, Pharmacy Records  lisinopril  (ZESTRIL ) 20 MG tablet [id number] Yes Take 20 mg by mouth daily. [provider]  Active   lisinopril -hydrochlorothiazide  (ZESTORETIC ) 20-25 MG tablet [id number]  Take 1 tablet by mouth daily.  Patient not taking: Reported on 03/31/2024   Breeback, Jade L, PA-C  Active Self, Pharmacy Records  Multiple Vitamin (MULTIVITAMIN) capsule [id number] Yes Take 1 capsule by  mouth daily. [provider]  Active Self, Pharmacy Records  phosphorus (K PHOS  NEUTRAL) 155-852-130 MG tablet [id number]  Take 2 tablets (500 mg total) by mouth daily.  Patient not taking: Reported on 04/14/2024   Barbarann Nest, MD  Active   ZYPITAMAG  4 MG TABS [id number] Yes Take 1 tablet (4 mg total) by mouth daily. Antoniette Vermell CROME, PA-C  Active Self, Pharmacy Records            Recommendation:   Patient will follow up with her providers  Follow Up Plan:   Closing From:  Transitions of Care Program  Shona Prow RN, CCM [facility]  VBCI-Population Health RN Care Manager [phone]

## 2024-04-24 ENCOUNTER — Ambulatory Visit (INDEPENDENT_AMBULATORY_CARE_PROVIDER_SITE_OTHER): Admitting: Physician Assistant

## 2024-04-24 ENCOUNTER — Encounter: Payer: Self-pay | Admitting: Physician Assistant

## 2024-04-24 VITALS — BP 125/52 | HR 50 | Ht 62.0 in | Wt 210.0 lb

## 2024-04-24 DIAGNOSIS — M8588 Other specified disorders of bone density and structure, other site: Secondary | ICD-10-CM | POA: Diagnosis not present

## 2024-04-24 DIAGNOSIS — N179 Acute kidney failure, unspecified: Secondary | ICD-10-CM

## 2024-04-24 DIAGNOSIS — G4733 Obstructive sleep apnea (adult) (pediatric): Secondary | ICD-10-CM | POA: Diagnosis not present

## 2024-04-24 DIAGNOSIS — N1831 Chronic kidney disease, stage 3a: Secondary | ICD-10-CM

## 2024-04-24 DIAGNOSIS — I10 Essential (primary) hypertension: Secondary | ICD-10-CM

## 2024-04-24 NOTE — Progress Notes (Signed)
 Established Patient Office Visit  Subjective   Patient ID: Marissa Willis, female    DOB: 04/05/1956  Age: 68 y.o. MRN: [id number]  Chief Complaint  Patient presents with   Medical Management of Chronic Issues    C-pap follow up    HPI .SABRADiscussed the use of AI scribe software for clinical note transcription with the patient, who gave verbal consent to proceed.  History of Present Illness Marissa Willis is a 68 year old female with sleep apnea and osteoporosis who presents for follow-up on her CPAP use and bone health.  Obstructive sleep apnea and cpap therapy - Significant improvement in sleep quality and overall well-being with nightly CPAP use - Unable to sleep without CPAP; brought CPAP to hospital during admission - Recently switched to a new CPAP machine, which is more effective than previous device - Previous CPAP machine had suboptimal function; required purchase of own masks, used each for up to two years - Currently has three CPAP masks available  Osteopenia and bone health - History of osteopenia - Past discussion with endocrinologist regarding potential medication or injections for bone health - Sustained three rib fractures; bone density test was scheduled at that time but not completed - No recent bone density test performed  Musculoskeletal pain - Experiencing hip pain, with increasing discomfort in the right hip - Attempting to increase physical activity by walking more despite hip pain  Hypertension and renal health - Currently taking lisinopril  for blood pressure management - Upcoming appointment with nephrology in December - Plans to have laboratory tests completed at the end of the month   ROS See HPI.    Objective:     BP (!) 125/52   Pulse (!) 50   Ht 5' 2 (1.575 m)   Wt 210 lb (95.3 kg)   SpO2 99%   BMI 38.41 kg/m  BP Readings from Last 3 Encounters:  04/24/24 (!) 125/52  03/18/24 (!) 140/70  03/14/24 (!) 145/57   Wt  Readings from Last 3 Encounters:  04/24/24 210 lb (95.3 kg)  03/24/24 204 lb (92.5 kg)  03/18/24 210 lb (95.3 kg)      Physical Exam Constitutional:      Appearance: Normal appearance. She is obese.  HENT:     Head: Normocephalic.  Cardiovascular:     Rate and Rhythm: Normal rate and regular rhythm.  Pulmonary:     Effort: Pulmonary effort is normal.     Breath sounds: Normal breath sounds.  Musculoskeletal:     Right lower leg: No edema.     Left lower leg: No edema.  Neurological:     General: No focal deficit present.     Mental Status: She is alert and oriented to person, place, and time.  Psychiatric:        Mood and Affect: Mood normal.      The 10-year ASCVD risk score (Arnett DK, et al., 2019) is: 13.8%    Assessment & Plan:  SABRASABRABrihany Willis was seen today for medical management of chronic issues.  Diagnoses and all orders for this visit:  OSA on CPAP  Osteopenia of lumbar spine -     DG Bone Density; Future  Hypercalcemia  AKI (acute kidney injury)  CKD stage 3a, GFR 45-59 ml/min (HCC)   Assessment & Plan Obstructive sleep apnea Well-managed with CPAP therapy. She reported significant symptom improvement and consistent nightly use. The new machine and multiple masks are functioning well. - Continue CPAP therapy nightly. -  Reviewed compliance report with average usage 9 hours and 38 minutes a night on auto pap 5-20cm H20. - Follow up yearly.   Osteopenia Management discussed with endocrinologist. Recent labs showed normal vitamin D  and phosphorus, slightly elevated calcium  at 10.4 mg/dL. - Avoid thyroid  diuretics such as hydrochlorothiazide  and chlorthalidone. - Consume 1200 mg of calcium  daily from diet. - Take at least 800 units of vitamin D  daily. - DEXA scan ordered for follow up.   Hypertension Managed with lisinopril . - Continue lisinopril  for hypertension management.  Hypercalcemia Previous hypercalcemia likely due to parathyroid   issues, now ruled out. Current calcium  levels slightly elevated at 10.4 mg/dL. - Will check calcium  levels during upcoming labs with nephrologist. - Avoid diuretics and consume calcium  in diet, no supplements.   AKI/Chronic kidney disease/HTN Monitored by nephrologist. Recent evaluation showed good progress. - Continue follow-up with nephrologist in December. - Will perform labs at the end of the month as scheduled with nephrologist. - BP to goal today on Lisinopril  20mg  daily.      Marissa Bomkamp, PA-C

## 2024-04-27 ENCOUNTER — Telehealth: Payer: Self-pay

## 2024-04-27 NOTE — Telephone Encounter (Signed)
 C-pap compliance  office notes faxed to [facility] with confirmation

## 2024-04-30 ENCOUNTER — Telehealth: Payer: Self-pay

## 2024-04-30 NOTE — Telephone Encounter (Signed)
 Cpap compliance faxed with confirmation to [facility]
